# Patient Record
Sex: Male | Born: 1940
Health system: Southern US, Community
[De-identification: ages and names within clinical notes are randomized; demographics above are authoritative.]

## PROBLEM LIST (undated history)

## (undated) DIAGNOSIS — S065X9A Traumatic subdural hemorrhage with loss of consciousness of unspecified duration, initial encounter: Secondary | ICD-10-CM

## (undated) DIAGNOSIS — K5790 Diverticulosis of intestine, part unspecified, without perforation or abscess without bleeding: Secondary | ICD-10-CM

## (undated) DIAGNOSIS — I10 Essential (primary) hypertension: Secondary | ICD-10-CM

## (undated) HISTORY — PX: HERNIA REPAIR: SHX51

---

## 2004-02-22 ENCOUNTER — Ambulatory Visit (HOSPITAL_COMMUNITY): Admission: RE | Admit: 2004-02-22 | Discharge: 2004-02-22 | Payer: Self-pay | Admitting: General Surgery

## 2004-02-22 ENCOUNTER — Ambulatory Visit (HOSPITAL_BASED_OUTPATIENT_CLINIC_OR_DEPARTMENT_OTHER): Admission: RE | Admit: 2004-02-22 | Discharge: 2004-02-22 | Payer: Self-pay | Admitting: General Surgery

## 2007-07-26 ENCOUNTER — Encounter: Admission: RE | Admit: 2007-07-26 | Discharge: 2007-07-26 | Payer: Self-pay | Admitting: Family Medicine

## 2010-08-03 ENCOUNTER — Other Ambulatory Visit: Payer: Self-pay | Admitting: Otolaryngology

## 2010-08-03 DIAGNOSIS — R52 Pain, unspecified: Secondary | ICD-10-CM

## 2010-08-05 ENCOUNTER — Inpatient Hospital Stay: Admission: RE | Admit: 2010-08-05 | Payer: Self-pay | Source: Ambulatory Visit

## 2010-08-05 ENCOUNTER — Other Ambulatory Visit: Payer: Self-pay | Admitting: Otolaryngology

## 2010-08-05 ENCOUNTER — Ambulatory Visit
Admission: RE | Admit: 2010-08-05 | Discharge: 2010-08-05 | Disposition: A | Discharge status: Home / Self Care | Payer: Medicare Other | Source: Ambulatory Visit | Attending: Otolaryngology | Admitting: Otolaryngology

## 2010-08-05 DIAGNOSIS — R52 Pain, unspecified: Secondary | ICD-10-CM

## 2010-08-05 MED ORDER — IOHEXOL 300 MG/ML  SOLN
75.0000 mL | Freq: Once | INTRAMUSCULAR | Status: AC | PRN
  Administered 2010-08-05: 75 mL via INTRAVENOUS

## 2011-04-20 DIAGNOSIS — M542 Cervicalgia: Secondary | ICD-10-CM | POA: Diagnosis not present

## 2011-04-20 DIAGNOSIS — M5030 Other cervical disc degeneration, unspecified cervical region: Secondary | ICD-10-CM | POA: Diagnosis not present

## 2011-04-21 DIAGNOSIS — R509 Fever, unspecified: Secondary | ICD-10-CM | POA: Diagnosis not present

## 2011-04-24 ENCOUNTER — Other Ambulatory Visit: Payer: Self-pay | Admitting: Family Medicine

## 2011-04-24 ENCOUNTER — Ambulatory Visit
Admission: RE | Admit: 2011-04-24 | Discharge: 2011-04-24 | Disposition: A | Discharge status: Home / Self Care | Payer: Medicare Other | Source: Ambulatory Visit | Attending: Family Medicine | Admitting: Family Medicine

## 2011-04-24 DIAGNOSIS — R059 Cough, unspecified: Secondary | ICD-10-CM | POA: Diagnosis not present

## 2011-04-24 DIAGNOSIS — R509 Fever, unspecified: Secondary | ICD-10-CM

## 2011-04-24 DIAGNOSIS — R05 Cough: Secondary | ICD-10-CM | POA: Diagnosis not present

## 2011-04-24 DIAGNOSIS — J3489 Other specified disorders of nose and nasal sinuses: Secondary | ICD-10-CM | POA: Diagnosis not present

## 2011-04-25 DIAGNOSIS — R509 Fever, unspecified: Secondary | ICD-10-CM | POA: Diagnosis not present

## 2011-05-10 DIAGNOSIS — S065XAA Traumatic subdural hemorrhage with loss of consciousness status unknown, initial encounter: Secondary | ICD-10-CM

## 2011-05-10 DIAGNOSIS — S065X9A Traumatic subdural hemorrhage with loss of consciousness of unspecified duration, initial encounter: Secondary | ICD-10-CM

## 2011-05-10 HISTORY — DX: Traumatic subdural hemorrhage with loss of consciousness of unspecified duration, initial encounter: S06.5X9A

## 2011-05-10 HISTORY — DX: Traumatic subdural hemorrhage with loss of consciousness status unknown, initial encounter: S06.5XAA

## 2011-05-19 DIAGNOSIS — R51 Headache: Secondary | ICD-10-CM | POA: Diagnosis not present

## 2011-05-22 ENCOUNTER — Encounter (HOSPITAL_COMMUNITY): Payer: Self-pay | Admitting: Certified Registered Nurse Anesthetist

## 2011-05-22 ENCOUNTER — Emergency Department (HOSPITAL_COMMUNITY): Payer: Medicare Other

## 2011-05-22 ENCOUNTER — Encounter (HOSPITAL_COMMUNITY): Payer: Self-pay | Admitting: Emergency Medicine

## 2011-05-22 ENCOUNTER — Inpatient Hospital Stay (HOSPITAL_COMMUNITY)
Admission: EM | Admit: 2011-05-22 | Discharge: 2011-05-26 | DRG: 027 | Disposition: A | Discharge status: Home / Self Care | Payer: Medicare Other | Attending: Neurosurgery | Admitting: Neurosurgery

## 2011-05-22 ENCOUNTER — Encounter (HOSPITAL_COMMUNITY)
Admission: EM | Disposition: A | Discharge status: Home / Self Care | Payer: Self-pay | Source: Home / Self Care | Attending: Neurosurgery

## 2011-05-22 ENCOUNTER — Inpatient Hospital Stay (HOSPITAL_COMMUNITY): Payer: Medicare Other | Admitting: Certified Registered Nurse Anesthetist

## 2011-05-22 DIAGNOSIS — J45909 Unspecified asthma, uncomplicated: Secondary | ICD-10-CM | POA: Diagnosis present

## 2011-05-22 DIAGNOSIS — Z7982 Long term (current) use of aspirin: Secondary | ICD-10-CM | POA: Diagnosis not present

## 2011-05-22 DIAGNOSIS — I6200 Nontraumatic subdural hemorrhage, unspecified: Secondary | ICD-10-CM | POA: Diagnosis not present

## 2011-05-22 DIAGNOSIS — Z79899 Other long term (current) drug therapy: Secondary | ICD-10-CM | POA: Diagnosis not present

## 2011-05-22 DIAGNOSIS — S065X9A Traumatic subdural hemorrhage with loss of consciousness of unspecified duration, initial encounter: Secondary | ICD-10-CM | POA: Diagnosis not present

## 2011-05-22 DIAGNOSIS — R51 Headache: Secondary | ICD-10-CM | POA: Diagnosis not present

## 2011-05-22 DIAGNOSIS — S065XAA Traumatic subdural hemorrhage with loss of consciousness status unknown, initial encounter: Secondary | ICD-10-CM | POA: Diagnosis not present

## 2011-05-22 DIAGNOSIS — E119 Type 2 diabetes mellitus without complications: Secondary | ICD-10-CM | POA: Diagnosis not present

## 2011-05-22 DIAGNOSIS — I10 Essential (primary) hypertension: Secondary | ICD-10-CM | POA: Diagnosis present

## 2011-05-22 HISTORY — DX: Essential (primary) hypertension: I10

## 2011-05-22 HISTORY — DX: Diverticulosis of intestine, part unspecified, without perforation or abscess without bleeding: K57.90

## 2011-05-22 HISTORY — PX: BURR HOLE: SHX908

## 2011-05-22 LAB — CBC
HCT: 36.2 % — ABNORMAL LOW (ref 39.0–52.0)
HCT: 33.4 % — ABNORMAL LOW (ref 39.0–52.0)
Hemoglobin: 12 g/dL — ABNORMAL LOW (ref 13.0–17.0)
Hemoglobin: 11 g/dL — ABNORMAL LOW (ref 13.0–17.0)
MCH: 29.6 pg (ref 26.0–34.0)
MCH: 29.2 pg (ref 26.0–34.0)
MCHC: 33.1 g/dL (ref 30.0–36.0)
MCHC: 32.9 g/dL (ref 30.0–36.0)
MCV: 89.2 fL (ref 78.0–100.0)
MCV: 88.6 fL (ref 78.0–100.0)
Platelets: 197 10*3/uL (ref 150–400)
Platelets: 204 10*3/uL (ref 150–400)
RBC: 4.06 MIL/uL — ABNORMAL LOW (ref 4.22–5.81)
RBC: 3.77 MIL/uL — ABNORMAL LOW (ref 4.22–5.81)
RDW: 14.7 % (ref 11.5–15.5)
RDW: 14.7 % (ref 11.5–15.5)
WBC: 6.8 10*3/uL (ref 4.0–10.5)
WBC: 6.3 10*3/uL (ref 4.0–10.5)

## 2011-05-22 LAB — BASIC METABOLIC PANEL
BUN: 24 mg/dL — ABNORMAL HIGH (ref 6–23)
CO2: 25 mEq/L (ref 19–32)
Calcium: 9.9 mg/dL (ref 8.4–10.5)
Chloride: 98 mEq/L (ref 96–112)
Creatinine, Ser: 1.14 mg/dL (ref 0.50–1.35)
GFR calc Af Amer: 72 mL/min — ABNORMAL LOW (ref >90)
GFR calc non Af Amer: 62 mL/min — ABNORMAL LOW (ref >90)
Glucose, Bld: 146 mg/dL — ABNORMAL HIGH (ref 70–99)
Potassium: 4.4 mEq/L (ref 3.5–5.1)
Sodium: 135 mEq/L (ref 135–145)

## 2011-05-22 LAB — DIFFERENTIAL
Basophils Absolute: 0 10*3/uL (ref 0.0–0.1)
Basophils Relative: 0 % (ref 0–1)
Eosinophils Absolute: 0 10*3/uL (ref 0.0–0.7)
Eosinophils Relative: 0 % (ref 0–5)
Lymphocytes Relative: 9 % — ABNORMAL LOW (ref 12–46)
Lymphs Abs: 0.6 10*3/uL — ABNORMAL LOW (ref 0.7–4.0)
Monocytes Absolute: 0.3 10*3/uL (ref 0.1–1.0)
Monocytes Relative: 5 % (ref 3–12)
Neutro Abs: 5.8 10*3/uL (ref 1.7–7.7)
Neutrophils Relative %: 86 % — ABNORMAL HIGH (ref 43–77)

## 2011-05-22 LAB — PROTIME-INR
INR: 0.9 (ref 0.00–1.49)
INR: 0.96 (ref 0.00–1.49)
Prothrombin Time: 12.3 seconds (ref 11.6–15.2)
Prothrombin Time: 13 seconds (ref 11.6–15.2)

## 2011-05-22 LAB — GLUCOSE, CAPILLARY: Glucose-Capillary: 142 mg/dL — ABNORMAL HIGH (ref 70–99)

## 2011-05-22 LAB — APTT: aPTT: 30 seconds (ref 24–37)

## 2011-05-22 SURGERY — CREATION, CRANIAL BURR HOLE
Anesthesia: General | Site: Head | Laterality: Right | Wound class: Clean

## 2011-05-22 MED ORDER — LABETALOL HCL 5 MG/ML IV SOLN: 10.0000 mg | INTRAVENOUS | Status: DC | PRN

## 2011-05-22 MED ORDER — CEFAZOLIN SODIUM 1-5 GM-% IV SOLN
1.0000 g | Freq: Three times a day (TID) | INTRAVENOUS | Status: DC
  Administered 2011-05-22 – 2011-05-26 (×11): 1 g via INTRAVENOUS
  Filled 2011-05-22 (×16): qty 50

## 2011-05-22 MED ORDER — MICROFIBRILLAR COLL HEMOSTAT EX PADS
MEDICATED_PAD | CUTANEOUS | Status: DC | PRN
  Administered 2011-05-22: 1 via TOPICAL

## 2011-05-22 MED ORDER — ONDANSETRON HCL 4 MG/2ML IJ SOLN: 4.0000 mg | Freq: Once | INTRAMUSCULAR | Status: DC | PRN

## 2011-05-22 MED ORDER — NEOSTIGMINE METHYLSULFATE 1 MG/ML IJ SOLN
INTRAMUSCULAR | Status: DC | PRN
  Administered 2011-05-22: 4 mg via INTRAVENOUS
  Administered 2011-05-22: 1 mg via INTRAVENOUS

## 2011-05-22 MED ORDER — MORPHINE SULFATE 4 MG/ML IJ SOLN
4.0000 mg | Freq: Once | INTRAMUSCULAR | Status: AC
  Administered 2011-05-22: 4 mg via INTRAVENOUS
  Filled 2011-05-22: qty 1

## 2011-05-22 MED ORDER — SODIUM CHLORIDE 0.9 % IV SOLN
INTRAVENOUS | Status: AC
  Filled 2011-05-22: qty 500

## 2011-05-22 MED ORDER — ACETAMINOPHEN 650 MG RE SUPP: 650.0000 mg | RECTAL | Status: DC | PRN

## 2011-05-22 MED ORDER — EPHEDRINE SULFATE 50 MG/ML IJ SOLN
INTRAMUSCULAR | Status: DC | PRN
  Administered 2011-05-22: 10 mg via INTRAVENOUS
  Administered 2011-05-22: 5 mg via INTRAVENOUS
  Administered 2011-05-22 (×2): 10 mg via INTRAVENOUS

## 2011-05-22 MED ORDER — BACITRACIN ZINC 500 UNIT/GM EX OINT
TOPICAL_OINTMENT | CUTANEOUS | Status: DC | PRN
  Administered 2011-05-22: 1 via TOPICAL

## 2011-05-22 MED ORDER — ASPIRIN EC 81 MG PO TBEC
81.0000 mg | DELAYED_RELEASE_TABLET | Freq: Every day | ORAL | Status: DC
  Filled 2011-05-22 (×2): qty 1

## 2011-05-22 MED ORDER — LIDOCAINE-EPINEPHRINE 1 %-1:100000 IJ SOLN
INTRAMUSCULAR | Status: DC | PRN
  Administered 2011-05-22: 3 mL

## 2011-05-22 MED ORDER — ONDANSETRON HCL 4 MG/2ML IJ SOLN: 4.0000 mg | Freq: Four times a day (QID) | INTRAMUSCULAR | Status: DC | PRN

## 2011-05-22 MED ORDER — HYDROMORPHONE HCL PF 1 MG/ML IJ SOLN: 0.2500 mg | INTRAMUSCULAR | Status: DC | PRN

## 2011-05-22 MED ORDER — ALBUTEROL SULFATE HFA 108 (90 BASE) MCG/ACT IN AERS: 2.0000 | INHALATION_SPRAY | Freq: Four times a day (QID) | RESPIRATORY_TRACT | Status: DC | PRN

## 2011-05-22 MED ORDER — ATORVASTATIN CALCIUM 10 MG PO TABS
10.0000 mg | ORAL_TABLET | Freq: Every day | ORAL | Status: DC
  Administered 2011-05-24 – 2011-05-25 (×2): 10 mg via ORAL
  Filled 2011-05-22 (×4): qty 1

## 2011-05-22 MED ORDER — LORATADINE 10 MG PO TABS
10.0000 mg | ORAL_TABLET | Freq: Every day | ORAL | Status: DC
  Administered 2011-05-23 – 2011-05-26 (×4): 10 mg via ORAL
  Filled 2011-05-22 (×4): qty 1

## 2011-05-22 MED ORDER — SITAGLIPTIN PHOS-METFORMIN HCL 50-1000 MG PO TABS: 1.0000 | ORAL_TABLET | Freq: Two times a day (BID) | ORAL | Status: DC

## 2011-05-22 MED ORDER — ADULT MULTIVITAMIN W/MINERALS CH
1.0000 | ORAL_TABLET | Freq: Every day | ORAL | Status: DC
  Administered 2011-05-23 – 2011-05-26 (×4): 1 via ORAL
  Filled 2011-05-22 (×4): qty 1

## 2011-05-22 MED ORDER — GLYCOPYRROLATE 0.2 MG/ML IJ SOLN
INTRAMUSCULAR | Status: DC | PRN
  Administered 2011-05-22: .2 mg via INTRAVENOUS
  Administered 2011-05-22: .6 mg via INTRAVENOUS

## 2011-05-22 MED ORDER — LOSARTAN POTASSIUM 50 MG PO TABS
50.0000 mg | ORAL_TABLET | Freq: Every day | ORAL | Status: DC
  Administered 2011-05-22 – 2011-05-26 (×4): 50 mg via ORAL
  Filled 2011-05-22 (×5): qty 1

## 2011-05-22 MED ORDER — METFORMIN HCL 500 MG PO TABS
1000.0000 mg | ORAL_TABLET | Freq: Two times a day (BID) | ORAL | Status: DC
  Administered 2011-05-23 – 2011-05-26 (×6): 1000 mg via ORAL
  Filled 2011-05-22 (×9): qty 2

## 2011-05-22 MED ORDER — FENTANYL CITRATE 0.05 MG/ML IJ SOLN
INTRAMUSCULAR | Status: DC | PRN
  Administered 2011-05-22: 100 ug via INTRAVENOUS

## 2011-05-22 MED ORDER — BACITRACIN 50000 UNITS IM SOLR
INTRAMUSCULAR | Status: DC | PRN
  Administered 2011-05-22: 19:00:00

## 2011-05-22 MED ORDER — DEXAMETHASONE SODIUM PHOSPHATE 10 MG/ML IJ SOLN
10.0000 mg | Freq: Once | INTRAMUSCULAR | Status: DC
  Filled 2011-05-22: qty 1

## 2011-05-22 MED ORDER — AMOXICILLIN-POT CLAVULANATE 875-125 MG PO TABS
1.0000 | ORAL_TABLET | Freq: Two times a day (BID) | ORAL | Status: DC
  Filled 2011-05-22 (×4): qty 1

## 2011-05-22 MED ORDER — CEFAZOLIN SODIUM 1-5 GM-% IV SOLN
INTRAVENOUS | Status: AC
  Filled 2011-05-22: qty 50

## 2011-05-22 MED ORDER — SODIUM CHLORIDE 0.9 % IV SOLN
INTRAVENOUS | Status: DC | PRN
  Administered 2011-05-22: 19:00:00 via INTRAVENOUS

## 2011-05-22 MED ORDER — NAPROXEN SODIUM 550 MG PO TABS
550.0000 mg | ORAL_TABLET | Freq: Two times a day (BID) | ORAL | Status: DC
  Filled 2011-05-22 (×4): qty 1

## 2011-05-22 MED ORDER — GLIPIZIDE ER 5 MG PO TB24
5.0000 mg | ORAL_TABLET | Freq: Every day | ORAL | Status: DC
  Administered 2011-05-23 – 2011-05-26 (×4): 5 mg via ORAL
  Filled 2011-05-22 (×4): qty 1

## 2011-05-22 MED ORDER — 0.9 % SODIUM CHLORIDE (POUR BTL) OPTIME
TOPICAL | Status: DC | PRN
  Administered 2011-05-22 (×2): 1000 mL

## 2011-05-22 MED ORDER — ACETAMINOPHEN 325 MG PO TABS
325.0000 mg | ORAL_TABLET | Freq: Four times a day (QID) | ORAL | Status: DC | PRN
  Administered 2011-05-23 – 2011-05-24 (×2): 325 mg via ORAL
  Filled 2011-05-22: qty 1

## 2011-05-22 MED ORDER — METOCLOPRAMIDE HCL 5 MG/ML IJ SOLN
10.0000 mg | Freq: Once | INTRAMUSCULAR | Status: DC
  Filled 2011-05-22: qty 2

## 2011-05-22 MED ORDER — CEFAZOLIN SODIUM 1 G IJ SOLR: 1.0000 g | Freq: Three times a day (TID) | INTRAMUSCULAR | Status: DC

## 2011-05-22 MED ORDER — PHENYLEPHRINE HCL 10 MG/ML IJ SOLN
INTRAMUSCULAR | Status: DC | PRN
  Administered 2011-05-22 (×2): 80 ug via INTRAVENOUS

## 2011-05-22 MED ORDER — PROPOFOL 10 MG/ML IV EMUL
INTRAVENOUS | Status: DC | PRN
  Administered 2011-05-22: 20 mg via INTRAVENOUS
  Administered 2011-05-22: 110 mg via INTRAVENOUS

## 2011-05-22 MED ORDER — ROCURONIUM BROMIDE 100 MG/10ML IV SOLN
INTRAVENOUS | Status: DC | PRN
  Administered 2011-05-22: 40 mg via INTRAVENOUS

## 2011-05-22 MED ORDER — THROMBIN 20000 UNITS EX KIT
PACK | CUTANEOUS | Status: DC | PRN
  Administered 2011-05-22: 20:00:00 via TOPICAL

## 2011-05-22 MED ORDER — PANTOPRAZOLE SODIUM 40 MG IV SOLR
40.0000 mg | Freq: Every day | INTRAVENOUS | Status: DC
  Administered 2011-05-22 – 2011-05-23 (×2): 40 mg via INTRAVENOUS
  Filled 2011-05-22 (×3): qty 40

## 2011-05-22 MED ORDER — ACETAMINOPHEN 325 MG PO TABS: 650.0000 mg | ORAL_TABLET | ORAL | Status: DC | PRN

## 2011-05-22 MED ORDER — LIDOCAINE HCL (CARDIAC) 20 MG/ML IV SOLN
INTRAVENOUS | Status: DC | PRN
  Administered 2011-05-22: 60 mg via INTRAVENOUS

## 2011-05-22 MED ORDER — BACITRACIN 50000 UNITS IM SOLR
INTRAMUSCULAR | Status: AC
  Filled 2011-05-22: qty 1

## 2011-05-22 MED ORDER — LINAGLIPTIN 5 MG PO TABS
5.0000 mg | ORAL_TABLET | Freq: Every day | ORAL | Status: DC
  Administered 2011-05-23 – 2011-05-26 (×4): 5 mg via ORAL
  Filled 2011-05-22 (×4): qty 1

## 2011-05-22 MED ORDER — LIDOCAINE HCL 4 % MT SOLN
OROMUCOSAL | Status: DC | PRN
  Administered 2011-05-22: 4 mL via TOPICAL

## 2011-05-22 MED ORDER — LEVOTHYROXINE SODIUM 75 MCG PO TABS
75.0000 ug | ORAL_TABLET | Freq: Every day | ORAL | Status: DC
  Administered 2011-05-23 – 2011-05-26 (×4): 75 ug via ORAL
  Filled 2011-05-22 (×4): qty 1

## 2011-05-22 MED ORDER — SODIUM CHLORIDE 0.9 % IV BOLUS (SEPSIS)
1000.0000 mL | Freq: Once | INTRAVENOUS | Status: AC
  Administered 2011-05-22: 1000 mL via INTRAVENOUS

## 2011-05-22 MED ORDER — SENNOSIDES-DOCUSATE SODIUM 8.6-50 MG PO TABS
1.0000 | ORAL_TABLET | Freq: Two times a day (BID) | ORAL | Status: DC
  Administered 2011-05-22 – 2011-05-26 (×7): 1 via ORAL
  Filled 2011-05-22 (×8): qty 1

## 2011-05-22 MED ORDER — PIOGLITAZONE HCL 30 MG PO TABS
30.0000 mg | ORAL_TABLET | Freq: Every day | ORAL | Status: DC
Start: 2011-05-23 — End: 2011-05-26
  Administered 2011-05-23 – 2011-05-26 (×4): 30 mg via ORAL
  Filled 2011-05-22 (×4): qty 1

## 2011-05-22 MED ORDER — DIPHENHYDRAMINE HCL 50 MG/ML IJ SOLN
25.0000 mg | Freq: Once | INTRAMUSCULAR | Status: DC
  Filled 2011-05-22: qty 1

## 2011-05-22 SURGICAL SUPPLY — 76 items
ADH SKN CLS APL DERMABOND .7 (GAUZE/BANDAGES/DRESSINGS) ×1
BAG DECANTER FOR FLEXI CONT (MISCELLANEOUS) ×2 IMPLANT
BANDAGE GAUZE 4  KLING STR (GAUZE/BANDAGES/DRESSINGS) IMPLANT
BLADE SURG 11 STRL SS (BLADE) ×1 IMPLANT
BLADE SURG ROTATE 9660 (MISCELLANEOUS) ×1 IMPLANT
BNDG COHESIVE 4X5 TAN NS LF (GAUZE/BANDAGES/DRESSINGS) IMPLANT
BRUSH SCRUB EZ PLAIN DRY (MISCELLANEOUS) ×2 IMPLANT
BUR ACORN 9.0 PRECISION (BURR) ×2 IMPLANT
BUR ADDG 1.1 (BURR) IMPLANT
CANISTER SUCTION 2500CC (MISCELLANEOUS) ×3 IMPLANT
CLIP TI MEDIUM 6 (CLIP) IMPLANT
CLOTH BEACON ORANGE TIMEOUT ST (SAFETY) ×2 IMPLANT
CONT SPEC 4OZ CLIKSEAL STRL BL (MISCELLANEOUS) ×2 IMPLANT
CORDS BIPOLAR (ELECTRODE) ×2 IMPLANT
DECANTER SPIKE VIAL GLASS SM (MISCELLANEOUS) ×2 IMPLANT
DERMABOND ADVANCED (GAUZE/BANDAGES/DRESSINGS) ×1
DERMABOND ADVANCED .7 DNX12 (GAUZE/BANDAGES/DRESSINGS) ×1 IMPLANT
DRAPE NEUROLOGICAL W/INCISE (DRAPES) ×1 IMPLANT
DRAPE SURG 17X23 STRL (DRAPES) IMPLANT
DRAPE WARM FLUID 44X44 (DRAPE) ×2 IMPLANT
DRSG OPSITE 4X5.5 SM (GAUZE/BANDAGES/DRESSINGS) ×3 IMPLANT
ELECT CAUTERY BLADE 6.4 (BLADE) ×2 IMPLANT
ELECT REM PT RETURN 9FT ADLT (ELECTROSURGICAL) ×2
ELECTRODE REM PT RTRN 9FT ADLT (ELECTROSURGICAL) ×1 IMPLANT
GAUZE SPONGE 4X4 16PLY XRAY LF (GAUZE/BANDAGES/DRESSINGS) IMPLANT
GLOVE BIO SURGEON STRL SZ 6.5 (GLOVE) ×3 IMPLANT
GLOVE BIO SURGEON STRL SZ7 (GLOVE) IMPLANT
GLOVE BIO SURGEON STRL SZ7.5 (GLOVE) IMPLANT
GLOVE BIO SURGEON STRL SZ8 (GLOVE) ×2 IMPLANT
GLOVE BIO SURGEON STRL SZ8.5 (GLOVE) IMPLANT
GLOVE BIOGEL M 8.0 STRL (GLOVE) IMPLANT
GLOVE ECLIPSE 6.5 STRL STRAW (GLOVE) IMPLANT
GLOVE ECLIPSE 7.0 STRL STRAW (GLOVE) IMPLANT
GLOVE ECLIPSE 7.5 STRL STRAW (GLOVE) IMPLANT
GLOVE ECLIPSE 8.0 STRL XLNG CF (GLOVE) IMPLANT
GLOVE ECLIPSE 8.5 STRL (GLOVE) IMPLANT
GLOVE EXAM NITRILE LRG STRL (GLOVE) IMPLANT
GLOVE EXAM NITRILE MD LF STRL (GLOVE) ×1 IMPLANT
GLOVE EXAM NITRILE XL STR (GLOVE) IMPLANT
GLOVE EXAM NITRILE XS STR PU (GLOVE) IMPLANT
GLOVE INDICATOR 6.5 STRL GRN (GLOVE) ×2 IMPLANT
GLOVE INDICATOR 7.0 STRL GRN (GLOVE) IMPLANT
GLOVE INDICATOR 7.5 STRL GRN (GLOVE) IMPLANT
GLOVE INDICATOR 8.0 STRL GRN (GLOVE) IMPLANT
GLOVE INDICATOR 8.5 STRL (GLOVE) ×2 IMPLANT
GLOVE OPTIFIT SS 8.0 STRL (GLOVE) IMPLANT
GLOVE SURG SS PI 6.5 STRL IVOR (GLOVE) IMPLANT
GOWN BRE IMP SLV AUR LG STRL (GOWN DISPOSABLE) ×2 IMPLANT
GOWN BRE IMP SLV AUR XL STRL (GOWN DISPOSABLE) ×2 IMPLANT
GOWN STRL REIN 2XL LVL4 (GOWN DISPOSABLE) ×2 IMPLANT
HEMOSTAT SURGICEL 2X14 (HEMOSTASIS) ×1 IMPLANT
HOOK DURA (MISCELLANEOUS) ×1 IMPLANT
KIT BASIN OR (CUSTOM PROCEDURE TRAY) ×2 IMPLANT
KIT ROOM TURNOVER OR (KITS) ×2 IMPLANT
NDL HYPO 25X1 1.5 SAFETY (NEEDLE) ×1 IMPLANT
NEEDLE HYPO 25X1 1.5 SAFETY (NEEDLE) ×2 IMPLANT
NS IRRIG 1000ML POUR BTL (IV SOLUTION) ×3 IMPLANT
PACK CRANIOTOMY (CUSTOM PROCEDURE TRAY) ×2 IMPLANT
PAD ARMBOARD 7.5X6 YLW CONV (MISCELLANEOUS) ×6 IMPLANT
PATTIES SURGICAL .25X.25 (GAUZE/BANDAGES/DRESSINGS) IMPLANT
PATTIES SURGICAL .5 X.5 (GAUZE/BANDAGES/DRESSINGS) IMPLANT
PATTIES SURGICAL .5 X3 (DISPOSABLE) IMPLANT
PATTIES SURGICAL 1X1 (DISPOSABLE) IMPLANT
PIN MAYFIELD SKULL DISP (PIN) IMPLANT
SPONGE GAUZE 4X4 12PLY (GAUZE/BANDAGES/DRESSINGS) ×2 IMPLANT
SPONGE NEURO XRAY DETECT 1X3 (DISPOSABLE) IMPLANT
SPONGE SURGIFOAM ABS GEL 100 (HEMOSTASIS) ×1 IMPLANT
STAPLER VISISTAT 35W (STAPLE) ×2 IMPLANT
SUT NURALON 4 0 TR CR/8 (SUTURE) ×2 IMPLANT
SUT VIC AB 2-0 CT1 18 (SUTURE) ×2 IMPLANT
SYR 20ML ECCENTRIC (SYRINGE) ×2 IMPLANT
SYR CONTROL 10ML LL (SYRINGE) ×2 IMPLANT
TOWEL OR 17X24 6PK STRL BLUE (TOWEL DISPOSABLE) ×2 IMPLANT
TOWEL OR 17X26 10 PK STRL BLUE (TOWEL DISPOSABLE) ×2 IMPLANT
TRAY FOLEY CATH 14FRSI W/METER (CATHETERS) ×1 IMPLANT
WATER STERILE IRR 1000ML POUR (IV SOLUTION) ×2 IMPLANT

## 2011-05-22 NOTE — ED Provider Notes (Signed)
History     CSN: [id number]  Arrival date & time 05/22/11  1118   First MD Initiated Contact with Patient 05/22/11 1232      Chief Complaint  Patient presents with  . Headache    (Consider location/radiation/quality/duration/timing/severity/associated sxs/prior treatment) HPI History provided by pt.   Pt has had a constant, progressively worsening headache x 6 days.  Pain diffuse but worst in frontal region.  Aggravated by bending over.  Associated w/ dizziness, described as feeling off balance, today.  His family noted him to be unsteady on his feet.  Denies blurred vision, dysarthria, dysphagia, confusion, extremity weakness/paresthesias.  No fever, nasal congestion, rhinorrhea, rash.  His family reports nml mentation.  Pt seen by his PCP 4 days ago and prescribed augmentin and naproxen for possible sinusitis.  Pt has had no relief w/ these medications.  Was seen again this morning and referred to ED for CT.  Patient's daughter an endocrinologist and she would like for patient to have an LP.  He returned from [place] a little over a month ago and had a viral illness for approx 2wks afterwards that his PCP treated w/ cipro. No recent head trauma.  No h/o headaches.  Is not anti-coagulated.    Past Medical History  Diagnosis Date  . Diabetes mellitus   . Hypertension   . Asthma   . Diverticular disease     Past Surgical History  Procedure Date  . Hernia repair     No family history on file.  History  Substance Use Topics  . Smoking status: Former Games developer  . Smokeless tobacco: Not on file  . Alcohol Use: No      Review of Systems  All other systems reviewed and are negative.    Allergies  Review of patient's allergies indicates no known allergies.  Home Medications   Current Outpatient Rx  Name Route Sig Dispense Refill  . ACETAMINOPHEN 325 MG PO TABS Oral Take 325 mg by mouth every 6 (six) hours as needed. For pain    . ALBUTEROL SULFATE HFA 108 (90 BASE)  MCG/ACT IN AERS Inhalation Inhale 2 puffs into the lungs every 6 (six) hours as needed. For shortness of breath    . AMOXICILLIN-POT CLAVULANATE 875-125 MG PO TABS Oral Take 1 tablet by mouth 2 (two) times daily.    . ASPIRIN EC 81 MG PO TBEC Oral Take 81 mg by mouth daily.    [name] Kitchen CLOBETASOL PROPIONATE 0.05 % EX GEL Topical Apply 1 application topically daily as needed. To affected area    . FEXOFENADINE HCL 180 MG PO TABS Oral Take 180 mg by mouth daily as needed. For allergies    . GLIPIZIDE ER 5 MG PO TB24 Oral Take 5 mg by mouth daily.    [name] Kitchen LEVOTHYROXINE SODIUM 75 MCG PO TABS Oral Take 75 mcg by mouth daily.    [name] Kitchen LOSARTAN POTASSIUM 50 MG PO TABS Oral Take 50 mg by mouth daily.    . ADULT MULTIVITAMIN W/MINERALS CH Oral Take 1 tablet by mouth daily.    [name] Kitchen NAPROXEN SODIUM 220 MG PO TABS Oral Take 220 mg by mouth 2 (two) times daily with a meal. For pain    . NAPROXEN SODIUM 550 MG PO TABS Oral Take 550 mg by mouth 2 (two) times daily with a meal.    . PIOGLITAZONE HCL 30 MG PO TABS Oral Take 30 mg by mouth daily.    [name] Kitchen ROSUVASTATIN CALCIUM 5 MG PO TABS Oral  Take 5 mg by mouth daily.    [name] Kitchen SITAGLIPTIN-METFORMIN HCL 50-1000 MG PO TABS Oral Take 1 tablet by mouth 2 (two) times daily with a meal.      BP 129/71  Pulse 78  Temp(Src) 97.7 F (36.5 C) (Oral)  Resp 20  SpO2 100%  Physical Exam  Nursing note and vitals reviewed. Constitutional: He is oriented to person, place, and time. He appears well-developed and well-nourished. No distress.  HENT:  Head: Normocephalic and atraumatic.       Tenderness of entire frontal scalp, including frontal sinuses and temples.  Temporal arteries are not palpable.  No jaw claudication.   Eyes:       Normal appearance  Neck: Normal range of motion.       No meningeal signs but patient reports that pain is aggravated by both neck and bilateral hip flexion.   Cardiovascular: Normal rate, regular rhythm and intact distal pulses.   Pulmonary/Chest: Effort  normal and breath sounds normal.  Musculoskeletal: Normal range of motion.  Neurological: He is alert and oriented to person, place, and time. No sensory deficit. Coordination normal.       CN 3-12 intact.  No nystagmus. 5/5 and equal upper and lower extremity strength.  No past pointing.     Skin: Skin is warm and dry. No rash noted.  Psychiatric: He has a normal mood and affect. His behavior is normal.    ED Course  Procedures (including critical care time)  Labs Reviewed  GLUCOSE, CAPILLARY - Abnormal; Notable for the following:    Glucose-Capillary 142 (*)    All other components within normal limits  CBC - Abnormal; Notable for the following:    RBC 4.06 (*)    Hemoglobin 12.0 (*)    HCT 36.2 (*)    All other components within normal limits  DIFFERENTIAL - Abnormal; Notable for the following:    Neutrophils Relative 86 (*)    Lymphocytes Relative 9 (*)    Lymphs Abs 0.6 (*)    All other components within normal limits  APTT  PROTIME-INR  BASIC METABOLIC PANEL   Ct Head Wo Contrast  05/22/2011  *RADIOLOGY REPORT*  Clinical Data: Progressive headaches for 1 month.  Recent travel to [place].  On antibiotic therapy.  No history of trauma or anticoagulation.  CT HEAD WITHOUT CONTRAST  Technique:  Contiguous axial images were obtained from the base of the skull through the vertex without contrast.  Comparison: CT temporal bone 08/05/2010.  Findings: There is a nearly isodense subdural hematoma on the right layering over the right frontal, temporal and parietal lobes.  This measures up to 1.3 cm in thickness. There is a suspected smaller contralateral subdural hematoma measuring up to 6 mm in diameter. There is only 3 mm of right-to-left midline shift.  However, there is significant effacement of the subarachnoid spaces bilaterally. There is no hydrocephalus or evidence of intraparenchymal hematoma. There is evidence of acute infarction.  The visualized paranasal sinuses are clear.  The  calvarium is intact.  IMPRESSION: Suspected right greater than left bilateral subdural hematomas, nearly isodense to cerebral cortex with associated mass effect on the gyri and effacement of the sulci.  There is mild midline shift. Brain MRI may be helpful to confirm the bilateral nature of this process.  Neurosurgical consultation recommended.  Critical Value/emergent results were called by telephone at the time of interpretation on 05/22/2011  at 1405 hours  to  [facility], who verbally acknowledged these results.  Original Report Authenticated By: Gerrianne Scale, M.D.     1. Subdural hematoma       MDM  70yo M presents w/ progressively worsening headache, worst in frontal region, for the past 6 days.  No trauma.  Afebrile, no focal neuro deficits or meningeal signs on exam.  CT head shows nearly isodense, bilateral subdural hematomas, right worse than left, with mild mid-line shift.  Coags w/in nml range.  Results discussed w/ pt.  Consulted Dr. Wynetta Emery w/ NS and he will admit patient and take to OR for burr holes.  Pt has had some relief of pain w/ morphine.         Arie Sabina [place], [place] 05/23/11 [phone]

## 2011-05-22 NOTE — ED Notes (Signed)
3102-01 Ready 

## 2011-05-22 NOTE — Anesthesia Procedure Notes (Signed)
Procedure Name: Intubation Date/Time: 05/22/2011 7:21 PM Performed by: Glendora Score A Pre-anesthesia Checklist: Patient identified, Emergency Drugs available, Suction available and Patient being monitored Patient Re-evaluated:Patient Re-evaluated prior to inductionOxygen Delivery Method: Circle system utilized Preoxygenation: Pre-oxygenation with 100% oxygen Intubation Type: IV induction Ventilation: Mask ventilation without difficulty and Oral airway inserted - appropriate to patient size Laryngoscope Size: Hyacinth Meeker and 2 Grade View: Grade I Tube type: Oral Tube size: 8.0 mm Number of attempts: 1 Airway Equipment and Method: Stylet and LTA kit utilized Placement Confirmation: ETT inserted through vocal cords under direct vision,  positive ETCO2 and breath sounds checked- equal and bilateral Secured at: 23 cm Tube secured with: Tape Dental Injury: Teeth and Oropharynx as per pre-operative assessment

## 2011-05-22 NOTE — Anesthesia Postprocedure Evaluation (Signed)
  Anesthesia Post-op Note  Patient: James Juarez  Procedure(s) Performed: Procedure(s) (LRB): BURR HOLES (Right)  Patient Location: PACU  Anesthesia Type: General  Level of Consciousness: awake, alert  and oriented  Airway and Oxygen Therapy: Patient Spontanous Breathing and Patient connected to nasal cannula oxygen  Post-op Pain: none  Post-op Assessment: Post-op Vital signs reviewed and Patient's Cardiovascular Status Stable  Post-op Vital Signs: stable  Complications: No apparent anesthesia complications

## 2011-05-22 NOTE — Preoperative (Signed)
Beta Blockers   Reason not to administer Beta Blockers:Not Applicable 

## 2011-05-22 NOTE — ED Notes (Signed)
Had traveled to [place] about 1 mont ago had a fever and chills and h/a and body aces has been on antibiotics x 2 but still having headache has gotten worse ,dizzy  went to see his dr today and  Was sent for further tests

## 2011-05-22 NOTE — Transfer of Care (Signed)
Immediate Anesthesia Transfer of Care Note  Patient: James Juarez  Procedure(s) Performed: Procedure(s) (LRB): BURR HOLES (Right)  Patient Location: PACU  Anesthesia Type: General  Level of Consciousness: awake and patient cooperative  Airway & Oxygen Therapy: Patient Spontanous Breathing and Patient connected to nasal cannula oxygen  Post-op Assessment: Report given to PACU RN  Post vital signs: Reviewed and stable  Complications: No apparent anesthesia complications

## 2011-05-22 NOTE — Op Note (Signed)
Preoperative diagnosis: Right subacute subdural hematoma  Postoperative diagnosis: Same   Procedure: Ines Bloomer hole craniectomy for evacuation of right-sided subacute subdural hematoma placement of a 7 mm JP drain  Surgeon: Jillyn Hidden Nekeisha Aure  Anesthesia: General  EBL: Minimal  History of present illness: Patient is a 29 or gentleman presented emergent R. with headaches progress to her so the last several days this in the ER with with a CT scan showed a large subacute subdural hematoma with midline shift the patient recommended burr hole craniectomy extensively the risks benefits of the operation with the patient and family as well as perioperative course and expectations of outcome and alternatives to surgery and administered a grade to proceed forward.  Operative procedure: Patient brought into the or was induced under general anesthesia and positioned supine with a shoulder bump under his right shoulder his head turned the left exposing the right convexity to burr hole sites were drawn out and infiltrated with 10 cc lidocaine with epi after the head was shaved prepped and draped in routine sterile fashion. Were also drilled a high-speed drill dura was then coagulated and incised in a cruciate fashion her at first incising the posterior parietal bur hole large amount of dark appearing old blood came out under pressure it was a subdural membrane visualized underneath it this was coagulated and incised in so I could visualize cortex. Frontal bur hole was then opened up and a similar fashion also incising the subdural membranes I could visualize the  right frontal lobe. Then using a rib catheter irrigated the front suctioned from the parietal in the sub-be corrected that she was irrigated until clear irrigant came out and no more blood is appreciated then a J-P drain was cut sized and passed over a 3 [place] to confirm migration only and the subdural space and this point posterior parietal hole was closed with  interrupted vertical staples in the left frontal burr hole was irrigated Gelfoam was laid the defect and this was also closed with after Vicryl's and staples then dressed the patient recovered in stable condition at the end of case on it counts sponge counts were correct per the nurses.

## 2011-05-22 NOTE — Anesthesia Preprocedure Evaluation (Addendum)
Anesthesia Evaluation  Patient identified by MRN, date of birth, ID band Patient awake    Reviewed: Allergy & Precautions, H&P , NPO status , Patient's Chart, lab work & pertinent test results  Airway Mallampati: I TM Distance: >3 FB Neck ROM: Full    Dental  (+) Partial Lower and Partial Upper   Pulmonary asthma ,  breath sounds clear to auscultation        Cardiovascular hypertension, Rhythm:Regular Rate:Normal     Neuro/Psych    GI/Hepatic   Endo/Other  Diabetes mellitus-, Type 2  Renal/GU      Musculoskeletal   Abdominal   Peds  Hematology   Anesthesia Other Findings   Reproductive/Obstetrics                         Anesthesia Physical Anesthesia Plan  ASA: III and Emergent  Anesthesia Plan: General   Post-op Pain Management:    Induction: Intravenous and Rapid sequence  Airway Management Planned: Oral ETT  Additional Equipment:   Intra-op Plan:   Post-operative Plan: Extubation in OR and Possible Post-op intubation/ventilation  Informed Consent: I have reviewed the patients History and Physical, chart, labs and discussed the procedure including the risks, benefits and alternatives for the proposed anesthesia with the patient or authorized representative who has indicated his/her understanding and acceptance.   Dental advisory given  Plan Discussed with: CRNA, Anesthesiologist and Surgeon  Anesthesia Plan Comments: (Type 2 DM glucose 141 Htn Bilat subdural hematomas R>L with mild midline shift H/O asthma, lungs clear  Plan GA with ETT  Kipp Brood, MD)      Anesthesia Quick Evaluation

## 2011-05-22 NOTE — Progress Notes (Signed)
Pt transported to neuro PACU with RN on monitor. Report given to [facility] CRNA at bedside. Pt chatting with family. [place], [place] M

## 2011-05-22 NOTE — H&P (Signed)
James Juarez is an 70 y.o. male.    Chief Complaint: Headaches HPI: Patient is 71 year old gentleman who over the last several days been having severe headaches are predominately right-sided also occipital he initially saw his primary care physician was treated with Naprosyn and Augmentin for sinusitis over the headaches progressed especially bad Sunday night, when saw his primary care physician again today who told him this was not signs in either the emergency room and evaluated. In the emergency room obtain a CT of his head which showed bilateral chronic compression subacute to chronic subdural worse on the right. Patient currently denies any nausea or vomiting any numbness or tingling he has been dizzy but other than dizziness no problems with ambulation.  Past Medical History  Diagnosis Date  . Diabetes mellitus   . Hypertension   . Asthma   . Diverticular disease     Past Surgical History  Procedure Date  . Hernia repair     No family history on file. Social History:  reports that he has quit smoking. He does not have any smokeless tobacco history on file. He reports that he does not drink alcohol. His drug history not on file.  Allergies: No Known Allergies   (Not in a hospital admission)  Results for orders placed during the hospital encounter of 05/22/11 (from the past 48 hour(s))  GLUCOSE, CAPILLARY     Status: Abnormal   Collection Time   05/22/11 12:56 PM      Component Value Range Comment   Glucose-Capillary 142 (*) 70 - 99 (mg/dL)   CBC     Status: Abnormal   Collection Time   05/22/11  2:16 PM      Component Value Range Comment   WBC 6.8  4.0 - 10.5 (K/uL)    RBC 4.06 (*) 4.22 - 5.81 (MIL/uL)    Hemoglobin 12.0 (*) 13.0 - 17.0 (g/dL)    HCT 81.1 (*) 91.4 - 52.0 (%)    MCV 89.2  78.0 - 100.0 (fL)    MCH 29.6  26.0 - 34.0 (pg)    MCHC 33.1  30.0 - 36.0 (g/dL)    RDW 78.2  95.6 - 21.3 (%)    Platelets 197  150 - 400 (K/uL)   DIFFERENTIAL     Status: Abnormal   Collection Time   05/22/11  2:16 PM      Component Value Range Comment   Neutrophils Relative 86 (*) 43 - 77 (%)    Neutro Abs 5.8  1.7 - 7.7 (K/uL)    Lymphocytes Relative 9 (*) 12 - 46 (%)    Lymphs Abs 0.6 (*) 0.7 - 4.0 (K/uL)    Monocytes Relative 5  3 - 12 (%)    Monocytes Absolute 0.3  0.1 - 1.0 (K/uL)    Eosinophils Relative 0  0 - 5 (%)    Eosinophils Absolute 0.0  0.0 - 0.7 (K/uL)    Basophils Relative 0  0 - 1 (%)    Basophils Absolute 0.0  0.0 - 0.1 (K/uL)   BASIC METABOLIC PANEL     Status: Abnormal   Collection Time   05/22/11  2:16 PM      Component Value Range Comment   Sodium 135  135 - 145 (mEq/L)    Potassium 4.4  3.5 - 5.1 (mEq/L)    Chloride 98  96 - 112 (mEq/L)    CO2 25  19 - 32 (mEq/L)    Glucose, Bld 146 (*) 70 -  99 (mg/dL)    BUN 24 (*) 6 - 23 (mg/dL)    Creatinine, Ser 1.61  0.50 - 1.35 (mg/dL)    Calcium 9.9  8.4 - 10.5 (mg/dL)    GFR calc non Af Amer 62 (*) >90 (mL/min)    GFR calc Af Amer 72 (*) >90 (mL/min)   APTT     Status: Normal   Collection Time   05/22/11  2:16 PM      Component Value Range Comment   aPTT 30  24 - 37 (seconds)   PROTIME-INR     Status: Normal   Collection Time   05/22/11  2:16 PM      Component Value Range Comment   Prothrombin Time 12.3  11.6 - 15.2 (seconds)    INR 0.90  0.00 - 1.49     Ct Head Wo Contrast  05/22/2011  *RADIOLOGY REPORT*  Clinical Data: Progressive headaches for 1 month.  Recent travel to [place].  On antibiotic therapy.  No history of trauma or anticoagulation.  CT HEAD WITHOUT CONTRAST  Technique:  Contiguous axial images were obtained from the base of the skull through the vertex without contrast.  Comparison: CT temporal bone 08/05/2010.  Findings: There is a nearly isodense subdural hematoma on the right layering over the right frontal, temporal and parietal lobes.  This measures up to 1.3 cm in thickness. There is a suspected smaller contralateral subdural hematoma measuring up to 6 mm in diameter.  There is only 3 mm of right-to-left midline shift.  However, there is significant effacement of the subarachnoid spaces bilaterally. There is no hydrocephalus or evidence of intraparenchymal hematoma. There is evidence of acute infarction.  The visualized paranasal sinuses are clear.  The calvarium is intact.  IMPRESSION: Suspected right greater than left bilateral subdural hematomas, nearly isodense to cerebral cortex with associated mass effect on the gyri and effacement of the sulci.  There is mild midline shift. Brain MRI may be helpful to confirm the bilateral nature of this process.  Neurosurgical consultation recommended.  Critical Value/emergent results were called by telephone at the time of interpretation on 05/22/2011  at 1405 hours  to  [facility], who verbally acknowledged these results.  Original Report Authenticated By: Gerrianne Scale, M.D.    ROS he patient denies nausea vomiting denies numbness or tingling positive for dizziness positive for headaches  Blood pressure 125/65, pulse 65, temperature 97.7 F (36.5 C), temperature source Oral, resp. rate 20, SpO2 96.00%. Physical Exam patient is awake alert and oriented x4 pupils are equal and reactive exit was intact cranial nerves are intact strength is 5 out of 5 in his upper and lower extremities and no evidence of pronator drift I did not ambulate the patient reflexes are normal and symmetric. HEENT cardiac lung and abdominal exam are within normal limits.  Assessment/Plan 68 or gentleman presents with headaches and CT showing bilateral subacute subdural hematomas the left one is not clinically significant this point the right one is large at the because midline shift I recommended right-sided bur holes for evacuation of right subdural hematoma. I extensively gone over the risks and benefits of the operation perioperative course and expectations of outcome and alternatives surgery and the patient and family understand and  agree to proceed forward.  Kimia Finan P 05/22/2011, 5:39 PM

## 2011-05-23 ENCOUNTER — Encounter (HOSPITAL_COMMUNITY): Payer: Self-pay | Admitting: Neurosurgery

## 2011-05-23 ENCOUNTER — Encounter (HOSPITAL_COMMUNITY)
Admission: EM | Disposition: A | Discharge status: Home / Self Care | Payer: Self-pay | Source: Home / Self Care | Attending: Neurosurgery

## 2011-05-23 ENCOUNTER — Encounter (HOSPITAL_COMMUNITY): Payer: Self-pay | Admitting: Certified Registered Nurse Anesthetist

## 2011-05-23 ENCOUNTER — Inpatient Hospital Stay (HOSPITAL_COMMUNITY): Payer: Medicare Other

## 2011-05-23 ENCOUNTER — Inpatient Hospital Stay (HOSPITAL_COMMUNITY): Payer: Medicare Other | Admitting: Certified Registered Nurse Anesthetist

## 2011-05-23 HISTORY — PX: BURR HOLE: SHX908

## 2011-05-23 LAB — GLUCOSE, CAPILLARY
Glucose-Capillary: 89 mg/dL (ref 70–99)
Glucose-Capillary: 132 mg/dL — ABNORMAL HIGH (ref 70–99)
Glucose-Capillary: 146 mg/dL — ABNORMAL HIGH (ref 70–99)

## 2011-05-23 SURGERY — CREATION, CRANIAL BURR HOLE
Anesthesia: General | Site: Head | Laterality: Left | Wound class: Clean

## 2011-05-23 MED ORDER — SODIUM CHLORIDE 0.9 % IV SOLN
INTRAVENOUS | Status: AC
  Filled 2011-05-23: qty 500

## 2011-05-23 MED ORDER — BACITRACIN 50000 UNITS IM SOLR
INTRAMUSCULAR | Status: AC
  Filled 2011-05-23: qty 1

## 2011-05-23 MED ORDER — SODIUM CHLORIDE 0.9 % IV SOLN
INTRAVENOUS | Status: DC
  Administered 2011-05-23 – 2011-05-25 (×4): via INTRAVENOUS

## 2011-05-23 MED ORDER — THROMBIN 20000 UNITS EX KIT
PACK | CUTANEOUS | Status: DC | PRN
  Administered 2011-05-23: 19:00:00 via TOPICAL

## 2011-05-23 MED ORDER — LIDOCAINE HCL (CARDIAC) 20 MG/ML IV SOLN
INTRAVENOUS | Status: DC | PRN
  Administered 2011-05-23: 100 mg via INTRAVENOUS

## 2011-05-23 MED ORDER — HYDROMORPHONE HCL PF 1 MG/ML IJ SOLN
INTRAMUSCULAR | Status: AC
  Filled 2011-05-23: qty 1

## 2011-05-23 MED ORDER — LIDOCAINE-EPINEPHRINE 1 %-1:100000 IJ SOLN
INTRAMUSCULAR | Status: DC | PRN
  Administered 2011-05-23: 2 mL

## 2011-05-23 MED ORDER — SODIUM CHLORIDE 0.9 % IV SOLN: INTRAVENOUS | Status: DC

## 2011-05-23 MED ORDER — SODIUM CHLORIDE 0.9 % IR SOLN
Status: DC | PRN
  Administered 2011-05-23: 19:00:00

## 2011-05-23 MED ORDER — NEOSTIGMINE METHYLSULFATE 1 MG/ML IJ SOLN
INTRAMUSCULAR | Status: DC | PRN
  Administered 2011-05-23: 3 mg via INTRAVENOUS

## 2011-05-23 MED ORDER — NAPROXEN 500 MG PO TABS
500.0000 mg | ORAL_TABLET | Freq: Two times a day (BID) | ORAL | Status: DC
  Filled 2011-05-23 (×4): qty 1

## 2011-05-23 MED ORDER — HYDROMORPHONE HCL PF 1 MG/ML IJ SOLN: 0.2500 mg | INTRAMUSCULAR | Status: DC | PRN

## 2011-05-23 MED ORDER — SODIUM CHLORIDE 0.9 % IV SOLN
INTRAVENOUS | Status: DC | PRN
  Administered 2011-05-23: 18:00:00 via INTRAVENOUS

## 2011-05-23 MED ORDER — PHENYLEPHRINE HCL 10 MG/ML IJ SOLN
INTRAMUSCULAR | Status: DC | PRN
  Administered 2011-05-23 (×2): 80 ug via INTRAVENOUS

## 2011-05-23 MED ORDER — MIDAZOLAM HCL 5 MG/5ML IJ SOLN
INTRAMUSCULAR | Status: DC | PRN
  Administered 2011-05-23: 2 mg via INTRAVENOUS

## 2011-05-23 MED ORDER — MORPHINE SULFATE 4 MG/ML IJ SOLN: 0.0500 mg/kg | INTRAMUSCULAR | Status: DC | PRN

## 2011-05-23 MED ORDER — GLYCOPYRROLATE 0.2 MG/ML IJ SOLN
INTRAMUSCULAR | Status: DC | PRN
  Administered 2011-05-23: .4 mg via INTRAVENOUS
  Administered 2011-05-23: 0.2 mg via INTRAVENOUS

## 2011-05-23 MED ORDER — ONDANSETRON HCL 4 MG/2ML IJ SOLN
INTRAMUSCULAR | Status: DC | PRN
  Administered 2011-05-23: 4 mg via INTRAVENOUS

## 2011-05-23 MED ORDER — PROPOFOL 10 MG/ML IV EMUL
INTRAVENOUS | Status: DC | PRN
  Administered 2011-05-23: 150 mg via INTRAVENOUS

## 2011-05-23 MED ORDER — DEXTROSE 50 % IV SOLN
INTRAVENOUS | Status: AC
  Administered 2011-05-23: 50 mL
  Filled 2011-05-23: qty 50

## 2011-05-23 MED ORDER — SUFENTANIL CITRATE 50 MCG/ML IV SOLN
INTRAVENOUS | Status: DC | PRN
  Administered 2011-05-23: 15 ug via INTRAVENOUS

## 2011-05-23 MED ORDER — ROCURONIUM BROMIDE 100 MG/10ML IV SOLN
INTRAVENOUS | Status: DC | PRN
  Administered 2011-05-23: 40 mg via INTRAVENOUS

## 2011-05-23 SURGICAL SUPPLY — 79 items
ADH SKN CLS APL DERMABOND .7 (GAUZE/BANDAGES/DRESSINGS)
BAG DECANTER FOR FLEXI CONT (MISCELLANEOUS) ×2 IMPLANT
BANDAGE GAUZE 4  KLING STR (GAUZE/BANDAGES/DRESSINGS) IMPLANT
BLADE SURG 11 STRL SS (BLADE) ×1 IMPLANT
BLADE SURG ROTATE 9660 (MISCELLANEOUS) ×1 IMPLANT
BNDG COHESIVE 4X5 TAN NS LF (GAUZE/BANDAGES/DRESSINGS) IMPLANT
BRUSH SCRUB EZ PLAIN DRY (MISCELLANEOUS) ×2 IMPLANT
BUR ACORN 9.0 PRECISION (BURR) ×2 IMPLANT
BUR ADDG 1.1 (BURR) IMPLANT
CANISTER SUCTION 2500CC (MISCELLANEOUS) ×3 IMPLANT
CATH ROBINSON RED A/P 14FR (CATHETERS) ×1 IMPLANT
CLIP TI MEDIUM 6 (CLIP) IMPLANT
CLOTH BEACON ORANGE TIMEOUT ST (SAFETY) ×2 IMPLANT
CONT SPEC 4OZ CLIKSEAL STRL BL (MISCELLANEOUS) ×2 IMPLANT
CORDS BIPOLAR (ELECTRODE) ×2 IMPLANT
DECANTER SPIKE VIAL GLASS SM (MISCELLANEOUS) ×2 IMPLANT
DERMABOND ADVANCED (GAUZE/BANDAGES/DRESSINGS)
DERMABOND ADVANCED .7 DNX12 (GAUZE/BANDAGES/DRESSINGS) ×1 IMPLANT
DRAIN JACKSON PRATT 1/4 1325 (MISCELLANEOUS) ×1 IMPLANT
DRAPE NEUROLOGICAL W/INCISE (DRAPES) IMPLANT
DRAPE SURG 17X23 STRL (DRAPES) IMPLANT
DRAPE WARM FLUID 44X44 (DRAPE) ×2 IMPLANT
DRSG OPSITE 4X5.5 SM (GAUZE/BANDAGES/DRESSINGS) ×3 IMPLANT
ELECT CAUTERY BLADE 6.4 (BLADE) ×2 IMPLANT
ELECT REM PT RETURN 9FT ADLT (ELECTROSURGICAL) ×2
ELECTRODE REM PT RTRN 9FT ADLT (ELECTROSURGICAL) ×1 IMPLANT
EVACUATOR SILICONE 100CC (DRAIN) ×1 IMPLANT
GAUZE SPONGE 4X4 16PLY XRAY LF (GAUZE/BANDAGES/DRESSINGS) IMPLANT
GLOVE BIO SURGEON STRL SZ 6.5 (GLOVE) ×1 IMPLANT
GLOVE BIO SURGEON STRL SZ7 (GLOVE) ×1 IMPLANT
GLOVE BIO SURGEON STRL SZ7.5 (GLOVE) IMPLANT
GLOVE BIO SURGEON STRL SZ8 (GLOVE) ×2 IMPLANT
GLOVE BIO SURGEON STRL SZ8.5 (GLOVE) IMPLANT
GLOVE BIOGEL M 8.0 STRL (GLOVE) IMPLANT
GLOVE ECLIPSE 6.5 STRL STRAW (GLOVE) IMPLANT
GLOVE ECLIPSE 7.0 STRL STRAW (GLOVE) IMPLANT
GLOVE ECLIPSE 7.5 STRL STRAW (GLOVE) IMPLANT
GLOVE ECLIPSE 8.0 STRL XLNG CF (GLOVE) IMPLANT
GLOVE ECLIPSE 8.5 STRL (GLOVE) IMPLANT
GLOVE EXAM NITRILE LRG STRL (GLOVE) IMPLANT
GLOVE EXAM NITRILE MD LF STRL (GLOVE) IMPLANT
GLOVE EXAM NITRILE XL STR (GLOVE) IMPLANT
GLOVE EXAM NITRILE XS STR PU (GLOVE) IMPLANT
GLOVE INDICATOR 6.5 STRL GRN (GLOVE) ×1 IMPLANT
GLOVE INDICATOR 7.0 STRL GRN (GLOVE) IMPLANT
GLOVE INDICATOR 7.5 STRL GRN (GLOVE) IMPLANT
GLOVE INDICATOR 8.0 STRL GRN (GLOVE) IMPLANT
GLOVE INDICATOR 8.5 STRL (GLOVE) ×2 IMPLANT
GLOVE OPTIFIT SS 8.0 STRL (GLOVE) IMPLANT
GLOVE SURG SS PI 6.5 STRL IVOR (GLOVE) IMPLANT
GOWN BRE IMP SLV AUR LG STRL (GOWN DISPOSABLE) ×3 IMPLANT
GOWN BRE IMP SLV AUR XL STRL (GOWN DISPOSABLE) ×2 IMPLANT
GOWN STRL REIN 2XL LVL4 (GOWN DISPOSABLE) ×2 IMPLANT
HEMOSTAT SURGICEL 2X14 (HEMOSTASIS) IMPLANT
HOOK DURA (MISCELLANEOUS) ×1 IMPLANT
KIT BASIN OR (CUSTOM PROCEDURE TRAY) ×2 IMPLANT
KIT ROOM TURNOVER OR (KITS) ×2 IMPLANT
NDL HYPO 25X1 1.5 SAFETY (NEEDLE) ×1 IMPLANT
NEEDLE HYPO 25X1 1.5 SAFETY (NEEDLE) ×2 IMPLANT
NS IRRIG 1000ML POUR BTL (IV SOLUTION) ×3 IMPLANT
PACK CRANIOTOMY (CUSTOM PROCEDURE TRAY) ×2 IMPLANT
PAD ARMBOARD 7.5X6 YLW CONV (MISCELLANEOUS) ×6 IMPLANT
PATTIES SURGICAL .25X.25 (GAUZE/BANDAGES/DRESSINGS) IMPLANT
PATTIES SURGICAL .5 X.5 (GAUZE/BANDAGES/DRESSINGS) IMPLANT
PATTIES SURGICAL .5 X3 (DISPOSABLE) IMPLANT
PATTIES SURGICAL 1X1 (DISPOSABLE) IMPLANT
PIN MAYFIELD SKULL DISP (PIN) IMPLANT
SPONGE GAUZE 4X4 12PLY (GAUZE/BANDAGES/DRESSINGS) ×1 IMPLANT
SPONGE NEURO XRAY DETECT 1X3 (DISPOSABLE) IMPLANT
SPONGE SURGIFOAM ABS GEL 100 (HEMOSTASIS) ×1 IMPLANT
STAPLER VISISTAT 35W (STAPLE) ×2 IMPLANT
SUT NURALON 4 0 TR CR/8 (SUTURE) ×3 IMPLANT
SUT VIC AB 2-0 CT1 18 (SUTURE) ×3 IMPLANT
SYR 20ML ECCENTRIC (SYRINGE) ×2 IMPLANT
SYR CONTROL 10ML LL (SYRINGE) ×2 IMPLANT
TOWEL OR 17X24 6PK STRL BLUE (TOWEL DISPOSABLE) ×2 IMPLANT
TOWEL OR 17X26 10 PK STRL BLUE (TOWEL DISPOSABLE) ×2 IMPLANT
TRAY FOLEY CATH 14FRSI W/METER (CATHETERS) IMPLANT
WATER STERILE IRR 1000ML POUR (IV SOLUTION) ×2 IMPLANT

## 2011-05-23 NOTE — Evaluation (Signed)
Speech Language Pathology Evaluation Patient Details Name: James Juarez MRN: [id number] DOB: 1940-10-07 Today's Date: 05/23/2011 Time: 1050-1105 SLP Time Calculation (min): 15 min  Problem List: There is no problem list on file for this patient.  Past Medical History:  Past Medical History  Diagnosis Date  . Diabetes mellitus   . Hypertension   . Asthma   . Diverticular disease    Past Surgical History:  Past Surgical History  Procedure Date  . Hernia repair     Assessment / Plan / Recommendation Clinical Impression  Pt appears within functional limits, family in agreement. SLP provided education regarding signs of impairment with higher level cognitive tasks and seeking out therapy if needed. Pt and family verbalize understanding. No acute therapy needed at this time. If second procedure results in changes, please reorder.     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  Overall Cognitive Status: Appears within functional limits for tasks assessed Arousal/Alertness: Awake/alert Orientation Level: Oriented X4 Attention: Alternating Alternating Attention: Appears intact Memory: Appears intact Awareness: Appears intact Problem Solving: Appears intact Executive Function: Reasoning;Decision Making Reasoning: Appears intact Decision Making: Appears intact Behaviors:  (Family reports pt is a little more "hyper" than usual) Safety/Judgment: Appears intact    Comprehension  Auditory Comprehension Overall Auditory Comprehension: Appears within functional limits for tasks assessed Conversation: Complex    Expression Verbal  Expression Overall Verbal Expression: Appears within functional limits for tasks assessed Initiation: No impairment Automatic Speech: Name;Social Response Level of Generative/Spontaneous Verbalization: Conversation   Oral / Motor Oral Motor/Sensory Function Overall Oral Motor/Sensory Function: Appears within functional limits for tasks assessed Motor Speech Overall Motor Speech: Appears within functional limits for tasks assessed     Claudine Mouton 05/23/2011, 11:15 AM

## 2011-05-23 NOTE — Progress Notes (Signed)
Pt transported to PACU and RN given report. CHG, CBG done prior to transport. Pt resting comfortably. [place], [place] M

## 2011-05-23 NOTE — Progress Notes (Signed)
Subjective: Patient reports Overall is feeling well no significant headache no numbness tingling his arms or his legs  Objective: Vital signs in last 24 hours: Temp:  [97 F (36.1 C)-97.7 F (36.5 C)] 97.7 F (36.5 C) (05/14 0100) Pulse Rate:  [57-88] 62  (05/14 0700) Resp:  [14-20] 16  (05/14 0700) BP: (96-152)/(47-87) 100/49 mmHg (05/14 0700) SpO2:  [91 %-100 %] 99 % (05/14 0700) Weight:  [167 lb 5.3 oz (75.9 kg)] 167 lb 5.3 oz (75.9 kg) (05/13 1815)  Intake/Output from previous day: 05/13 0701 - 05/14 0700 In: -  Out: 1620 [Urine:1370; Drains:250] Intake/Output this shift:    Strength 5 out of 5 wound clean and dry no pronator drift  Lab Results:  Basename 05/22/11 1805 05/22/11 1416  WBC 6.3 6.8  HGB 11.0* 12.0*  HCT 33.4* 36.2*  PLT 204 197   BMET  Basename 05/22/11 1416  NA 135  K 4.4  CL 98  CO2 25  GLUCOSE 146*  BUN 24*  CREATININE 1.14  CALCIUM 9.9    Studies/Results: Ct Head Wo Contrast  05/23/2011  *RADIOLOGY REPORT*  Clinical Data: Follow-up subdural hematoma post surgical decompression.  CT HEAD WITHOUT CONTRAST  Technique:  Contiguous axial images were obtained from the base of the skull through the vertex without contrast.  Comparison: 05/22/2011  Findings: Interval placement of a right posterior parietal cranial ostomy with subdural drainage catheter in place.  Interval placement of a right anterior frontal cranial ostomy.  There is interval development of subdural gas in the right frontal region, measuring about 12 mm depth.  The right subdural hematoma has been mostly decompressed.  There is interval increase of the left subdural hematoma with developing acute material suggesting acute on chronic read bleeding.  The left subdural hematoma now measures up to about 14 mm depth, compared with 6 mm previously.  There is associated compression of the left hemispheric sulci with mild to left to right shift of midline shift of about the 5 mm.  No evidence of  parenchymal or ventricular hemorrhage.  Gray-white matter junctions are distinct.  Basal cisterns are not effaced. Vascular calcifications.  Visualized paranasal sinuses are not opacified.  IMPRESSION: Postoperative changes on the right with interval evacuation of right subdural hematoma.  Interval development of moderate right frontal subdural gas collection, likely postoperative. Interval increase in the left subdural hematoma with increased density suggesting read bleed.  Effacement of left cerebral sulci and 5 mm left to right midline shift.  Results were telephoned to the floor at the time of dictation, 0400 hours on 05/23/2011.  I spoke with Judeth Cornfield, RN.  Original Report Authenticated By: Marlon Pel, M.D.   Ct Head Wo Contrast  05/22/2011  *RADIOLOGY REPORT*  Clinical Data: Progressive headaches for 1 month.  Recent travel to [place].  On antibiotic therapy.  No history of trauma or anticoagulation.  CT HEAD WITHOUT CONTRAST  Technique:  Contiguous axial images were obtained from the base of the skull through the vertex without contrast.  Comparison: CT temporal bone 08/05/2010.  Findings: There is a nearly isodense subdural hematoma on the right layering over the right frontal, temporal and parietal lobes.  This measures up to 1.3 cm in thickness. There is a suspected smaller contralateral subdural hematoma measuring up to 6 mm in diameter. There is only 3 mm of right-to-left midline shift.  However, there is significant effacement of the subarachnoid spaces bilaterally. There is no hydrocephalus or evidence of intraparenchymal hematoma. There is evidence  of acute infarction.  The visualized paranasal sinuses are clear.  The calvarium is intact.  IMPRESSION: Suspected right greater than left bilateral subdural hematomas, nearly isodense to cerebral cortex with associated mass effect on the gyri and effacement of the sulci.  There is mild midline shift. Brain MRI may be helpful to confirm the  bilateral nature of this process.  Neurosurgical consultation recommended.  Critical Value/emergent results were called by telephone at the time of interpretation on 05/22/2011  at 1405 hours  to  [facility], who verbally acknowledged these results.  Original Report Authenticated By: Gerrianne Scale, M.D.    Assessment/Plan: Evacuation of his right subdural hematoma some expansion of his left-sided removed from 5-6 mm up to 13 a 14 mm he'll need a left-sided bur hole placement will consider doing that either later this afternoon evening versus tomorrow.  LOS: 1 day     Bettejane Leavens P 05/23/2011, 7:42 AM

## 2011-05-23 NOTE — Anesthesia Postprocedure Evaluation (Signed)
  Anesthesia Post-op Note  Patient: James Juarez  Procedure(s) Performed: Procedure(s) (LRB): BURR HOLES (Left)  Patient Location: PACU  Anesthesia Type: General  Level of Consciousness: awake  Airway and Oxygen Therapy: Patient Spontanous Breathing  Post-op Pain: mild  Post-op Assessment: Post-op Vital signs reviewed  Post-op Vital Signs: Reviewed  Complications: No apparent anesthesia complications

## 2011-05-23 NOTE — Transfer of Care (Signed)
Immediate Anesthesia Transfer of Care Note  Patient: James Juarez  Procedure(s) Performed: Procedure(s) (LRB): BURR HOLES (Left)  Patient Location: PACU  Anesthesia Type: General  Level of Consciousness: awake, alert  and oriented  Airway & Oxygen Therapy: Patient Spontanous Breathing and Patient connected to nasal cannula oxygen  Post-op Assessment: Report given to PACU RN and Post -op Vital signs reviewed and stable  Post vital signs: Reviewed and stable  Complications: No apparent anesthesia complications

## 2011-05-23 NOTE — Progress Notes (Signed)
PT Cancellation Note  Treatment cancelled today due to medical issues with patient which prohibited therapy.  Pt currently on bedrest and plans for further Burr holes this evening.  Will hold PT evaluation.  Karmel Patricelli 05/23/2011, 10:27 AM Jake Shark, PT DPT [phone]

## 2011-05-23 NOTE — Progress Notes (Signed)
UR COMPLETED  

## 2011-05-23 NOTE — Anesthesia Preprocedure Evaluation (Signed)
Anesthesia Evaluation   Patient awake    Reviewed: Allergy & Precautions, H&P , NPO status , Patient's Chart, lab work & pertinent test results  History of Anesthesia Complications Negative for: history of anesthetic complications  Airway Mallampati: II TM Distance: >3 FB Neck ROM: Full    Dental  (+) Dental Advisory Given, Upper Dentures and Lower Dentures   Pulmonary asthma ,  breath sounds clear to auscultation  Pulmonary exam normal       Cardiovascular hypertension, Rhythm:Regular Rate:Normal     Neuro/Psych    GI/Hepatic negative GI ROS, Neg liver ROS,   Endo/Other  Diabetes mellitus-, Oral Hypoglycemic AgentsHypothyroidism   Renal/GU negative Renal ROS     Musculoskeletal   Abdominal   Peds  Hematology   Anesthesia Other Findings   Reproductive/Obstetrics                           Anesthesia Physical Anesthesia Plan  ASA: III and Emergent  Anesthesia Plan: General   Post-op Pain Management:    Induction: Intravenous  Airway Management Planned: Oral ETT  Additional Equipment:   Intra-op Plan:   Post-operative Plan: Extubation in OR  Informed Consent: I have reviewed the patients History and Physical, chart, labs and discussed the procedure including the risks, benefits and alternatives for the proposed anesthesia with the patient or authorized representative who has indicated his/her understanding and acceptance.     Plan Discussed with: CRNA, Surgeon and Anesthesiologist  Anesthesia Plan Comments:         Anesthesia Quick Evaluation

## 2011-05-23 NOTE — Op Note (Signed)
Preoperative diagnosis: Left-sided subacute subdural hematoma  Postoperative diagnosis: Same  Procedure: Left-sided burr hole craniectomy for evacuation of subacute subdural hematoma  Surgeon: Jillyn Hidden Jamieon Lannen  Anesthesia: Gen.  EBL: Minimal  History of present illness: This is a 15 or gentleman presented emergent R. yesterday large right-sided subdural subacute hematoma patient November oh craniectomy for a right-sided subacute subdural hematoma and did very well however his postoperative scan in the morning of postop day 1 it showed good resolution of his right-sided subdural however expansion of a small left-sided subdural hematoma that he came in with now expanding to 5 mm up to 1.4 cm. the size and location of the expansion of his left-sided subacute subdural hematoma with midline shift I recommended the patient and family burr hole craniectomy on the left for evacuation of a left-sided subacute subdural hematoma. I would've the risks benefits of the operation as well as perioperative course and expectations of outcome alternatives of surgery and they understood degree and agree to proceed forward.  Operative procedure: Patient was brought into the or was induced under general anesthesia and positioned supine with a shoulder bump under his left shoulder his head turned the right to expose the left convexity. After adequate prepping and draping 2 incisions were drawn out one frontal and parietal and after infiltration of 10 cc lidocaine with epi 2 incisions were made 2 bur holes were drilled a high-speed drill. The dura was then coagulated and incised in a cruciate fashion first in the frontal burr hole which a lot of very old-appearing blood immediately came out under pressure. The cortex a subdural membrane was opened up and the cortex of the front left frontal lobe was immediately visualized and attention was then taken to the parietal burr hole with which a similar fashion up a dura was cruciate  ligament cruciate fashion a subdural membrane was opened up and some more fresh acute blood came out that was dark however he did come out under pressure and copious irrigation from the frontal to the post parietal burr hole allowed further irrigate irrigation out onto of some fresher blood as well as some old blood. There were drilled catheter was passed from the frontal bur hole to the parietal bur hole irrigating as I went clearing out all fresh blood that I could see which allowed expansion of the posterior cortex parietal occipital lobes up into the posterior burr hole to confirm that there was no solid clot behind tenting and pushing the brain away. Then further irrigation was carried out until nothing but clear fluid was expressed and then a J-P drain was passed over a 3 [place] along the convexity from the parietal bur hole to the frontal burr hole and then the parietal bur hole was closed the frontal burr hole was copiously irrigated and closed as well. Then the drains were then dressed and the incisions were dressed the patient recovered in stable condition. At the end of case on it counts and sponge counts were correct per the nurses.

## 2011-05-24 ENCOUNTER — Encounter (HOSPITAL_COMMUNITY): Payer: Self-pay | Admitting: Radiology

## 2011-05-24 ENCOUNTER — Inpatient Hospital Stay (HOSPITAL_COMMUNITY): Payer: Medicare Other

## 2011-05-24 LAB — GLUCOSE, CAPILLARY
Glucose-Capillary: 88 mg/dL (ref 70–99)
Glucose-Capillary: 55 mg/dL — ABNORMAL LOW (ref 70–99)
Glucose-Capillary: 103 mg/dL — ABNORMAL HIGH (ref 70–99)
Glucose-Capillary: 101 mg/dL — ABNORMAL HIGH (ref 70–99)
Glucose-Capillary: 98 mg/dL (ref 70–99)
Glucose-Capillary: 97 mg/dL (ref 70–99)
Glucose-Capillary: 80 mg/dL (ref 70–99)
Glucose-Capillary: 120 mg/dL — ABNORMAL HIGH (ref 70–99)

## 2011-05-24 MED ORDER — HYDROCODONE-ACETAMINOPHEN 5-325 MG PO TABS
1.0000 | ORAL_TABLET | ORAL | Status: DC | PRN
  Administered 2011-05-24: 1 via ORAL
  Administered 2011-05-25 – 2011-05-26 (×4): 2 via ORAL
  Filled 2011-05-24 (×2): qty 2
  Filled 2011-05-24: qty 1
  Filled 2011-05-24 (×3): qty 2

## 2011-05-24 MED ORDER — MORPHINE SULFATE 4 MG/ML IJ SOLN
INTRAMUSCULAR | Status: AC
Start: 2011-05-24 — End: 2011-05-24
  Administered 2011-05-24: 4 mg
  Filled 2011-05-24: qty 1

## 2011-05-24 MED ORDER — MORPHINE SULFATE 4 MG/ML IJ SOLN: 4.0000 mg | Freq: Once | INTRAMUSCULAR | Status: AC

## 2011-05-24 MED ORDER — PANTOPRAZOLE SODIUM 40 MG PO TBEC
40.0000 mg | DELAYED_RELEASE_TABLET | Freq: Every day | ORAL | Status: DC
  Administered 2011-05-24 – 2011-05-26 (×3): 40 mg via ORAL
  Filled 2011-05-24 (×3): qty 1

## 2011-05-24 MED ORDER — MORPHINE SULFATE 4 MG/ML IJ SOLN
INTRAMUSCULAR | Status: AC
  Administered 2011-05-24: 4 mg
  Filled 2011-05-24: qty 1

## 2011-05-24 NOTE — ED Provider Notes (Signed)
Medical screening examination/treatment/procedure(s) were performed by non-physician practitioner and as supervising physician I was immediately available for consultation/collaboration.  Reylene Stauder R. Charleigh Correnti, MD 05/24/11 1534 

## 2011-05-24 NOTE — Progress Notes (Signed)
OT /PT Cancellation Note  Treatment cancelled today due to:  Pt awaiting CT results and remains on bedrest. OT/ PT to reattempt 05/25/11 if appropriate.   Harrel Carina [place]   OTR/L Pager: [phone] Office: [phone] .

## 2011-05-24 NOTE — Progress Notes (Signed)
OT Cancellation Note  Treatment cancelled today due to:  Currently on bedrest. OT/ PT awaiting increased activity orders. OT/ PT to check back later today.    Harrel Carina [place]   OTR/L Pager: [phone] Office: [phone] .

## 2011-05-24 NOTE — Progress Notes (Signed)
Subjective: Patient reports Overall is feeling a little bit of pain around the incisions on the left side but no right-sided pain  Objective: Vital signs in last 24 hours: Temp:  [97 F (36.1 C)-99 F (37.2 C)] 99 F (37.2 C) (05/15 0345) Pulse Rate:  [59-78] 70  (05/15 0800) Resp:  [13-32] 16  (05/15 0800) BP: (95-145)/(49-89) 129/64 mmHg (05/15 0800) SpO2:  [96 %-100 %] 99 % (05/15 0800)  Intake/Output from previous day: 05/14 0701 - 05/15 0700 In: 2353.8 [P.O.:60; I.V.:2133.8; IV Piggyback:160] Out: 3015 [Urine:2750; Drains:215; Blood:50] Intake/Output this shift: Total I/O In: 75 [I.V.:75] Out: -   Strength is 5 out of 5 no pronator drift now much up and the left-sided J-P drain still some persistent output of the right side does appear to be decompressing the drain exit hole the left  Lab Results:  Basename 05/22/11 1805 05/22/11 1416  WBC 6.3 6.8  HGB 11.0* 12.0*  HCT 33.4* 36.2*  PLT 204 197   BMET  Basename 05/22/11 1416  NA 135  K 4.4  CL 98  CO2 25  GLUCOSE 146*  BUN 24*  CREATININE 1.14  CALCIUM 9.9    Studies/Results: Ct Head Wo Contrast  05/23/2011  *RADIOLOGY REPORT*  Clinical Data: Follow-up subdural hematoma post surgical decompression.  CT HEAD WITHOUT CONTRAST  Technique:  Contiguous axial images were obtained from the base of the skull through the vertex without contrast.  Comparison: 05/22/2011  Findings: Interval placement of a right posterior parietal cranial ostomy with subdural drainage catheter in place.  Interval placement of a right anterior frontal cranial ostomy.  There is interval development of subdural gas in the right frontal region, measuring about 12 mm depth.  The right subdural hematoma has been mostly decompressed.  There is interval increase of the left subdural hematoma with developing acute material suggesting acute on chronic read bleeding.  The left subdural hematoma now measures up to about 14 mm depth, compared with 6 mm  previously.  There is associated compression of the left hemispheric sulci with mild to left to right shift of midline shift of about the 5 mm.  No evidence of parenchymal or ventricular hemorrhage.  Gray-white matter junctions are distinct.  Basal cisterns are not effaced. Vascular calcifications.  Visualized paranasal sinuses are not opacified.  IMPRESSION: Postoperative changes on the right with interval evacuation of right subdural hematoma.  Interval development of moderate right frontal subdural gas collection, likely postoperative. Interval increase in the left subdural hematoma with increased density suggesting read bleed.  Effacement of left cerebral sulci and 5 mm left to right midline shift.  Results were telephoned to the floor at the time of dictation, 0400 hours on 05/23/2011.  I spoke with Judeth Cornfield, RN.  Original Report Authenticated By: Marlon Pel, M.D.   Ct Head Wo Contrast  05/22/2011  *RADIOLOGY REPORT*  Clinical Data: Progressive headaches for 1 month.  Recent travel to [place].  On antibiotic therapy.  No history of trauma or anticoagulation.  CT HEAD WITHOUT CONTRAST  Technique:  Contiguous axial images were obtained from the base of the skull through the vertex without contrast.  Comparison: CT temporal bone 08/05/2010.  Findings: There is a nearly isodense subdural hematoma on the right layering over the right frontal, temporal and parietal lobes.  This measures up to 1.3 cm in thickness. There is a suspected smaller contralateral subdural hematoma measuring up to 6 mm in diameter. There is only 3 mm of right-to-left midline shift.  However, there is significant effacement of the subarachnoid spaces bilaterally. There is no hydrocephalus or evidence of intraparenchymal hematoma. There is evidence of acute infarction.  The visualized paranasal sinuses are clear.  The calvarium is intact.  IMPRESSION: Suspected right greater than left bilateral subdural hematomas, nearly isodense  to cerebral cortex with associated mass effect on the gyri and effacement of the sulci.  There is mild midline shift. Brain MRI may be helpful to confirm the bilateral nature of this process.  Neurosurgical consultation recommended.  Critical Value/emergent results were called by telephone at the time of interpretation on 05/22/2011  at 1405 hours  to  [facility], who verbally acknowledged these results.  Original Report Authenticated By: Gerrianne Scale, M.D.    Assessment/Plan: Postop day 2 from her holes in the right posterior day 1 gross left patient seems to be recovering well,  He has a CAT scan ordered for later on this morning. Patient results his CAT scan all consider pulling his left-sided N. [place] is right-sided J-P drain..  LOS: 2 days     Hildred Pharo P 05/24/2011, 8:47 AM

## 2011-05-24 NOTE — Progress Notes (Signed)
Agree with OT/PT cancellation note.  Isabellamarie Randa, PT DPT [phone]  

## 2011-05-24 NOTE — Progress Notes (Signed)
Agree with OT/PT Cancellation note.  Fryda Molenda, PT DPT [phone]  

## 2011-05-25 ENCOUNTER — Inpatient Hospital Stay (HOSPITAL_COMMUNITY): Payer: Medicare Other

## 2011-05-25 LAB — GLUCOSE, CAPILLARY
Glucose-Capillary: 87 mg/dL (ref 70–99)
Glucose-Capillary: 101 mg/dL — ABNORMAL HIGH (ref 70–99)
Glucose-Capillary: 121 mg/dL — ABNORMAL HIGH (ref 70–99)
Glucose-Capillary: 101 mg/dL — ABNORMAL HIGH (ref 70–99)

## 2011-05-25 MED ORDER — POLYETHYLENE GLYCOL 3350 17 G PO PACK
17.0000 g | PACK | Freq: Every day | ORAL | Status: DC
  Administered 2011-05-25 – 2011-05-26 (×2): 17 g via ORAL
  Filled 2011-05-25 (×2): qty 1

## 2011-05-25 NOTE — Evaluation (Signed)
Physical Therapy Evaluation Patient Details Name: James Juarez MRN: [id number] DOB: 1940-09-14 Today's Date: 05/25/2011 Time: 4098-1191 PT Time Calculation (min): 16 min  PT Assessment / Plan / Recommendation Clinical Impression  Pt is 71 y/o male admitted for severe headache and MRI showed bilateral subdural hemorrhage and s/p burr hole evacuation bilaterally.  Pt is independent and has not further PT needs.  PT will sign off.    PT Assessment  Patent does not need any further PT services    Follow Up Recommendations  No PT follow up    Barriers to Discharge        lEquipment Recommendations  None recommended by PT    Recommendations for Other Services     Frequency      Precautions / Restrictions Precautions Precautions: None   Pertinent Vitals/Pain       Mobility  Bed Mobility Bed Mobility: Sitting - Scoot to Edge of Bed Supine to Sit: 7: Independent Sitting - Scoot to Edge of Bed: 7: Independent Transfers Transfers: Sit to Stand;Stand to Sit Sit to Stand: 7: Independent Stand to Sit: 7: Independent Ambulation/Gait Ambulation/Gait Assistance: 7: Independent Ambulation Distance (Feet): 300 Feet Assistive device: None Gait Pattern: Within Functional Limits Stairs: Yes Stairs Assistance: 6: Modified independent (Device/Increase time) Stair Management Technique: Forwards;Two rails Number of Stairs: 5  Modified Rankin (Stroke Patients Only) Pre-Morbid Rankin Score: No symptoms Modified Rankin: No symptoms    Exercises     PT Diagnosis:    PT Problem List:   PT Treatment Interventions:     PT Goals    Visit Information  Last PT Received On: 05/25/11 Assistance Needed: +1 PT/OT Co-Evaluation/Treatment: Yes    Subjective Data  Subjective: "I'm moving fine.  Not sure why we are doing this."  (Explained to pt that MD ordered PT/OT to assess overall mobility and safety for home.) Patient Stated Goal: To go home.   Prior Functioning  Home Living Lives  With: Spouse;Family Available Help at Discharge: Family Type of Home: House Home Access: Stairs to enter Secretary/administrator of Steps: 4 Entrance Stairs-Rails: Right;Left;Can reach both Home Layout: Two level;1/2 bath on main level Alternate Level Stairs-Number of Steps: 15 (with landing after 9 steps) Alternate Level Stairs-Rails: Right;Left Bathroom Shower/Tub: Health visitor: Standard Home Adaptive Equipment: None Prior Function Level of Independence: Independent Able to Take Stairs?: Yes Driving: Yes Vocation: Retired Musician: No difficulties Dominant Hand: Right    Cognition  Overall Cognitive Status: Appears within functional limits for tasks assessed/performed Arousal/Alertness: Awake/alert Orientation Level: Appears intact for tasks assessed Behavior During Session: [name] for tasks performed    Extremity/Trunk Assessment Right Upper Extremity Assessment RUE ROM/Strength/Tone: Within functional levels RUE Sensation: WFL - Light Touch;WFL - Proprioception RUE Coordination: WFL - gross/fine motor Left Upper Extremity Assessment LUE ROM/Strength/Tone: Within functional levels LUE Sensation: WFL - Light Touch;WFL - Proprioception LUE Coordination: WFL - gross/fine motor Right Lower Extremity Assessment RLE ROM/Strength/Tone: Within functional levels Left Lower Extremity Assessment LLE ROM/Strength/Tone: Within functional levels Trunk Assessment Trunk Assessment: Normal   Balance Standardized Balance Assessment Standardized Balance Assessment: Dynamic Gait Index Dynamic Gait Index Level Surface: Normal Change in Gait Speed: Normal Gait with Horizontal Head Turns: Normal Gait with Vertical Head Turns: Normal Gait and Pivot Turn: Normal Step Over Obstacle: Normal Step Around Obstacles: Normal Steps: Normal Total Score: 24   End of Session PT - End of Session Equipment Utilized During Treatment: Gait belt Activity Tolerance:  Patient tolerated treatment well Patient  left: in chair;with call bell/phone within reach Nurse Communication: Mobility status   [place] Mullett 05/25/2011, 2:34 PM Jake Shark, PT DPT [phone]

## 2011-05-25 NOTE — Progress Notes (Signed)
Subjective: Patient reports  overall is feeling fairly well this morning minimal headache denies rest no numbness in his arms or his legs  Objective: Vital signs in last 24 hours: Temp:  [97.8 F (36.6 C)-99.3 F (37.4 C)] 99 F (37.2 C) (05/16 0400) Pulse Rate:  [69-95] 72  (05/16 0700) Resp:  [13-27] 17  (05/16 0700) BP: (92-140)/(47-75) 117/55 mmHg (05/16 0600) SpO2:  [92 %-100 %] 96 % (05/16 0700)  Intake/Output from previous day: 05/15 0701 - 05/16 0700 In: 1875 [I.V.:1725; IV Piggyback:150] Out: 1735 [Urine:1695; Drains:40] Intake/Output this shift:    Is awake alert and oriented x4 strength is 5 out of 5 progressively mobilize we'll transfer the floor and continue with physical therapy  Lab Results:  Basename 05/22/11 1805 05/22/11 1416  WBC 6.3 6.8  HGB 11.0* 12.0*  HCT 33.4* 36.2*  PLT 204 197   BMET  Basename 05/22/11 1416  NA 135  K 4.4  CL 98  CO2 25  GLUCOSE 146*  BUN 24*  CREATININE 1.14  CALCIUM 9.9    Studies/Results: Ct Head Wo Contrast  05/24/2011  *RADIOLOGY REPORT*  Clinical Data: Status post evacuation of initially  right and most recently left subdural hematomas  CT HEAD WITHOUT CONTRAST  Technique:  Contiguous axial images were obtained from the base of the skull through the vertex without contrast.  Comparison: 05/22/2011, 05/23/2011  Findings: Improved appearance status post left frontal and parietal burr holes for evacuation of acute, subacute, and chronic left subdural hematoma.  Residual left frontal extra-axial collection 10 mm thick ( image 20, series 2) with fluid of low attenuation suggesting hygroma or irrigation fluid.  On the right, the subdural drain remains, with continued improvement right subdural, and minimal residual extra-axial fluid up to 3 mm thick in the right posterior temporal region (image 16, series 2).  Bilateral pneumocephalus.  Slight 3 mm left to right midline shift at the level of the septum pellucidum.  No new areas  of intracranial hemorrhage, mass lesion, acute stroke, or hydrocephalus.  IMPRESSION: Overall improved status post left frontal and parietal burr holes for evacuation of mixed attenuation left subdural hematoma; residual hypodense fluid collection in the subdural space on the left measures up to 10 mm thickness.  Continued improvement right subdural fluid collection with indwelling Jackson-Pratt drain.  Original Report Authenticated By: Elsie Stain, M.D.    Assessment/Plan: Transfer the floor continue physical therapy  LOS: 3 days     Arlayne Liggins P 05/25/2011, 7:20 AM

## 2011-05-25 NOTE — Progress Notes (Signed)
Occupational Therapy Evaluation Patient Details Name: James Juarez MRN: [id number] DOB: 07-10-1940 Today's Date: 05/25/2011 Time: 4098-1191 OT Time Calculation (min): 23 min  OT Assessment / Plan / Recommendation Clinical Impression  Pt admitted for severe headache and MRI showed bilateral subdural hemorrhage and s/p burr hole evacuation bilaterally. All education completed, and no further acute OT needs.    OT Assessment  Patient does not need any further OT services    Follow Up Recommendations  No OT follow up;Supervision - Intermittent    Barriers to Discharge      Equipment Recommendations  None recommended by OT    Recommendations for Other Services    Frequency       Precautions / Restrictions Precautions Precautions: None   Pertinent Vitals/Pain N/A    ADL  Grooming: Performed;Wash/dry hands;Independent Where Assessed - Grooming: Unsupported standing Upper Body Bathing: Independent;Simulated Where Assessed - Upper Body Bathing: Unsupported sitting Lower Body Bathing: Simulated;Independent Where Assessed - Lower Body Bathing: Unsupported sit to stand Upper Body Dressing: Performed;Independent Where Assessed - Upper Body Dressing: Unsupported sit to stand Lower Body Dressing: Performed;Independent Where Assessed - Lower Body Dressing: Unsupported sit to stand Toilet Transfer: Simulated;Independent Toilet Transfer Method: Other (comment) (ambulating) Toilet Transfer Equipment: Other (comment) (recliner) Tub/Shower Transfer: Simulated;Independent Tub/Shower Transfer Method: Science writer: Other (comment) (simulated step into walk in shower) Transfers/Ambulation Related to ADLs: Pt ambulated throughout room and unit independently ADL Comments: Pt is at baseline level.    OT Diagnosis:    OT Problem List:   OT Treatment Interventions:     OT Goals    Visit Information  Last OT Received On: 05/25/11 Assistance Needed: +1      Subjective Data      Prior Functioning  Home Living Lives With: Spouse;Family Available Help at Discharge: Family Type of Home: House Home Access: Stairs to enter Secretary/administrator of Steps: 4 Entrance Stairs-Rails: Right;Left;Can reach both Home Layout: Two level;1/2 bath on main level Alternate Level Stairs-Number of Steps: 15 (with landing after 9 steps) Alternate Level Stairs-Rails: Right;Left Bathroom Shower/Tub: Health visitor: Standard Home Adaptive Equipment: None Prior Function Level of Independence: Independent Able to Take Stairs?: Yes Driving: Yes Vocation: Retired Musician: No difficulties Dominant Hand: Right    Cognition  Overall Cognitive Status: Appears within functional limits for tasks assessed/performed Arousal/Alertness: Awake/alert Orientation Level: Appears intact for tasks assessed Behavior During Session: [facility] for tasks performed    Extremity/Trunk Assessment Right Upper Extremity Assessment RUE ROM/Strength/Tone: Within functional levels RUE Sensation: WFL - Light Touch;WFL - Proprioception RUE Coordination: WFL - gross/fine motor Left Upper Extremity Assessment LUE ROM/Strength/Tone: Within functional levels LUE Sensation: WFL - Light Touch;WFL - Proprioception LUE Coordination: WFL - gross/fine motor Right Lower Extremity Assessment RLE ROM/Strength/Tone: Within functional levels Left Lower Extremity Assessment LLE ROM/Strength/Tone: Within functional levels Trunk Assessment Trunk Assessment: Normal   Mobility Bed Mobility Bed Mobility: Sitting - Scoot to Edge of Bed Supine to Sit: 7: Independent Sitting - Scoot to [place] of Bed: 7: Independent Transfers Sit to Stand: 7: Independent Stand to Sit: 7: Independent   Exercise    Balance Standardized Balance Assessment Standardized Balance Assessment: Dynamic Gait Index Dynamic Gait Index Level Surface: Normal Change in Gait Speed: Normal Gait  with Horizontal Head Turns: Normal Gait with Vertical Head Turns: Normal Gait and Pivot Turn: Normal Step Over Obstacle: Normal Step Around Obstacles: Normal Steps: Normal Total Score: 24   End of Session OT - End of Session  Activity Tolerance: Patient tolerated treatment well Patient left: in chair;with call bell/phone within reach Nurse Communication: Mobility status  05/25/2011 Cipriano Mile OTR/L Pager [phone] Office [phone]  Cipriano Mile 05/25/2011, 2:37 PM

## 2011-05-26 LAB — GLUCOSE, CAPILLARY
Glucose-Capillary: 93 mg/dL (ref 70–99)
Glucose-Capillary: 98 mg/dL (ref 70–99)

## 2011-05-26 MED ORDER — HYDROCODONE-ACETAMINOPHEN 5-325 MG PO TABS: 1.0000 | ORAL_TABLET | ORAL | Status: AC | PRN

## 2011-05-26 NOTE — Discharge Instructions (Signed)
No lifting no bending no twisting no driving a riding a car less discomfort for the doctor's office. Keep the incision is clean and dry dressed with Neosporin only. Call my office at area code [phone]. I would like to follow him up in approximately one week.

## 2011-05-26 NOTE — Discharge Summary (Signed)
Physician Discharge Summary  Patient ID: James Juarez MRN: [id number] DOB/AGE: 08/22/1940 71 y.o.  Admit date: 05/22/2011 Discharge date: 05/26/2011  Admission Diagnoses: Subacute subdural hematomas bilaterally  Discharge Diagnoses: Same Active Problems:  * No active hospital problems. *    Discharged Condition: good  Hospital Course:  Patient was admitted to the emergency room with worsening headaches without difficulty. Workup revealed a large right-sided subacute subdural and a small left-sided subacute subdural. Patient was a recommended emergent evacuation of burr hole treatment of his right-sided subdural. Underwent this on hospital day 1, postoperatively on his followup CT scan showed expansion of the left chronic subdural hematoma and was recommended burr hole to on the left on hospital day 2. He is recovered very well from both these procedures progressive ambulation voiding spontaneous he tolerating regular diet and pain is well controlled on pills with significant proven of his preoperative headaches and dizziness. Followup CTs it showed a small amount of residual CSF hygroma on the left but less mass effect and progressive improvement. He still be discharged home scheduled followup approximately 1-2 weeks.  Consults: Significant Diagnostic Studies: Treatments: Right and left sided burr hole craniectomy for evacuation of subacute subdural hematomas the Discharge Exam: Blood pressure 122/63, pulse 81, temperature 97 F (36.1 C), temperature source Oral, resp. rate 17, height 5\' 10"  (1.778 m), weight 75.9 kg (167 lb 5.3 oz), SpO2 96.00%. Neurologically intact awake alert oriented strength out of 5 no pronator drift.  Disposition: Home   Medication List  As of 05/26/2011 11:28 AM   TAKE these medications         acetaminophen 325 MG tablet   Commonly known as: TYLENOL   Take 325 mg by mouth every 6 (six) hours as needed. For pain      albuterol 108 (90 BASE) MCG/ACT inhaler     Commonly known as: PROVENTIL HFA;VENTOLIN HFA   Inhale 2 puffs into the lungs every 6 (six) hours as needed. For shortness of breath      amoxicillin-clavulanate 875-125 MG per tablet   Commonly known as: AUGMENTIN   Take 1 tablet by mouth 2 (two) times daily.      aspirin EC 81 MG tablet   Take 81 mg by mouth daily.      clobetasol 0.05 % Gel   Commonly known as: TEMOVATE   Apply 1 application topically daily as needed. To affected area      fexofenadine 180 MG tablet   Commonly known as: ALLEGRA   Take 180 mg by mouth daily as needed. For allergies      glipiZIDE 5 MG 24 hr tablet   Commonly known as: GLUCOTROL XL   Take 5 mg by mouth daily.      HYDROcodone-acetaminophen 5-325 MG per tablet   Commonly known as: NORCO   Take 1-2 tablets by mouth every 4 (four) hours as needed.      levothyroxine 75 MCG tablet   Commonly known as: SYNTHROID, LEVOTHROID   Take 75 mcg by mouth daily.      losartan 50 MG tablet   Commonly known as: COZAAR   Take 50 mg by mouth daily.      mulitivitamin with minerals Tabs   Take 1 tablet by mouth daily.      naproxen sodium 220 MG tablet   Commonly known as: ANAPROX   Take 220 mg by mouth 2 (two) times daily with a meal. For pain      naproxen sodium 550  MG tablet   Commonly known as: ANAPROX   Take 550 mg by mouth 2 (two) times daily with a meal.      pioglitazone 30 MG tablet   Commonly known as: ACTOS   Take 30 mg by mouth daily.      rosuvastatin 5 MG tablet   Commonly known as: CRESTOR   Take 5 mg by mouth daily.      sitaGLIPtan-metformin 50-1000 MG per tablet   Commonly known as: JANUMET   Take 1 tablet by mouth 2 (two) times daily with a meal.           Follow-up Information    Follow up in 1 week.         Signed: Dailen Mcclish P 05/26/2011, 11:28 AM

## 2011-05-26 NOTE — Progress Notes (Signed)
Subjective: Patient reports That is feeling very well today he has virtually no headache just off and bandages eyes walking and inability well with no dizziness or unsteadiness  Objective: Vital signs in last 24 hours: Temp:  [97 F (36.1 C)-98.2 F (36.8 C)] 97 F (36.1 C) (05/17 1000) Pulse Rate:  [75-100] 81  (05/17 1000) Resp:  [16-20] 17  (05/17 1000) BP: (109-145)/(57-75) 122/63 mmHg (05/17 1000) SpO2:  [96 %-100 %] 96 % (05/17 1000)  Intake/Output from previous day: 05/16 0701 - 05/17 0700 In: 150 [I.V.:150] Out: 200 [Urine:200] Intake/Output this shift:    Awake alert oriented x4 strength is 5 out of 5 no pronator drift wounds are clean and dry the  Lab Results: No results found for this basename: WBC:2,HGB:2,HCT:2,PLT:2 in the last 72 hours BMET No results found for this basename: NA:2,K:2,CL:2,CO2:2,GLUCOSE:2,BUN:2,CREATININE:2,CALCIUM:2 in the last 72 hours  Studies/Results: Ct Head Wo Contrast  05/25/2011  *RADIOLOGY REPORT*  Clinical Data: Follow-up subdural hematoma.  CT HEAD WITHOUT CONTRAST  Technique:  Contiguous axial images were obtained from the base of the skull through the vertex without contrast.  Comparison: Multiple priors, most recent 05/24/2011  Findings: Right subdural drain has been removed.  Continued improvement on the right with only minimal extra-axial fluid in the far anterior frontal region, with slight residual pneumocephalus.  Residual left frontal extra-axial fluid, similar in thickness to yesterday's study measuring 11 mm, with more generalized pneumocephalus adjacent to the left frontal burr hole. Also slight hyperdensity adjacent to the frontal burr hole.  Slight increased attenuation compared with yesterday's examination, now approaching 20 HU.Improved left to right shift of 2 mm, as compared to 3 mm yesterday.  No acute stroke or parenchymal hemorrhage.  Unchanged sinuses and mastoids.  IMPRESSION: Right subdural drain has been removed.   Continued improvement on the right.  Stable thickness of 11 mm left frontal extra-axial fluid collection, see comments above.  Original Report Authenticated By: Elsie Stain, M.D.    Assessment/Plan: Postop day 4 from burr hole to the right posterior day 3 from her holes in the left patient doing well recovering well scans that showed interval improvement the patient still be discharged home in the care of his family was scheduled followup approximately one week.  LOS: 4 days     Chasin Findling P 05/26/2011, 11:24 AM

## 2011-06-12 ENCOUNTER — Other Ambulatory Visit: Payer: Self-pay | Admitting: Neurosurgery

## 2011-06-12 DIAGNOSIS — I6200 Nontraumatic subdural hemorrhage, unspecified: Secondary | ICD-10-CM

## 2011-06-13 DIAGNOSIS — E119 Type 2 diabetes mellitus without complications: Secondary | ICD-10-CM | POA: Diagnosis not present

## 2011-06-13 DIAGNOSIS — E7800 Pure hypercholesterolemia, unspecified: Secondary | ICD-10-CM | POA: Diagnosis not present

## 2011-06-13 DIAGNOSIS — E039 Hypothyroidism, unspecified: Secondary | ICD-10-CM | POA: Diagnosis not present

## 2011-06-13 DIAGNOSIS — D649 Anemia, unspecified: Secondary | ICD-10-CM | POA: Diagnosis not present

## 2011-06-14 ENCOUNTER — Encounter (HOSPITAL_COMMUNITY)
Admission: EM | Disposition: A | Discharge status: Home / Self Care | Payer: Self-pay | Source: Home / Self Care | Attending: Neurosurgery

## 2011-06-14 ENCOUNTER — Encounter (HOSPITAL_COMMUNITY): Payer: Self-pay | Admitting: Emergency Medicine

## 2011-06-14 ENCOUNTER — Encounter (HOSPITAL_COMMUNITY): Payer: Self-pay | Admitting: *Deleted

## 2011-06-14 ENCOUNTER — Inpatient Hospital Stay (HOSPITAL_COMMUNITY): Payer: Medicare Other | Admitting: *Deleted

## 2011-06-14 ENCOUNTER — Ambulatory Visit
Admission: RE | Admit: 2011-06-14 | Discharge: 2011-06-14 | Disposition: A | Discharge status: Home / Self Care | Payer: Medicare Other | Source: Ambulatory Visit | Attending: Neurosurgery | Admitting: Neurosurgery

## 2011-06-14 ENCOUNTER — Inpatient Hospital Stay (HOSPITAL_COMMUNITY)
Admission: EM | Admit: 2011-06-14 | Discharge: 2011-06-21 | DRG: 027 | Disposition: A | Discharge status: Home / Self Care | Payer: Medicare Other | Attending: Neurosurgery | Admitting: Neurosurgery

## 2011-06-14 ENCOUNTER — Inpatient Hospital Stay (HOSPITAL_COMMUNITY): Admission: AD | Admit: 2011-06-14 | Payer: Medicare Other | Source: Ambulatory Visit | Admitting: Neurosurgery

## 2011-06-14 DIAGNOSIS — J45909 Unspecified asthma, uncomplicated: Secondary | ICD-10-CM | POA: Diagnosis present

## 2011-06-14 DIAGNOSIS — S065X9A Traumatic subdural hemorrhage with loss of consciousness of unspecified duration, initial encounter: Secondary | ICD-10-CM

## 2011-06-14 DIAGNOSIS — I6200 Nontraumatic subdural hemorrhage, unspecified: Secondary | ICD-10-CM | POA: Diagnosis not present

## 2011-06-14 DIAGNOSIS — R4701 Aphasia: Secondary | ICD-10-CM | POA: Diagnosis not present

## 2011-06-14 DIAGNOSIS — E119 Type 2 diabetes mellitus without complications: Secondary | ICD-10-CM | POA: Diagnosis present

## 2011-06-14 DIAGNOSIS — S065XAA Traumatic subdural hemorrhage with loss of consciousness status unknown, initial encounter: Secondary | ICD-10-CM | POA: Diagnosis not present

## 2011-06-14 DIAGNOSIS — G9389 Other specified disorders of brain: Secondary | ICD-10-CM | POA: Diagnosis not present

## 2011-06-14 DIAGNOSIS — R5383 Other fatigue: Secondary | ICD-10-CM | POA: Diagnosis not present

## 2011-06-14 DIAGNOSIS — R5381 Other malaise: Secondary | ICD-10-CM | POA: Diagnosis not present

## 2011-06-14 DIAGNOSIS — R4789 Other speech disturbances: Secondary | ICD-10-CM | POA: Diagnosis not present

## 2011-06-14 DIAGNOSIS — Z9889 Other specified postprocedural states: Secondary | ICD-10-CM | POA: Diagnosis not present

## 2011-06-14 DIAGNOSIS — R51 Headache: Secondary | ICD-10-CM | POA: Diagnosis not present

## 2011-06-14 DIAGNOSIS — Z87891 Personal history of nicotine dependence: Secondary | ICD-10-CM | POA: Diagnosis not present

## 2011-06-14 DIAGNOSIS — Z79899 Other long term (current) drug therapy: Secondary | ICD-10-CM | POA: Diagnosis not present

## 2011-06-14 DIAGNOSIS — G459 Transient cerebral ischemic attack, unspecified: Secondary | ICD-10-CM | POA: Diagnosis not present

## 2011-06-14 DIAGNOSIS — I10 Essential (primary) hypertension: Secondary | ICD-10-CM | POA: Diagnosis present

## 2011-06-14 DIAGNOSIS — I619 Nontraumatic intracerebral hemorrhage, unspecified: Secondary | ICD-10-CM | POA: Diagnosis not present

## 2011-06-14 HISTORY — PX: CRANIOTOMY: SHX93

## 2011-06-14 HISTORY — DX: Traumatic subdural hemorrhage with loss of consciousness of unspecified duration, initial encounter: S06.5X9A

## 2011-06-14 LAB — CBC
HCT: 34.6 % — ABNORMAL LOW (ref 39.0–52.0)
HCT: 33.7 % — ABNORMAL LOW (ref 39.0–52.0)
Hemoglobin: 11.1 g/dL — ABNORMAL LOW (ref 13.0–17.0)
Hemoglobin: 11 g/dL — ABNORMAL LOW (ref 13.0–17.0)
MCH: 28.9 pg (ref 26.0–34.0)
MCH: 29.6 pg (ref 26.0–34.0)
MCHC: 32.1 g/dL (ref 30.0–36.0)
MCHC: 32.6 g/dL (ref 30.0–36.0)
MCV: 90.1 fL (ref 78.0–100.0)
MCV: 90.8 fL (ref 78.0–100.0)
Platelets: 295 10*3/uL (ref 150–400)
Platelets: 305 10*3/uL (ref 150–400)
RBC: 3.84 MIL/uL — ABNORMAL LOW (ref 4.22–5.81)
RBC: 3.71 MIL/uL — ABNORMAL LOW (ref 4.22–5.81)
RDW: 15.3 % (ref 11.5–15.5)
RDW: 14.9 % (ref 11.5–15.5)
WBC: 6.2 10*3/uL (ref 4.0–10.5)
WBC: 5.5 10*3/uL (ref 4.0–10.5)

## 2011-06-14 LAB — SURGICAL PCR SCREEN
MRSA, PCR: NEGATIVE
Staphylococcus aureus: NEGATIVE

## 2011-06-14 LAB — DIFFERENTIAL
Basophils Absolute: 0 10*3/uL (ref 0.0–0.1)
Basophils Relative: 0 % (ref 0–1)
Eosinophils Absolute: 0.1 10*3/uL (ref 0.0–0.7)
Eosinophils Relative: 1 % (ref 0–5)
Lymphocytes Relative: 22 % (ref 12–46)
Lymphs Abs: 1.2 10*3/uL (ref 0.7–4.0)
Monocytes Absolute: 0.4 10*3/uL (ref 0.1–1.0)
Monocytes Relative: 6 % (ref 3–12)
Neutro Abs: 3.9 10*3/uL (ref 1.7–7.7)
Neutrophils Relative %: 70 % (ref 43–77)

## 2011-06-14 LAB — COMPREHENSIVE METABOLIC PANEL
ALT: 11 U/L (ref 0–53)
AST: 18 U/L (ref 0–37)
Albumin: 3.8 g/dL (ref 3.5–5.2)
Alkaline Phosphatase: 66 U/L (ref 39–117)
BUN: 19 mg/dL (ref 6–23)
CO2: 26 mEq/L (ref 19–32)
Calcium: 9.5 mg/dL (ref 8.4–10.5)
Chloride: 101 mEq/L (ref 96–112)
Creatinine, Ser: 1.12 mg/dL (ref 0.50–1.35)
GFR calc Af Amer: 73 mL/min — ABNORMAL LOW (ref >90)
GFR calc non Af Amer: 63 mL/min — ABNORMAL LOW (ref >90)
Glucose, Bld: 94 mg/dL (ref 70–99)
Potassium: 4.2 mEq/L (ref 3.5–5.1)
Sodium: 137 mEq/L (ref 135–145)
Total Bilirubin: 0.3 mg/dL (ref 0.3–1.2)
Total Protein: 7.2 g/dL (ref 6.0–8.3)

## 2011-06-14 LAB — PROTIME-INR
INR: 0.91 (ref 0.00–1.49)
INR: 1.05 (ref 0.00–1.49)
Prothrombin Time: 12.5 seconds (ref 11.6–15.2)
Prothrombin Time: 13.9 seconds (ref 11.6–15.2)

## 2011-06-14 LAB — GLUCOSE, CAPILLARY: Glucose-Capillary: 85 mg/dL (ref 70–99)

## 2011-06-14 LAB — APTT: aPTT: 31 seconds (ref 24–37)

## 2011-06-14 SURGERY — CRANIOTOMY HEMATOMA EVACUATION SUBDURAL
Anesthesia: General | Site: Head | Wound class: Clean

## 2011-06-14 MED ORDER — ONDANSETRON HCL 4 MG/2ML IJ SOLN: 4.0000 mg | Freq: Once | INTRAMUSCULAR | Status: DC | PRN

## 2011-06-14 MED ORDER — HETASTARCH-ELECTROLYTES 6 % IV SOLN
INTRAVENOUS | Status: DC | PRN
  Administered 2011-06-14: 22:00:00 via INTRAVENOUS

## 2011-06-14 MED ORDER — PANTOPRAZOLE SODIUM 40 MG IV SOLR
40.0000 mg | Freq: Every day | INTRAVENOUS | Status: DC
  Filled 2011-06-14: qty 40

## 2011-06-14 MED ORDER — ROCURONIUM BROMIDE 100 MG/10ML IV SOLN
INTRAVENOUS | Status: DC | PRN
  Administered 2011-06-14: 40 mg via INTRAVENOUS
  Administered 2011-06-14: 10 mg via INTRAVENOUS

## 2011-06-14 MED ORDER — LACTATED RINGERS IV SOLN
INTRAVENOUS | Status: DC | PRN
  Administered 2011-06-14: 22:00:00 via INTRAVENOUS

## 2011-06-14 MED ORDER — SITAGLIPTIN PHOS-METFORMIN HCL 50-1000 MG PO TABS: 1.0000 | ORAL_TABLET | Freq: Two times a day (BID) | ORAL | Status: DC

## 2011-06-14 MED ORDER — SODIUM CHLORIDE 0.9 % IV SOLN
INTRAVENOUS | Status: AC
  Filled 2011-06-14: qty 500

## 2011-06-14 MED ORDER — LABETALOL HCL 5 MG/ML IV SOLN: 10.0000 mg | INTRAVENOUS | Status: DC | PRN

## 2011-06-14 MED ORDER — DOCUSATE SODIUM 100 MG PO CAPS
100.0000 mg | ORAL_CAPSULE | Freq: Two times a day (BID) | ORAL | Status: DC
  Administered 2011-06-15 – 2011-06-21 (×8): 100 mg via ORAL
  Filled 2011-06-14 (×13): qty 1

## 2011-06-14 MED ORDER — BACITRACIN 50000 UNITS IM SOLR
INTRAMUSCULAR | Status: AC
  Filled 2011-06-14: qty 1

## 2011-06-14 MED ORDER — EPHEDRINE SULFATE 50 MG/ML IJ SOLN
INTRAMUSCULAR | Status: DC | PRN
  Administered 2011-06-14: 5 mg via INTRAVENOUS
  Administered 2011-06-14: 10 mg via INTRAVENOUS

## 2011-06-14 MED ORDER — ATORVASTATIN CALCIUM 10 MG PO TABS
10.0000 mg | ORAL_TABLET | Freq: Every day | ORAL | Status: DC
  Administered 2011-06-15 – 2011-06-20 (×5): 10 mg via ORAL
  Filled 2011-06-14 (×7): qty 1

## 2011-06-14 MED ORDER — ALBUTEROL SULFATE HFA 108 (90 BASE) MCG/ACT IN AERS: 2.0000 | INHALATION_SPRAY | Freq: Four times a day (QID) | RESPIRATORY_TRACT | Status: DC | PRN

## 2011-06-14 MED ORDER — ADULT MULTIVITAMIN W/MINERALS CH
1.0000 | ORAL_TABLET | Freq: Every day | ORAL | Status: DC
  Administered 2011-06-16 – 2011-06-21 (×6): 1 via ORAL
  Filled 2011-06-14 (×7): qty 1

## 2011-06-14 MED ORDER — ACETAMINOPHEN 325 MG PO TABS: 650.0000 mg | ORAL_TABLET | ORAL | Status: DC | PRN

## 2011-06-14 MED ORDER — ACETAMINOPHEN 325 MG PO TABS: 325.0000 mg | ORAL_TABLET | Freq: Four times a day (QID) | ORAL | Status: DC | PRN

## 2011-06-14 MED ORDER — FENTANYL CITRATE 0.05 MG/ML IJ SOLN
INTRAMUSCULAR | Status: DC | PRN
  Administered 2011-06-14: 50 ug via INTRAVENOUS
  Administered 2011-06-14 (×3): 100 ug via INTRAVENOUS

## 2011-06-14 MED ORDER — LIDOCAINE HCL (CARDIAC) 20 MG/ML IV SOLN
INTRAVENOUS | Status: DC | PRN
  Administered 2011-06-14: 50 mg via INTRAVENOUS

## 2011-06-14 MED ORDER — ONDANSETRON HCL 4 MG/2ML IJ SOLN
4.0000 mg | INTRAMUSCULAR | Status: DC | PRN
  Administered 2011-06-15 (×2): 4 mg via INTRAVENOUS
  Filled 2011-06-14 (×2): qty 2

## 2011-06-14 MED ORDER — MIDAZOLAM HCL 5 MG/5ML IJ SOLN
INTRAMUSCULAR | Status: DC | PRN
  Administered 2011-06-14 (×2): 1 mg via INTRAVENOUS

## 2011-06-14 MED ORDER — 0.9 % SODIUM CHLORIDE (POUR BTL) OPTIME
TOPICAL | Status: DC | PRN
  Administered 2011-06-14 (×4): 1000 mL

## 2011-06-14 MED ORDER — HYDROCODONE-ACETAMINOPHEN 5-325 MG PO TABS
1.0000 | ORAL_TABLET | ORAL | Status: DC | PRN
  Administered 2011-06-16 – 2011-06-21 (×13): 1 via ORAL
  Filled 2011-06-14 (×15): qty 1

## 2011-06-14 MED ORDER — PIOGLITAZONE HCL 30 MG PO TABS
30.0000 mg | ORAL_TABLET | Freq: Every day | ORAL | Status: DC
  Administered 2011-06-16 – 2011-06-21 (×6): 30 mg via ORAL
  Filled 2011-06-14 (×8): qty 1

## 2011-06-14 MED ORDER — ONDANSETRON HCL 4 MG/2ML IJ SOLN
INTRAMUSCULAR | Status: DC | PRN
  Administered 2011-06-14: 4 mg via INTRAVENOUS

## 2011-06-14 MED ORDER — PROPOFOL 10 MG/ML IV EMUL
INTRAVENOUS | Status: DC | PRN
  Administered 2011-06-14: 100 mg via INTRAVENOUS

## 2011-06-14 MED ORDER — ONDANSETRON HCL 4 MG/2ML IJ SOLN: 4.0000 mg | Freq: Four times a day (QID) | INTRAMUSCULAR | Status: DC | PRN

## 2011-06-14 MED ORDER — PANTOPRAZOLE SODIUM 40 MG IV SOLR
40.0000 mg | Freq: Every day | INTRAVENOUS | Status: DC
  Administered 2011-06-16: 40 mg via INTRAVENOUS
  Filled 2011-06-14 (×3): qty 40

## 2011-06-14 MED ORDER — HYDROMORPHONE HCL PF 1 MG/ML IJ SOLN
INTRAMUSCULAR | Status: AC
  Filled 2011-06-14: qty 1

## 2011-06-14 MED ORDER — HYDROMORPHONE HCL PF 1 MG/ML IJ SOLN
0.5000 mg | INTRAMUSCULAR | Status: DC | PRN
  Administered 2011-06-15 – 2011-06-16 (×8): 0.5 mg via INTRAVENOUS
  Filled 2011-06-14 (×8): qty 1

## 2011-06-14 MED ORDER — PROMETHAZINE HCL 25 MG PO TABS
12.5000 mg | ORAL_TABLET | ORAL | Status: DC | PRN
  Administered 2011-06-15: 12.5 mg via ORAL
  Filled 2011-06-14: qty 1

## 2011-06-14 MED ORDER — NEOSTIGMINE METHYLSULFATE 1 MG/ML IJ SOLN
INTRAMUSCULAR | Status: DC | PRN
  Administered 2011-06-14: 5 mg via INTRAVENOUS

## 2011-06-14 MED ORDER — BACITRACIN ZINC 500 UNIT/GM EX OINT
TOPICAL_OINTMENT | CUTANEOUS | Status: DC | PRN
  Administered 2011-06-14: 1 via TOPICAL

## 2011-06-14 MED ORDER — ALBUMIN HUMAN 5 % IV SOLN
INTRAVENOUS | Status: DC | PRN
  Administered 2011-06-14: 22:00:00 via INTRAVENOUS

## 2011-06-14 MED ORDER — CEFAZOLIN SODIUM 1-5 GM-% IV SOLN
INTRAVENOUS | Status: AC
  Administered 2011-06-14: 1 g via INTRAVENOUS
  Filled 2011-06-14: qty 50

## 2011-06-14 MED ORDER — LEVOTHYROXINE SODIUM 75 MCG PO TABS
75.0000 ug | ORAL_TABLET | Freq: Every day | ORAL | Status: DC
  Administered 2011-06-16 – 2011-06-21 (×6): 75 ug via ORAL
  Filled 2011-06-14 (×8): qty 1

## 2011-06-14 MED ORDER — DEXTROSE 5 % IV SOLN
INTRAVENOUS | Status: DC | PRN
  Administered 2011-06-14: 22:00:00 via INTRAVENOUS

## 2011-06-14 MED ORDER — GLYCOPYRROLATE 0.2 MG/ML IJ SOLN
INTRAMUSCULAR | Status: DC | PRN
  Administered 2011-06-14: 0.6 mg via INTRAVENOUS

## 2011-06-14 MED ORDER — CEFAZOLIN SODIUM 1-5 GM-% IV SOLN
1.0000 g | Freq: Three times a day (TID) | INTRAVENOUS | Status: AC
  Administered 2011-06-15 – 2011-06-17 (×8): 1 g via INTRAVENOUS
  Filled 2011-06-14 (×10): qty 50

## 2011-06-14 MED ORDER — GLIPIZIDE ER 5 MG PO TB24
5.0000 mg | ORAL_TABLET | Freq: Every day | ORAL | Status: DC
  Administered 2011-06-16 – 2011-06-21 (×6): 5 mg via ORAL
  Filled 2011-06-14 (×8): qty 1

## 2011-06-14 MED ORDER — SENNOSIDES-DOCUSATE SODIUM 8.6-50 MG PO TABS
1.0000 | ORAL_TABLET | Freq: Two times a day (BID) | ORAL | Status: DC
  Administered 2011-06-15 – 2011-06-21 (×8): 1 via ORAL
  Filled 2011-06-14 (×11): qty 1

## 2011-06-14 MED ORDER — POTASSIUM CHLORIDE IN NACL 20-0.9 MEQ/L-% IV SOLN
INTRAVENOUS | Status: DC
  Administered 2011-06-15 – 2011-06-17 (×3): via INTRAVENOUS
  Filled 2011-06-14 (×7): qty 1000

## 2011-06-14 MED ORDER — ACETAMINOPHEN 650 MG RE SUPP
650.0000 mg | RECTAL | Status: DC | PRN
Start: 2011-06-14 — End: 2011-06-21

## 2011-06-14 MED ORDER — THROMBIN 20000 UNITS EX KIT
PACK | CUTANEOUS | Status: DC | PRN
  Administered 2011-06-14: 22:00:00 via TOPICAL

## 2011-06-14 MED ORDER — SODIUM CHLORIDE 0.9 % IR SOLN
Status: DC | PRN
  Administered 2011-06-14: 22:00:00

## 2011-06-14 MED ORDER — METFORMIN HCL 500 MG PO TABS
1000.0000 mg | ORAL_TABLET | Freq: Two times a day (BID) | ORAL | Status: DC
  Administered 2011-06-15 – 2011-06-21 (×11): 1000 mg via ORAL
  Filled 2011-06-14 (×15): qty 2

## 2011-06-14 MED ORDER — LIDOCAINE-EPINEPHRINE 1 %-1:100000 IJ SOLN
INTRAMUSCULAR | Status: DC | PRN
  Administered 2011-06-14: 3 mL via INTRADERMAL

## 2011-06-14 MED ORDER — HYDROMORPHONE HCL PF 1 MG/ML IJ SOLN
0.2500 mg | INTRAMUSCULAR | Status: DC | PRN
  Administered 2011-06-14 (×2): 0.5 mg via INTRAVENOUS

## 2011-06-14 MED ORDER — LINAGLIPTIN 5 MG PO TABS
5.0000 mg | ORAL_TABLET | Freq: Every day | ORAL | Status: DC
  Administered 2011-06-15 – 2011-06-21 (×6): 5 mg via ORAL
  Filled 2011-06-14 (×8): qty 1

## 2011-06-14 MED ORDER — LORATADINE 10 MG PO TABS
10.0000 mg | ORAL_TABLET | Freq: Every day | ORAL | Status: DC
  Administered 2011-06-15 – 2011-06-21 (×6): 10 mg via ORAL
  Filled 2011-06-14 (×7): qty 1

## 2011-06-14 MED ORDER — LABETALOL HCL 5 MG/ML IV SOLN
10.0000 mg | INTRAVENOUS | Status: DC | PRN
Start: 2011-06-14 — End: 2011-06-21

## 2011-06-14 MED ORDER — MICROFIBRILLAR COLL HEMOSTAT EX PADS
MEDICATED_PAD | CUTANEOUS | Status: DC | PRN
  Administered 2011-06-14: 1 via TOPICAL

## 2011-06-14 MED ORDER — LOSARTAN POTASSIUM 50 MG PO TABS
50.0000 mg | ORAL_TABLET | Freq: Every day | ORAL | Status: DC
  Administered 2011-06-16 – 2011-06-21 (×5): 50 mg via ORAL
  Filled 2011-06-14 (×7): qty 1

## 2011-06-14 MED ORDER — ONDANSETRON HCL 4 MG PO TABS: 4.0000 mg | ORAL_TABLET | ORAL | Status: DC | PRN

## 2011-06-14 SURGICAL SUPPLY — 76 items
ADH SKN CLS APL DERMABOND .7 (GAUZE/BANDAGES/DRESSINGS) ×1
BAG DECANTER FOR FLEXI CONT (MISCELLANEOUS) ×2 IMPLANT
BANDAGE GAUZE 4  KLING STR (GAUZE/BANDAGES/DRESSINGS) IMPLANT
BIT DRILL WIRE PASS 1.3MM (BIT) ×1 IMPLANT
BLADE SURG 11 STRL SS (BLADE) ×2 IMPLANT
BLADE SURG ROTATE 9660 (MISCELLANEOUS) ×1 IMPLANT
BNDG COHESIVE 4X5 TAN NS LF (GAUZE/BANDAGES/DRESSINGS) IMPLANT
BRUSH SCRUB EZ PLAIN DRY (MISCELLANEOUS) ×3 IMPLANT
BUR ACORN 9.0 PRECISION (BURR) ×2 IMPLANT
BUR ROUTER D-58 CRANI (BURR) ×1 IMPLANT
CANISTER SUCTION 2500CC (MISCELLANEOUS) ×3 IMPLANT
CLIP TI MEDIUM 6 (CLIP) IMPLANT
CLOTH BEACON ORANGE TIMEOUT ST (SAFETY) ×2 IMPLANT
CONT SPEC 4OZ CLIKSEAL STRL BL (MISCELLANEOUS) ×2 IMPLANT
CORDS BIPOLAR (ELECTRODE) ×2 IMPLANT
COVER TABLE BACK 60X90 (DRAPES) ×4 IMPLANT
DECANTER SPIKE VIAL GLASS SM (MISCELLANEOUS) ×2 IMPLANT
DERMABOND ADVANCED (GAUZE/BANDAGES/DRESSINGS) ×1
DERMABOND ADVANCED .7 DNX12 (GAUZE/BANDAGES/DRESSINGS) ×1 IMPLANT
DRAPE NEUROLOGICAL W/INCISE (DRAPES) ×2 IMPLANT
DRAPE SURG 17X23 STRL (DRAPES) IMPLANT
DRAPE WARM FLUID 44X44 (DRAPE) ×2 IMPLANT
DRILL WIRE PASS 1.3MM (BIT) ×2
DRSG OPSITE 4X5.5 SM (GAUZE/BANDAGES/DRESSINGS) ×1 IMPLANT
ELECT CAUTERY BLADE 6.4 (BLADE) ×2 IMPLANT
ELECT REM PT RETURN 9FT ADLT (ELECTROSURGICAL) ×2
ELECTRODE REM PT RTRN 9FT ADLT (ELECTROSURGICAL) ×1 IMPLANT
GAUZE SPONGE 4X4 16PLY XRAY LF (GAUZE/BANDAGES/DRESSINGS) IMPLANT
GLOVE BIO SURGEON STRL SZ 6.5 (GLOVE) ×3 IMPLANT
GLOVE BIO SURGEON STRL SZ7 (GLOVE) ×1 IMPLANT
GLOVE BIO SURGEON STRL SZ8 (GLOVE) ×2 IMPLANT
GLOVE ECLIPSE 7.5 STRL STRAW (GLOVE) ×1 IMPLANT
GLOVE EXAM NITRILE LRG STRL (GLOVE) IMPLANT
GLOVE EXAM NITRILE MD LF STRL (GLOVE) ×1 IMPLANT
GLOVE EXAM NITRILE XL STR (GLOVE) IMPLANT
GLOVE EXAM NITRILE XS STR PU (GLOVE) IMPLANT
GLOVE INDICATOR 6.5 STRL GRN (GLOVE) ×1 IMPLANT
GLOVE INDICATOR 8.5 STRL (GLOVE) ×2 IMPLANT
GOWN BRE IMP SLV AUR LG STRL (GOWN DISPOSABLE) ×2 IMPLANT
GOWN BRE IMP SLV AUR XL STRL (GOWN DISPOSABLE) ×2 IMPLANT
GOWN STRL REIN 2XL LVL4 (GOWN DISPOSABLE) ×2 IMPLANT
HEMOSTAT SURGICEL 2X14 (HEMOSTASIS) ×1 IMPLANT
HOOK DURA (MISCELLANEOUS) ×1 IMPLANT
KIT BASIN OR (CUSTOM PROCEDURE TRAY) ×2 IMPLANT
KIT ROOM TURNOVER OR (KITS) ×2 IMPLANT
NDL HYPO 25X1 1.5 SAFETY (NEEDLE) ×1 IMPLANT
NEEDLE HYPO 25X1 1.5 SAFETY (NEEDLE) ×2 IMPLANT
NS IRRIG 1000ML POUR BTL (IV SOLUTION) ×3 IMPLANT
PACK CRANIOTOMY (CUSTOM PROCEDURE TRAY) ×2 IMPLANT
PAD ARMBOARD 7.5X6 YLW CONV (MISCELLANEOUS) ×3 IMPLANT
PAD EYE OVAL STERILE LF (GAUZE/BANDAGES/DRESSINGS) IMPLANT
PATTIES SURGICAL .25X.25 (GAUZE/BANDAGES/DRESSINGS) IMPLANT
PATTIES SURGICAL .5 X.5 (GAUZE/BANDAGES/DRESSINGS) IMPLANT
PATTIES SURGICAL .5 X3 (DISPOSABLE) IMPLANT
PATTIES SURGICAL 1X1 (DISPOSABLE) IMPLANT
PIN MAYFIELD SKULL DISP (PIN) ×1 IMPLANT
PLATE 1.5  2HOLE LNG NEURO (Plate) ×1 IMPLANT
PLATE 1.5 2HOLE LNG NEURO (Plate) IMPLANT
PLATE 1.5/0.5 18.5MM BURR HOLE (Plate) ×1 IMPLANT
PLATE 1.5/0.5 22MM BURR HO (Plate) ×1 IMPLANT
SCREW SELF DRILL HT 1.5/4MM (Screw) ×9 IMPLANT
SPONGE GAUZE 4X4 12PLY (GAUZE/BANDAGES/DRESSINGS) ×1 IMPLANT
SPONGE NEURO XRAY DETECT 1X3 (DISPOSABLE) IMPLANT
SPONGE SURGIFOAM ABS GEL 100 (HEMOSTASIS) IMPLANT
STAPLER SKIN PROX WIDE 3.9 (STAPLE) IMPLANT
STAPLER VISISTAT 35W (STAPLE) ×2 IMPLANT
SUT ETHILON 3 0 FSL (SUTURE) ×1 IMPLANT
SUT NURALON 4 0 TR CR/8 (SUTURE) ×4 IMPLANT
SUT VIC AB 2-0 CT1 18 (SUTURE) ×2 IMPLANT
SYR 20ML ECCENTRIC (SYRINGE) ×2 IMPLANT
SYR CONTROL 10ML LL (SYRINGE) ×2 IMPLANT
TOWEL OR 17X24 6PK STRL BLUE (TOWEL DISPOSABLE) ×2 IMPLANT
TOWEL OR 17X26 10 PK STRL BLUE (TOWEL DISPOSABLE) ×2 IMPLANT
TRAY FOLEY CATH 14FRSI W/METER (CATHETERS) ×1 IMPLANT
UNDERPAD 30X30 INCONTINENT (UNDERPADS AND DIAPERS) IMPLANT
WATER STERILE IRR 1000ML POUR (IV SOLUTION) ×2 IMPLANT

## 2011-06-14 NOTE — ED Notes (Signed)
Pt had CT today due to headaches approx 3 weeks ago.  States Dr. Wynetta Emery told him to come to ED for bleeding on the L side of his brain.  Pt denies any symptoms.

## 2011-06-14 NOTE — Anesthesia Preprocedure Evaluation (Addendum)
Anesthesia Evaluation  Patient identified by MRN, date of birth, ID band Patient awake    Reviewed: Allergy & Precautions, H&P , NPO status , Patient's Chart, lab work & pertinent test results  Airway Mallampati: II TM Distance: >3 FB Neck ROM: Full    Dental  (+) Dental Advisory Given   Pulmonary asthma ,          Cardiovascular hypertension, Pt. on medications     Neuro/Psych  Headaches, negative psych ROS   GI/Hepatic negative GI ROS, Neg liver ROS,   Endo/Other  Diabetes mellitus-, Well Controlled, Type 2, Oral Hypoglycemic Agents  Renal/GU negative Renal ROS     Musculoskeletal negative musculoskeletal ROS (+)   Abdominal   Peds  Hematology negative hematology ROS (+)   Anesthesia Other Findings   Reproductive/Obstetrics                         Anesthesia Physical Anesthesia Plan  ASA: III and Emergent  Anesthesia Plan: General   Post-op Pain Management:    Induction: Intravenous  Airway Management Planned: Oral ETT  Additional Equipment:   Intra-op Plan:   Post-operative Plan: Extubation in OR  Informed Consent: I have reviewed the patients History and Physical, chart, labs and discussed the procedure including the risks, benefits and alternatives for the proposed anesthesia with the patient or authorized representative who has indicated his/her understanding and acceptance.   Dental advisory given  Plan Discussed with: CRNA and Surgeon  Anesthesia Plan Comments:        Anesthesia Quick Evaluation

## 2011-06-14 NOTE — ED Notes (Signed)
Patient to ED with C/O Left SDH as confirmed by CT of head this AM. Dr Wynetta Emery is to come in and see patient.

## 2011-06-14 NOTE — Preoperative (Signed)
Beta Blockers   Reason not to administer Beta Blockers:Not Applicable 

## 2011-06-14 NOTE — Anesthesia Procedure Notes (Signed)
Procedure Name: Intubation Date/Time: 06/14/2011 9:43 PM Performed by: Presten Joost S Pre-anesthesia Checklist: Patient identified, Suction available, Patient being monitored and Timeout performed Patient Re-evaluated:Patient Re-evaluated prior to inductionOxygen Delivery Method: Circle system utilized Preoxygenation: Pre-oxygenation with 100% oxygen Intubation Type: IV induction Ventilation: Mask ventilation without difficulty Laryngoscope Size: Mac and 4 Grade View: Grade I Tube type: Oral Tube size: 7.5 mm Number of attempts: 1 Airway Equipment and Method: Stylet Placement Confirmation: ETT inserted through vocal cords under direct vision,  positive ETCO2 and breath sounds checked- equal and bilateral Secured at: 22 cm Tube secured with: Tape Dental Injury: Teeth and Oropharynx as per pre-operative assessment

## 2011-06-14 NOTE — Transfer of Care (Signed)
Immediate Anesthesia Transfer of Care Note  Patient: James Juarez  Procedure(s) Performed: Procedure(s) (LRB): CRANIOTOMY HEMATOMA EVACUATION SUBDURAL (N/A)  Patient Location: NICU  Anesthesia Type: General  Level of Consciousness: oriented, sedated, patient cooperative and responds to stimulation  Airway & Oxygen Therapy: Patient Spontanous Breathing and Patient connected to nasal cannula oxygen  Post-op Assessment: Report given to NICU RN, Post -op Vital signs reviewed and stable, Patient moving all extremities and Patient moving all extremities X 4  Post vital signs: Reviewed and stable  Complications: No apparent anesthesia complications

## 2011-06-14 NOTE — ED Provider Notes (Signed)
History     CSN: [id number]  Arrival date & time 06/14/11  1507   First MD Initiated Contact with Patient 06/14/11 1533      Chief Complaint  Patient presents with  . Headache    (Consider location/radiation/quality/duration/timing/severity/associated sxs/prior treatment) HPI Comments: Patient with a recent subdural hematoma and 05/22/2011 which was evacuated by Dr. Wynetta Emery and he was going for his followup CT today which showed recurrent subdural hematoma. Patient denies any weakness, numbness, gait difficulty, vision changes, headaches. He has not taken any aspirin or other blood thinning agents. He states the only reason he is here is because of his abnormal CT otherwise he was feeling in his usual state of health  The history is provided by the patient.    Past Medical History  Diagnosis Date  . Diabetes mellitus   . Hypertension   . Asthma   . Diverticular disease     Past Surgical History  Procedure Date  . Hernia repair   . Burr hole 05/22/2011    Procedure: [name];  Surgeon: Mariam Dollar, MD;  Location: [facility];  Service: Neurosurgery;  Laterality: Right;  Right Burr Holes for evacuation of subdural hematoma  . Burr hole 05/23/2011    Procedure: [name];  Surgeon: Mariam Dollar, MD;  Location: [facility];  Service: Neurosurgery;  Laterality: Left;  Left Guss Bunde    No family history on file.  History  Substance Use Topics  . Smoking status: Former Games developer  . Smokeless tobacco: Not on file  . Alcohol Use: No      Review of Systems  Eyes: Negative for visual disturbance.  Neurological: Negative for dizziness, syncope, speech difficulty, weakness, light-headedness, numbness and headaches.  Psychiatric/Behavioral: Negative for confusion.  All other systems reviewed and are negative.    Allergies  Review of patient's allergies indicates no known allergies.  Home Medications   Current Outpatient Rx  Name Route Sig Dispense Refill  . ACETAMINOPHEN  325 MG PO TABS Oral Take 325 mg by mouth every 6 (six) hours as needed. For pain    . ALBUTEROL SULFATE HFA 108 (90 BASE) MCG/ACT IN AERS Inhalation Inhale 2 puffs into the lungs every 6 (six) hours as needed. For shortness of breath    . CLOBETASOL PROPIONATE 0.05 % EX GEL Topical Apply 1 application topically daily as needed. For face.    [name] Kitchen FEXOFENADINE HCL 180 MG PO TABS Oral Take 180 mg by mouth daily as needed. For allergies    . GLIPIZIDE ER 5 MG PO TB24 Oral Take 5 mg by mouth daily.    [name] Kitchen LEVOTHYROXINE SODIUM 75 MCG PO TABS Oral Take 75 mcg by mouth daily.    [name] Kitchen LOSARTAN POTASSIUM 50 MG PO TABS Oral Take 50 mg by mouth daily.    . ADULT MULTIVITAMIN W/MINERALS CH Oral Take 1 tablet by mouth daily.    [name] Kitchen PIOGLITAZONE HCL 30 MG PO TABS Oral Take 30 mg by mouth daily.    [name] Kitchen ROSUVASTATIN CALCIUM 5 MG PO TABS Oral Take 5 mg by mouth daily.    [name] Kitchen SITAGLIPTIN-METFORMIN HCL 50-1000 MG PO TABS Oral Take 1 tablet by mouth 2 (two) times daily with a meal.      BP 129/73  Pulse 80  Temp(Src) 98 F (36.7 C) (Oral)  Resp 15  SpO2 100%  Physical Exam  Nursing note and vitals reviewed. Constitutional: He is oriented to person, place, and time. He appears well-developed and well-nourished.  No distress.  HENT:  Head: Normocephalic and atraumatic.  Mouth/Throat: Oropharynx is clear and moist.  Eyes: Conjunctivae and EOM are normal. Pupils are equal, round, and reactive to light. Right eye exhibits no discharge. Left eye exhibits no discharge.       No photophobia  Neck: Normal range of motion. Neck supple. No spinous process tenderness present. No rigidity. No Brudzinski's sign and no Kernig's sign noted.  Cardiovascular: Normal rate, normal heart sounds and intact distal pulses.   No murmur heard. Pulmonary/Chest: Effort normal and breath sounds normal. No respiratory distress. He has no wheezes. He has no rales.  Abdominal: Soft. He exhibits no distension. There is no tenderness.  Musculoskeletal:  Normal range of motion. He exhibits no edema and no tenderness.  Lymphadenopathy:    He has no cervical adenopathy.  Neurological: He is alert and oriented to person, place, and time. He has normal strength. No cranial nerve deficit or sensory deficit. Coordination and gait normal. GCS eye subscore is 4. GCS verbal subscore is 5. GCS motor subscore is 6.  Skin: Skin is warm and dry.  Psychiatric: He has a normal mood and affect. His behavior is normal.    ED Course  Procedures (including critical care time)   Labs Reviewed  PROTIME-INR  APTT  CBC  DIFFERENTIAL  BASIC METABOLIC PANEL   Ct Head Wo Contrast  06/14/2011  *RADIOLOGY REPORT*  Clinical Data: Subdural hemorrhage with headaches and weakness.  CT HEAD WITHOUT CONTRAST  Technique:  Contiguous axial images were obtained from the base of the skull through the vertex without contrast.  Comparison: Most recent 05/25/2011  Findings: There has been reaccumulation of the previously evacuated subdural hematoma on the left, now with areas of fluid which have both increased and decreased attenuation.  The higher attenuation fluid is more peripheral suggesting re-bleed.  Maximum thickness of the left subdural is approximately 19 mm as seen on image 20. There is significant mass effect with 10 mm of left to right shift as measured at the level of the septum pellucidum.  There is no uncal herniation at this time.  There is no recurrence of the right sided extra-axial fluid collection.  IMPRESSION: Recurrent left subdural hematoma, nearly 2 cm thick, with approximately 1 cm left to right shift.  Original Report Authenticated By: Elsie Stain, M.D.     1. Subdural hematoma       MDM   Patient with a recent left subdural hematoma that was evacuated on 05/22/2011 and was getting a followup CT scan today that noted at 2 cm thick recurrent subdural hematoma with approximately 1 cm left to right shift. Patient was instructed to return to the  emergency room. On exam patient has no focal deficits and denies any headache or change in his normal.  He is well-appearing and has no complaints. He states the origin of his prior subdural hematoma is unknown as he had no trauma and the only anticoagulation he took was aspirin. He states he's no longer taking aspirin and has not changed any of her medications. Patient's exam shows no sign of focal abnormalities. Baseline labs sent and consult neurosurgery to        Gwyneth Sprout, MD 06/14/11 1622

## 2011-06-14 NOTE — Op Note (Signed)
Preoperative diagnosis: Left-sided subacute subdural hematoma  Postoperative diagnosis: Same  Procedure: Craniotomy for evacuation of left-sided subdural hematoma  Surgeon: Jillyn Hidden Rayvon Dakin  Anesthesia: Gen.  EBL: Minimal  History of present illness: Patient is a pleasant [age] gentleman who presented approximately 3 or 4 weeks ago with bilateral subdural hematomas underwent burr hole son consecutive days on each side for evacuation postoperatively patient did very well was discharged was convalescing well at home on routine followup scan showed a significant reaccumulation of subdural fluid with a large subdural and thick subdural membrane patient was readmitted at the hospital and and recommended root craniotomy for evacuation of recurrent subdural hematoma. Excess of a wrist nevus of the procedure the patient and family and they understood as well as perioperative course and expectations of outcome alternatives surgery and we proceed forward.  Operative procedure: She was brought into the or was induced under general anesthesia positioned supine with a shoulder bump under his left shoulder his head turned the right and a right-sided head position for a pterional craniotomy. After adequate prepping and draping the 2 previous burr hole sites were connected with a linear incision and the periosteal dissection carried out exposing the cranium between the 2 burr hole was drilled from probably last time. The bur holes were then cleaned out the undersurface of the dura was incised a flap was turned the dura was then opened in a cruciate fashion immediately the dark old playing blood came out under pressure the a very thick subdural membrane was immediately visualized this was removed as much as I could work underneath the craniotomy defect some additional the subdural membrane was stripped away and removed. There were some bridging veins that were bleeding and that were coagulated. The wounds and copiously  irrigated and it was irrigated and quite a bit until nothing but clear irrigant was coming out and after adequate evacuation of the clot the brain and partially reexpanded it space however still was somewhat slack. So the dura was then reapproximated with after the Nurolon Gelfoam was overlaid top of the dura some tack up sutures were put in the craniotomy flap was placed after a drain was placed subdural he in the craniotomy flap was attached to the Biomet plating system. Then the galea was closed with interrupted Vicryl's and the skin was closed running from nylon. At the end of the case all needle counts and sponge counts were correct per the nurses. Patient sent to recovery room in stable condition.

## 2011-06-14 NOTE — H&P (Signed)
James Juarez is an 71 y.o. male.   Chief Complaint: No complaints HPI: This is a 71 year old gentleman who underwent right-sided than left-sided burr hole craniotomy for evacuation of bilateral subacute subdural hematomas patient was getting routine followup CT scan this afternoon when I was called e Rennis Harding is for an expansion recurrence of his left-sided subacute subdural hematoma. I brought the patient back in the hospital for craniotomy for evacuation removal of subdural hematoma  Past Medical History  Diagnosis Date  . Diabetes mellitus   . Hypertension   . Asthma   . Diverticular disease     Past Surgical History  Procedure Date  . Hernia repair   . Burr hole 05/22/2011    Procedure: [name];  Surgeon: Mariam Dollar, MD;  Location: [facility];  Service: Neurosurgery;  Laterality: Right;  Right Burr Holes for evacuation of subdural hematoma  . Burr hole 05/23/2011    Procedure: [name];  Surgeon: Mariam Dollar, MD;  Location: [facility];  Service: Neurosurgery;  Laterality: Left;  Left Guss Bunde    No family history on file. Social History:  reports that he has quit smoking. He does not have any smokeless tobacco history on file. He reports that he does not drink alcohol. His drug history not on file.  Allergies: No Known Allergies   (Not in a hospital admission)  No results found for this or any previous visit (from the past 48 hour(s)). Ct Head Wo Contrast  06/14/2011  *RADIOLOGY REPORT*  Clinical Data: Subdural hemorrhage with headaches and weakness.  CT HEAD WITHOUT CONTRAST  Technique:  Contiguous axial images were obtained from the base of the skull through the vertex without contrast.  Comparison: Most recent 05/25/2011  Findings: There has been reaccumulation of the previously evacuated subdural hematoma on the left, now with areas of fluid which have both increased and decreased attenuation.  The higher attenuation fluid is more peripheral suggesting re-bleed.  Maximum  thickness of the left subdural is approximately 19 mm as seen on image 20. There is significant mass effect with 10 mm of left to right shift as measured at the level of the septum pellucidum.  There is no uncal herniation at this time.  There is no recurrence of the right sided extra-axial fluid collection.  IMPRESSION: Recurrent left subdural hematoma, nearly 2 cm thick, with approximately 1 cm left to right shift.  Original Report Authenticated By: Elsie Stain, M.D.    Review of Systems  Constitutional: Negative.   HENT: Negative.   Eyes: Negative.   Respiratory: Negative.   Cardiovascular: Negative.   Gastrointestinal: Negative.   Genitourinary: Negative.   Musculoskeletal: Negative.   Skin: Negative.   Neurological: Negative.   Psychiatric/Behavioral: Negative.     Blood pressure 129/73, pulse 80, temperature 98 F (36.7 C), temperature source Oral, resp. rate 15, SpO2 100.00%. Physical Exam  Constitutional: He is oriented to person, place, and time. He appears well-developed and well-nourished.  HENT:  Head: Normocephalic.  Eyes: Pupils are equal, round, and reactive to light.  Neck: Normal range of motion.  Cardiovascular: Normal rate and regular rhythm.   Respiratory: Breath sounds normal.  GI: Soft.  Neurological: He is alert and oriented to person, place, and time. He has normal strength. GCS eye subscore is 4. GCS verbal subscore is 5. GCS motor subscore is 6.  Reflex Scores:      Tricep reflexes are 1+ on the right side and 1+ on the  left side.      Bicep reflexes are 1+ on the right side and 1+ on the left side.      Brachioradialis reflexes are 1+ on the right side and 1+ on the left side.      Patellar reflexes are 1+ on the right side and 1+ on the left side.      Achilles reflexes are 1+ on the right side and 1+ on the left side.      Patient is awake alert oriented strength 5 out of 5 pupils are equal reactive to light extraocular movements are intact he has  very slight left pronator drift otherwise motion her strength is also 5 out of 5     Assessment/Plan 71 year old gentleman with large recurrent left subdural hematoma patient presents for craniotomy for evacuation of subdural hematoma I have extensively reviewed the risks and benefits of this operation with the family as well as perioperative course expectations of outcome of her surgery they understand and agree to proceed forward.  Sarena Jezek P 06/14/2011, 4:48 PM

## 2011-06-15 ENCOUNTER — Inpatient Hospital Stay (HOSPITAL_COMMUNITY): Payer: Medicare Other

## 2011-06-15 LAB — CBC
HCT: 27.1 % — ABNORMAL LOW (ref 39.0–52.0)
Hemoglobin: 8.8 g/dL — ABNORMAL LOW (ref 13.0–17.0)
MCH: 28.9 pg (ref 26.0–34.0)
MCHC: 32.5 g/dL (ref 30.0–36.0)
MCV: 88.9 fL (ref 78.0–100.0)
Platelets: 224 10*3/uL (ref 150–400)
RBC: 3.05 MIL/uL — ABNORMAL LOW (ref 4.22–5.81)
RDW: 14.9 % (ref 11.5–15.5)
WBC: 5.6 10*3/uL (ref 4.0–10.5)

## 2011-06-15 LAB — BASIC METABOLIC PANEL
BUN: 16 mg/dL (ref 6–23)
CO2: 23 mEq/L (ref 19–32)
Calcium: 8.7 mg/dL (ref 8.4–10.5)
Chloride: 105 mEq/L (ref 96–112)
Creatinine, Ser: 1.11 mg/dL (ref 0.50–1.35)
GFR calc Af Amer: 74 mL/min — ABNORMAL LOW (ref >90)
GFR calc non Af Amer: 64 mL/min — ABNORMAL LOW (ref >90)
Glucose, Bld: 88 mg/dL (ref 70–99)
Potassium: 4.1 mEq/L (ref 3.5–5.1)
Sodium: 138 mEq/L (ref 135–145)

## 2011-06-15 LAB — GLUCOSE, CAPILLARY
Glucose-Capillary: 119 mg/dL — ABNORMAL HIGH (ref 70–99)
Glucose-Capillary: 135 mg/dL — ABNORMAL HIGH (ref 70–99)
Glucose-Capillary: 120 mg/dL — ABNORMAL HIGH (ref 70–99)
Glucose-Capillary: 125 mg/dL — ABNORMAL HIGH (ref 70–99)

## 2011-06-15 MED ORDER — TETRAHYDROZOLINE HCL 0.05 % OP SOLN
1.0000 [drp] | Freq: Three times a day (TID) | OPHTHALMIC | Status: DC
  Administered 2011-06-15 – 2011-06-18 (×10): 1 [drp] via OPHTHALMIC
  Filled 2011-06-15: qty 15

## 2011-06-15 MED ORDER — TETRAHYDROZOLINE HCL 0.05 % OP SOLN
1.0000 [drp] | OPHTHALMIC | Status: DC | PRN
  Administered 2011-06-15: 1 [drp] via OPHTHALMIC
  Filled 2011-06-15: qty 15

## 2011-06-15 NOTE — Evaluation (Signed)
Physical Therapy Evaluation Patient Details Name: James Juarez MRN: [id number] DOB: 1940-07-13 Today's Date: 06/15/2011 Time: 8657-8469 PT Time Calculation (min): 23 min  PT Assessment / Plan / Recommendation Clinical Impression  Pt presents s/p craniotomy following SDH. Pt with decreased functional mobility and balance deficits, potentially caused by medication and nausea. Will continue to evaluate. Pt will benefit from skilled PT in the acute care setting in order to maximize functional mobility and safety prior to d/c    PT Assessment  Patient needs continued PT services    Follow Up Recommendations  Home health PT;Supervision for mobility/OOB [facility] pending progress)       lEquipment Recommendations  None recommended by OT;Other (comment) (TBD)       Frequency Min 4X/week    Precautions / Restrictions Precautions Precautions: None Restrictions Weight Bearing Restrictions: No         Mobility  Bed Mobility Bed Mobility: Supine to Sit;Sitting - Scoot to Edge of Bed Supine to Sit: 6: Modified independent (Device/Increase time);With rails;HOB elevated Sitting - Scoot to Edge of Bed: 4: Min assist (required verbal cues to shift weight and scoot to EOB.) Details for Bed Mobility Assistance: VC for safety and sequencing. Transfers Transfers: Sit to Stand;Stand to [employer] Transfers Sit to Stand: 4: Min guard;With upper extremity assist;From bed Stand to Sit: 4: Min guard;With upper extremity assist;To chair/3-in-1 Stand Pivot Transfers: 4: Min assist Details for Transfer Assistance: VC for hand placement and sequencing. [place] for safety as pt slightly unsteady. Min assist transfer from bed to chair for stability. Pt nauseus with movement therefore requested to not ambulate. Ambulation/Gait Ambulation/Gait Assistance: Not tested (comment)    Exercises     PT Diagnosis: Difficulty walking;Acute pain  PT Problem List: Decreased activity tolerance;Decreased  balance;Decreased mobility;Decreased knowledge of use of DME;Decreased safety awareness;Decreased knowledge of precautions;Pain PT Treatment Interventions: DME instruction;Gait training;Stair training;Functional mobility training;Therapeutic activities;Therapeutic exercise;Balance training;Patient/family education   PT Goals Acute Rehab PT Goals PT Goal Formulation: With patient Time For Goal Achievement: 06/15/11 Potential to Achieve Goals: Fair Pt will go Supine/Side to Sit: with modified independence PT Goal: Supine/Side to Sit - Progress: Goal set today Pt will go Sit to Supine/Side: with modified independence PT Goal: Sit to Supine/Side - Progress: Goal set today Pt will go Sit to Stand: with modified independence PT Goal: Sit to Stand - Progress: Goal set today Pt will go Stand to Sit: with modified independence PT Goal: Stand to Sit - Progress: Goal set today Pt will Transfer Bed to Chair/Chair to Bed: with supervision PT Transfer Goal: Bed to Chair/Chair to Bed - Progress: Goal set today Pt will Ambulate: with supervision;>150 feet;with least restrictive assistive device PT Goal: Ambulate - Progress: Goal set today Pt will Go Up / Down Stairs: 3-5 stairs;with supervision;with rail(s) PT Goal: Up/Down Stairs - Progress: Goal set today  Visit Information  Last PT Received On: 06/15/11 Assistance Needed: +1 PT/OT Co-Evaluation/Treatment: Yes    Subjective Data      Prior Functioning  Home Living Lives With: Spouse;Family Available Help at Discharge: Family Type of Home: House Home Access: Stairs to enter Secretary/administrator of Steps: 4 Entrance Stairs-Rails: Right;Left;Can reach both Home Layout: Two level;1/2 bath on main level Alternate Level Stairs-Number of Steps: 15 Alternate Level Stairs-Rails: Right;Left Bathroom Shower/Tub: Health visitor: Standard Home Adaptive Equipment: None Prior Function Level of Independence: Independent Able to  Take Stairs?: Yes Driving: Yes Vocation: Retired Musician: No difficulties Dominant Hand: Right  Cognition  Overall Cognitive Status: Appears within functional limits for tasks assessed/performed Arousal/Alertness: Awake/alert Orientation Level: Appears intact for tasks assessed Behavior During Session: [facility] for tasks performed    Extremity/Trunk Assessment Right Upper Extremity Assessment RUE ROM/Strength/Tone: WFL for tasks assessed RUE Coordination: WFL - gross/fine motor Left Upper Extremity Assessment LUE ROM/Strength/Tone: WFL for tasks assessed LUE Coordination: WFL - gross/fine motor Right Lower Extremity Assessment RLE ROM/Strength/Tone: Within functional levels RLE Sensation: WFL - Light Touch Left Lower Extremity Assessment LLE ROM/Strength/Tone: Within functional levels LLE Sensation: WFL - Light Touch   Balance    End of Session PT - End of Session Equipment Utilized During Treatment: Gait belt Activity Tolerance: Treatment limited secondary to medical complications (Comment) Patient left: in chair;with call bell/phone within reach;with family/visitor present Nurse Communication: Mobility status   Milana Kidney 06/15/2011, 10:18 AM  06/15/2011 Milana Kidney DPT PAGER: [phone] OFFICE: [phone]

## 2011-06-15 NOTE — Progress Notes (Signed)
Subjective: Patient reports Doing much better minimal headache  Objective: Vital signs in last 24 hours: Temp:  [97.4 F (36.3 C)-98.6 F (37 C)] 98.6 F (37 C) (06/06 0330) Pulse Rate:  [59-105] 68  (06/06 0730) Resp:  [12-24] 15  (06/06 0730) BP: (103-144)/(53-81) 117/65 mmHg (06/06 0730) SpO2:  [93 %-100 %] 100 % (06/06 0730) Weight:  [74.2 kg (163 lb 9.3 oz)] 74.2 kg (163 lb 9.3 oz) (06/05 1705)  Intake/Output from previous day: 06/05 0701 - 06/06 0700 In: 2272 [P.O.:120; I.V.:1350; IV Piggyback:802] Out: 1570 [Urine:1125; Drains:245; Blood:200] Intake/Output this shift:    Awake alert oriented and 5 out of 5 no pronator  Lab Results:  Basename 06/14/11 2355 06/14/11 1732  WBC 5.6 5.5  HGB 8.8* 11.0*  HCT 27.1* 33.7*  PLT 224 305   BMET  Basename 06/14/11 2355 06/14/11 1737  NA 138 137  K 4.1 4.2  CL 105 101  CO2 23 26  GLUCOSE 88 94  BUN 16 19  CREATININE 1.11 1.12  CALCIUM 8.7 9.5    Studies/Results: Ct Head Wo Contrast  06/15/2011  *RADIOLOGY REPORT*  Clinical Data: Follow-up subdural hematoma.  CT HEAD WITHOUT CONTRAST  Technique:  Contiguous axial images were obtained from the base of the skull through the vertex without contrast.  Comparison: 06/14/2011.  Findings: Subsequent surgical changes from a craniotomy and evacuation of the subdural hematoma.  A subdural drainage catheter is in place and there is minimal residual fluid, blood and air. Minimal residual mass effect on the ventricles.  4.9 mm of left to right shift.  IMPRESSION: Interval surgical evacuation of the subdural hematoma with minimal residual air, blood and fluid.  Minimal residual mass effect on the ventricles.  Original Report Authenticated By: P. Loralie Champagne, M.D.   Ct Head Wo Contrast  06/14/2011  *RADIOLOGY REPORT*  Clinical Data: Subdural hemorrhage with headaches and weakness.  CT HEAD WITHOUT CONTRAST  Technique:  Contiguous axial images were obtained from the base of the skull  through the vertex without contrast.  Comparison: Most recent 05/25/2011  Findings: There has been reaccumulation of the previously evacuated subdural hematoma on the left, now with areas of fluid which have both increased and decreased attenuation.  The higher attenuation fluid is more peripheral suggesting re-bleed.  Maximum thickness of the left subdural is approximately 19 mm as seen on image 20. There is significant mass effect with 10 mm of left to right shift as measured at the level of the septum pellucidum.  There is no uncal herniation at this time.  There is no recurrence of the right sided extra-axial fluid collection.  IMPRESSION: Recurrent left subdural hematoma, nearly 2 cm thick, with approximately 1 cm left to right shift.  Original Report Authenticated By: Elsie Stain, M.D.    Assessment/Plan: Posterior day 1 craniotomy for subdural is a CT scan which is good evacuation of subdural drain one more day keeping the ICU DC his Foley  LOS: 1 day     James Juarez P 06/15/2011, 7:37 AM

## 2011-06-15 NOTE — Progress Notes (Signed)
Occupational Therapy Evaluation Patient Details Name: James Juarez MRN: [id number] DOB: Aug 14, 1940 Today's Date: 06/15/2011 Time: 4098-1191 OT Time Calculation (min): 23 min  OT Assessment / Plan / Recommendation Clinical Impression  Patient is a pleasant 71 yo gentleman who presented approximately 3 or 4 weeks ago with bilateral subdural hematomas underwent burr hole surg on each side for evacuation. Postoperatively patient did very well was discharged home and on routine follow-up scan showed a significant reaccumulation of subdural fluid with a large subdural and thick subdural membrane. patient was readmitted at the hospital and and recommended  craniotomy for evacuation of recurrent subdural hematoma. Pt with craniotomy to Lt side on 06/14/11. Pt. will benefit from skilled OT to increase independence in ADLs.     OT Assessment  Patient needs continued OT Services    Follow Up Recommendations  Supervision/Assistance - 24 hour (x 1 week)    Barriers to Discharge      Equipment Recommendations  None recommended by OT    Recommendations for Other Services    Frequency  Min 2X/week    Precautions / Restrictions Precautions Precautions: None Restrictions Weight Bearing Restrictions: No   Pertinent Vitals/Pain Vitals Monitored. BP dropped from 137/59 to 105/58 after transferring from bed to chair. RN made aware.    ADL  Eating/Feeding: Performed;Set up Where Assessed - Eating/Feeding: Chair Grooming: Performed;Wash/dry face;Set up Where Assessed - Grooming: Supported sitting Upper Body Dressing: Performed;Set up Where Assessed - Upper Body Dressing: Unsupported sitting Toilet Transfer: Simulated;Min guard Toilet Transfer Method: Sit to stand Equipment Used: Gait belt ADL Comments: Pt. performed sit to stand transfer from bed to chair with Min Guard A. He became nauseated after sitting upright in bed. He was able to don gown with setup A. Pt. BP went from 137/59 and dropped to 105/58  after transferring to chair. RN made aware of drop.      OT Diagnosis:    OT Problem List: Decreased activity tolerance;Impaired balance (sitting and/or standing) OT Treatment Interventions:     OT Goals Acute Rehab OT Goals OT Goal Formulation: With patient Time For Goal Achievement: 06/29/11 Potential to Achieve Goals: Good ADL Goals Pt Will Perform Upper Body Bathing: with modified independence;Sitting at sink;Supported ADL Goal: Product manager - Progress: Goal set today Pt Will Perform Lower Body Bathing: with modified independence;Sit to stand from chair;Supported ADL Goal: Lower Body Bathing - Progress: Goal set today Pt Will Perform Upper Body Dressing: with modified independence;Sitting, chair;Supported ADL Goal: Upper Body Dressing - Progress: Goal set today Pt Will Perform Lower Body Dressing: with modified independence;Sit to stand from chair;Supported ADL Goal: Lower Body Dressing - Progress: Goal set today Pt Will Transfer to Toilet: with modified independence;Stand pivot transfer;Regular height toilet;Grab bars ADL Goal: Toilet Transfer - Progress: Goal set today Pt Will Perform Toileting - Clothing Manipulation: with modified independence;Standing ADL Goal: Toileting - Clothing Manipulation - Progress: Goal set today Pt Will Perform Toileting - Hygiene: with modified independence;Sit to stand from 3-in-1/toilet ADL Goal: Toileting - Hygiene - Progress: Goal set today  Visit Information  Last OT Received On: 06/15/11 Assistance Needed: +1    Subjective Data      Prior Functioning  Home Living Lives With: Spouse;Family Available Help at Discharge: Family Type of Home: House Home Access: Stairs to enter Secretary/administrator of Steps: 4 Entrance Stairs-Rails: Right;Left;Can reach both Home Layout: Two level;1/2 bath on main level Alternate Level Stairs-Number of Steps: 15 Alternate Level Stairs-Rails: Right;Left Bathroom Shower/Tub: Walk-in  shower Bathroom Toilet: Standard Home Adaptive Equipment: None Prior Function Level of Independence: Independent Able to Take Stairs?: Yes Driving: Yes Vocation: Retired Musician: No difficulties Dominant Hand: Right    Cognition  Overall Cognitive Status: Appears within functional limits for tasks assessed/performed Arousal/Alertness: Awake/alert Orientation Level: Appears intact for tasks assessed Behavior During Session: [facility] for tasks performed    Extremity/Trunk Assessment Right Upper Extremity Assessment RUE ROM/Strength/Tone: WFL for tasks assessed RUE Coordination: WFL - gross/fine motor Left Upper Extremity Assessment LUE ROM/Strength/Tone: WFL for tasks assessed LUE Coordination: WFL - gross/fine motor   Mobility Bed Mobility Bed Mobility: Supine to Sit;Sitting - Scoot to Edge of Bed Supine to Sit: 6: Modified independent (Device/Increase time);With rails;HOB elevated Sitting - Scoot to Edge of Bed: 4: Min assist (required verbal cues to shift weight and scoot to EOB.) Transfers Transfers: Sit to Stand;Stand to Sit Sit to Stand: 4: Min guard;With upper extremity assist;From bed Stand to Sit: 4: Min guard;With upper extremity assist;To chair/3-in-1   Exercise    Balance  to be further assessed  End of Session OT - End of Session Activity Tolerance: Treatment limited secondary to medication (Pt. was nauseas during treatment session today.) Patient left: in chair;with call bell/phone within reach;with family/visitor present   Jenell Milliner 06/15/2011, 9:04 AM   Lucile Shutters   OTR/L Pager: [phone] Office: [phone] .

## 2011-06-15 NOTE — Anesthesia Postprocedure Evaluation (Signed)
  Anesthesia Post-op Note  Patient: James Juarez  Procedure(s) Performed: Procedure(s) (LRB): CRANIOTOMY HEMATOMA EVACUATION SUBDURAL (N/A)  Patient Location: Nursing Unit  Anesthesia Type: General  Level of Consciousness: awake and alert   Airway and Oxygen Therapy: Patient Spontanous Breathing and Patient connected to nasal cannula oxygen  Post-op Pain: mild  Post-op Assessment: Post-op Vital signs reviewed, Patient's Cardiovascular Status Stable and Respiratory Function Stable  Post-op Vital Signs: Reviewed and stable  Complications: No apparent anesthesia complications

## 2011-06-15 NOTE — Progress Notes (Signed)
1610 pt complained of left eye irritation.  It appears slightly red.  Gave him a warm washcloth to see if it would reduce the discomfort.  He denies any vision changes.

## 2011-06-16 ENCOUNTER — Inpatient Hospital Stay (HOSPITAL_COMMUNITY): Payer: Medicare Other

## 2011-06-16 ENCOUNTER — Encounter (HOSPITAL_COMMUNITY): Payer: Self-pay | Admitting: Neurosurgery

## 2011-06-16 LAB — GLUCOSE, CAPILLARY
Glucose-Capillary: 118 mg/dL — ABNORMAL HIGH (ref 70–99)
Glucose-Capillary: 116 mg/dL — ABNORMAL HIGH (ref 70–99)
Glucose-Capillary: 76 mg/dL (ref 70–99)
Glucose-Capillary: 91 mg/dL (ref 70–99)

## 2011-06-16 MED ORDER — GADOBENATE DIMEGLUMINE 529 MG/ML IV SOLN
15.0000 mL | Freq: Once | INTRAVENOUS | Status: AC
  Administered 2011-06-16: 15 mL via INTRAVENOUS

## 2011-06-16 MED ORDER — WHITE PETROLATUM GEL
Status: AC
  Administered 2011-06-16: 18:00:00
  Filled 2011-06-16: qty 5

## 2011-06-16 NOTE — Progress Notes (Signed)
Physical Therapy Treatment Patient Details Name: James Juarez MRN: [id number] DOB: March 24, 1940 Today's Date: 06/16/2011 Time: 4098-1191 PT Time Calculation (min): 14 min  PT Assessment / Plan / Recommendation Comments on Treatment Session  Pt progressing well. Pt near baseline functional level. Stresed to pt the importance of moving slowly and safely. Will attempt stairs tomorrowl    Follow Up Recommendations  Home health PT;Supervision for mobility/OOB       Equipment Recommendations  None recommended by OT;Other (comment)       Frequency Min 4X/week   Plan Discharge plan remains appropriate;Frequency remains appropriate    Precautions / Restrictions Precautions Precautions: None       Mobility  Bed Mobility Bed Mobility: Supine to Sit;Sitting - Scoot to Edge of Bed;Sit to Supine Supine to Sit: 6: Modified independent (Device/Increase time) Sitting - Scoot to Edge of Bed: 6: Modified independent (Device/Increase time) Sit to Supine: 6: Modified independent (Device/Increase time) Transfers Transfers: Sit to Stand;Stand to [employer] Transfers Sit to Stand: 5: Supervision;With upper extremity assist;From bed Stand to Sit: 5: Supervision;With upper extremity assist;To bed Details for Transfer Assistance: VC for hand placement. Pt impulsive to stand up, although stable.  Ambulation/Gait Ambulation/Gait Assistance: 5: Supervision Ambulation Distance (Feet): 450 Feet Assistive device: None Ambulation/Gait Assistance Details: Pt ambulated throughout unit with supervision for safety. Cueing to decrease speed and ambulate safely. Gait Pattern: Within Functional Limits      PT Goals Acute Rehab PT Goals PT Goal Formulation: With patient PT Goal: Supine/Side to Sit - Progress: Met PT Goal: Sit to Supine/Side - Progress: Met PT Goal: Sit to Stand - Progress: Progressing toward goal PT Goal: Stand to Sit - Progress: Progressing toward goal PT Transfer Goal: Bed to Chair/Chair  to Bed - Progress: Progressing toward goal PT Goal: Ambulate - Progress: Met  Visit Information  Last PT Received On: 06/16/11 Assistance Needed: +1       Cognition  Overall Cognitive Status: Appears within functional limits for tasks assessed/performed Arousal/Alertness: Awake/alert Orientation Level: Appears intact for tasks assessed Behavior During Session: [facility] for tasks performed       End of Session PT - End of Session Equipment Utilized During Treatment: Gait belt Activity Tolerance: Patient tolerated treatment well Patient left: in bed;with call bell/phone within reach;with family/visitor present Nurse Communication: Mobility status    Milana Kidney 06/16/2011, 4:59 PM  06/16/2011 Milana Kidney DPT PAGER: [phone] OFFICE: [phone]

## 2011-06-16 NOTE — Progress Notes (Signed)
Subjective: Patient reports Feeling much better this morning minimal headache has some sensitivity around incision no nausea or vomiting  Objective: Vital signs in last 24 hours: Temp:  [97.6 F (36.4 C)-99.7 F (37.6 C)] 98.8 F (37.1 C) (06/07 0700) Pulse Rate:  [63-84] 71  (06/07 0700) Resp:  [10-23] 16  (06/07 0700) BP: (88-122)/(45-66) 101/52 mmHg (06/07 0700) SpO2:  [88 %-100 %] 98 % (06/07 0700)  Intake/Output from previous day: 06/06 0701 - 06/07 0700 In: 1782 [I.V.:1725; IV Piggyback:52] Out: 1695 [Urine:1585; Drains:110] Intake/Output this shift:    Strength 5 out of 5 in clean and dry drain output minimal I want him to 24 hours  Lab Results:  [name] 06/14/11 2355 06/14/11 1732  WBC 5.6 5.5  HGB 8.8* 11.0*  HCT 27.1* 33.7*  PLT 224 305   BMET  Basename 06/14/11 2355 06/14/11 1737  NA 138 137  K 4.1 4.2  CL 105 101  CO2 23 26  GLUCOSE 88 94  BUN 16 19  CREATININE 1.11 1.12  CALCIUM 8.7 9.5    Studies/Results: Ct Head Wo Contrast  06/15/2011  *RADIOLOGY REPORT*  Clinical Data: Follow-up subdural hematoma.  CT HEAD WITHOUT CONTRAST  Technique:  Contiguous axial images were obtained from the base of the skull through the vertex without contrast.  Comparison: 06/14/2011.  Findings: Subsequent surgical changes from a craniotomy and evacuation of the subdural hematoma.  A subdural drainage catheter is in place and there is minimal residual fluid, blood and air. Minimal residual mass effect on the ventricles.  4.9 mm of left to right shift.  IMPRESSION: Interval surgical evacuation of the subdural hematoma with minimal residual air, blood and fluid.  Minimal residual mass effect on the ventricles.  Original Report Authenticated By: P. Loralie Champagne, M.D.   Ct Head Wo Contrast  06/14/2011  *RADIOLOGY REPORT*  Clinical Data: Subdural hemorrhage with headaches and weakness.  CT HEAD WITHOUT CONTRAST  Technique:  Contiguous axial images were obtained from the base of  the skull through the vertex without contrast.  Comparison: Most recent 05/25/2011  Findings: There has been reaccumulation of the previously evacuated subdural hematoma on the left, now with areas of fluid which have both increased and decreased attenuation.  The higher attenuation fluid is more peripheral suggesting re-bleed.  Maximum thickness of the left subdural is approximately 19 mm as seen on image 20. There is significant mass effect with 10 mm of left to right shift as measured at the level of the septum pellucidum.  There is no uncal herniation at this time.  There is no recurrence of the right sided extra-axial fluid collection.  IMPRESSION: Recurrent left subdural hematoma, nearly 2 cm thick, with approximately 1 cm left to right shift.  Original Report Authenticated By: Elsie Stain, M.D.    Assessment/Plan: Postop day one half from evacuation craniotomy for subdural will check an MRI scan today to rule out any occult source of a spontaneous hemorrhages , subdural hematomas, like an AVM or dural AV fistula  LOS: 2 days     [name] P 06/16/2011, 8:08 AM

## 2011-06-17 LAB — GLUCOSE, CAPILLARY
Glucose-Capillary: 87 mg/dL (ref 70–99)
Glucose-Capillary: 89 mg/dL (ref 70–99)
Glucose-Capillary: 70 mg/dL (ref 70–99)
Glucose-Capillary: 82 mg/dL (ref 70–99)

## 2011-06-17 MED ORDER — ALPRAZOLAM 0.25 MG PO TABS
0.2500 mg | ORAL_TABLET | Freq: Three times a day (TID) | ORAL | Status: DC | PRN
  Administered 2011-06-17: 0.25 mg via ORAL
  Filled 2011-06-17: qty 1

## 2011-06-17 MED ORDER — PANTOPRAZOLE SODIUM 40 MG PO TBEC
40.0000 mg | DELAYED_RELEASE_TABLET | Freq: Every day | ORAL | Status: DC
  Administered 2011-06-17 – 2011-06-21 (×5): 40 mg via ORAL
  Filled 2011-06-17 (×4): qty 1

## 2011-06-17 MED ORDER — POLYETHYLENE GLYCOL 3350 17 G PO PACK
17.0000 g | PACK | Freq: Every day | ORAL | Status: DC
  Administered 2011-06-17 – 2011-06-21 (×3): 17 g via ORAL
  Filled 2011-06-17 (×5): qty 1

## 2011-06-17 NOTE — Progress Notes (Signed)
Subjective: Patient reports not able to sleep well, concerned about drain in head  Objective: Vital signs in last 24 hours: Temp:  [97.7 F (36.5 C)-98.6 F (37 C)] 98.6 F (37 C) (06/08 0400) Pulse Rate:  [65-87] 68  (06/08 0700) Resp:  [14-24] 18  (06/08 0700) BP: (99-145)/(55-69) 127/63 mmHg (06/08 0700) SpO2:  [94 %-100 %] 98 % (06/08 0700)  Intake/Output from previous day: 06/07 0701 - 06/08 0700 In: 2414 [P.O.:580; I.V.:1724; IV Piggyback:110] Out: 910 [Urine:900; Drains:10] Intake/Output this shift: Total I/O In: 66.3 [I.V.:66.3] Out: -   Physical Exam: Doing well neurologically.  Minimal drain output.  Lab Results:  Basename 06/14/11 2355 06/14/11 1732  WBC 5.6 5.5  HGB 8.8* 11.0*  HCT 27.1* 33.7*  PLT 224 305   BMET  Basename 06/14/11 2355 06/14/11 1737  NA 138 137  K 4.1 4.2  CL 105 101  CO2 23 26  GLUCOSE 88 94  BUN 16 19  CREATININE 1.11 1.12  CALCIUM 8.7 9.5    Studies/Results: Mr Laqueta Jean Wo Contrast  06/16/2011  *RADIOLOGY REPORT*  Clinical Data: Evaluate for dural AV fistula or intracranial AVM as a cause a recurrent subdural hemorrhage  MRI HEAD WITHOUT AND WITH CONTRAST  Technique:  Multiplanar, multiecho pulse sequences of the brain and surrounding structures were obtained according to standard protocol without and with intravenous contrast  Contrast: 15mL MULTIHANCE GADOBENATE DIMEGLUMINE 529 MG/ML IV SOLN  Comparison: Original CT scan 05/22/2011.  Most recent CT scan 06/15/2011  Findings: No acute stroke, acute hemorrhage, or hydrocephalus. Complex left greater than right extra-axial fluid collections, representing postoperative subdural fluid.  Gradient sequences show moderate T2 shortening in the left posterior frontal subdural space indicating recent acute hemorrhage. Prior left and right burr holes noted.  Continued mass effect left to right of 6 mm, with 9 mm mixed intensity extra-axial fluid collection on the left flattening the frontal lobe  and resulting in cingulate gyrus displacement across the midline. Insignificant residual subdural fluid on the right.  Post infusion imaging does not reveal abnormal vessels or dural AV fistulae, but there is moderate bilateral dural enhancement, thicker on the left measuring up to 4 mm, which could serve as a source of recurrent hemorrhage.  IMPRESSION: No visible pial AVM or dural AV fistula.  Significant residual left subdural mixed signal intensity hematoma and hygroma measuring up to 9 mm, with 6 mm of left to right shift and moderate 4 mm dural thickening on the left.  Insignificant residual right subdural fluid collection.  Original Report Authenticated By: Elsie Stain, M.D.    Assessment/Plan: Drain D/Ced.  Transfer to floor, one more dose Ancef.    LOS: 3 days    Dorian Heckle, MD 06/17/2011, 8:23 AM

## 2011-06-17 NOTE — Progress Notes (Signed)
Physical Therapy Treatment Patient Details Name: James Juarez MRN: [id number] DOB: June 14, 1940 Today's Date: 06/17/2011 Time: 7829-5621 PT Time Calculation (min): 8 min  PT Assessment / Plan / Recommendation Comments on Treatment Session  Pt at baseline functional level, independent with all ambulation and stairs. All educated completed with pt, as well as explination of need to take things slowly the next couple weeks and safely. No further acute PT needs    Follow Up Recommendations  No PT follow up;Supervision for mobility/OOB       Equipment Recommendations  None recommended by PT;None recommended by OT          Plan All goals met and education completed, patient dischaged from PT services    Precautions / Restrictions Precautions Precautions: None Restrictions Weight Bearing Restrictions: No       Mobility  Bed Mobility Bed Mobility: Supine to Sit;Sitting - Scoot to Edge of Bed;Sit to Supine Supine to Sit: 7: Independent Sitting - Scoot to Edge of Bed: 7: Independent Sit to Supine: 7: Independent Transfers Transfers: Sit to Stand;Stand to Sit Sit to Stand: 7: Independent Stand to Sit: 7: Independent Ambulation/Gait Ambulation/Gait Assistance: 6: Modified independent (Device/Increase time) Ambulation Distance (Feet): 200 Feet Assistive device: None Ambulation/Gait Assistance Details: No difficulties this session Gait Pattern: Within Functional Limits Gait velocity: normal gait speed Stairs: Yes Stairs Assistance: 6: Modified independent (Device/Increase time) Stair Management Technique: One rail Right;Forwards;Alternating pattern Number of Stairs: 20      PT Goals Acute Rehab PT Goals PT Goal: Supine/Side to Sit - Progress: Met PT Goal: Sit to Supine/Side - Progress: Met PT Goal: Sit to Stand - Progress: Met PT Goal: Stand to Sit - Progress: Met PT Transfer Goal: Bed to Chair/Chair to Bed - Progress: Met PT Goal: Ambulate - Progress: Met PT Goal: Up/Down  Stairs - Progress: Met  Visit Information  Last PT Received On: 06/17/11 Assistance Needed: +1    Subjective Data      Cognition  Overall Cognitive Status: Appears within functional limits for tasks assessed/performed Arousal/Alertness: Awake/alert Orientation Level: Appears intact for tasks assessed Behavior During Session: [facility] for tasks performed    Balance     End of Session PT - End of Session Equipment Utilized During Treatment: Gait belt Activity Tolerance: Patient tolerated treatment well Patient left: Other (comment) (with OT) Nurse Communication: Mobility status    Milana Kidney 06/17/2011, 10:44 AM  06/17/2011 Milana Kidney DPT PAGER: [phone] OFFICE: [phone]

## 2011-06-17 NOTE — Progress Notes (Signed)
Physical Therapy Discharge Patient Details Name: James Juarez MRN: [id number] DOB: 1940-05-02 Today's Date: 06/17/2011 Time: 7616-0737 PT Time Calculation (min): 8 min  Patient discharged from PT services secondary to goals met and no further PT needs identified.  Please see latest therapy progress note for current level of functioning and progress toward goals.    Progress and discharge plan discussed with patient and/or caregiver: Patient/Caregiver agrees with plan  Milana Kidney 06/17/2011, 10:47 AM

## 2011-06-18 LAB — GLUCOSE, CAPILLARY
Glucose-Capillary: 89 mg/dL (ref 70–99)
Glucose-Capillary: 73 mg/dL (ref 70–99)
Glucose-Capillary: 88 mg/dL (ref 70–99)
Glucose-Capillary: 97 mg/dL (ref 70–99)
Glucose-Capillary: 117 mg/dL — ABNORMAL HIGH (ref 70–99)

## 2011-06-18 NOTE — Progress Notes (Signed)
Subjective: Patient reports doing well.  Concerned about subdurals and what he can do to prevent this from happening again  Objective: Vital signs in last 24 hours: Temp:  [97 F (36.1 C)-98.8 F (37.1 C)] 98.1 F (36.7 C) (06/09 0200) Pulse Rate:  [64-82] 82  (06/09 0200) Resp:  [16-19] 16  (06/09 0200) BP: (87-132)/(32-78) 106/58 mmHg (06/09 0240) SpO2:  [95 %-100 %] 100 % (06/09 0200)  Intake/Output from previous day: 06/08 0701 - 06/09 0700 In: 506.3 [P.O.:440; I.V.:66.3] Out: -  Intake/Output this shift:    Physical Exam: Doing well.  Dressing CDI.   No drift.  Lab Results: No results found for this basename: WBC:2,HGB:2,HCT:2,PLT:2 in the last 72 hours BMET No results found for this basename: NA:2,K:2,CL:2,CO2:2,GLUCOSE:2,BUN:2,CREATININE:2,CALCIUM:2 in the last 72 hours  Studies/Results: Mr Laqueta Jean Wo Contrast  06/16/2011  *RADIOLOGY REPORT*  Clinical Data: Evaluate for dural AV fistula or intracranial AVM as a cause a recurrent subdural hemorrhage  MRI HEAD WITHOUT AND WITH CONTRAST  Technique:  Multiplanar, multiecho pulse sequences of the brain and surrounding structures were obtained according to standard protocol without and with intravenous contrast  Contrast: 15mL MULTIHANCE GADOBENATE DIMEGLUMINE 529 MG/ML IV SOLN  Comparison: Original CT scan 05/22/2011.  Most recent CT scan 06/15/2011  Findings: No acute stroke, acute hemorrhage, or hydrocephalus. Complex left greater than right extra-axial fluid collections, representing postoperative subdural fluid.  Gradient sequences show moderate T2 shortening in the left posterior frontal subdural space indicating recent acute hemorrhage. Prior left and right burr holes noted.  Continued mass effect left to right of 6 mm, with 9 mm mixed intensity extra-axial fluid collection on the left flattening the frontal lobe and resulting in cingulate gyrus displacement across the midline. Insignificant residual subdural fluid on the right.   Post infusion imaging does not reveal abnormal vessels or dural AV fistulae, but there is moderate bilateral dural enhancement, thicker on the left measuring up to 4 mm, which could serve as a source of recurrent hemorrhage.  IMPRESSION: No visible pial AVM or dural AV fistula.  Significant residual left subdural mixed signal intensity hematoma and hygroma measuring up to 9 mm, with 6 mm of left to right shift and moderate 4 mm dural thickening on the left.  Insignificant residual right subdural fluid collection.  Original Report Authenticated By: Elsie Stain, M.D.    Assessment/Plan: Mobilize today.  Anxiety better with xanax.  If doing well, may D/C home in am.    LOS: 4 days    Dorian Heckle, MD 06/18/2011, 4:51 AM

## 2011-06-19 ENCOUNTER — Inpatient Hospital Stay (HOSPITAL_COMMUNITY): Payer: Medicare Other

## 2011-06-19 LAB — GLUCOSE, CAPILLARY
Glucose-Capillary: 99 mg/dL (ref 70–99)
Glucose-Capillary: 87 mg/dL (ref 70–99)

## 2011-06-19 MED ORDER — ALPRAZOLAM 0.25 MG PO TABS: 0.2500 mg | ORAL_TABLET | Freq: Three times a day (TID) | ORAL | Status: AC | PRN

## 2011-06-19 MED ORDER — HYDROCODONE-ACETAMINOPHEN 5-325 MG PO TABS: 1.0000 | ORAL_TABLET | ORAL | Status: AC | PRN

## 2011-06-19 NOTE — Plan of Care (Signed)
Problem: Consults Goal: Diagnosis - Craniotomy Outcome: Completed/Met Date Met:  06/19/11 Subdural hematoma

## 2011-06-19 NOTE — Progress Notes (Signed)
Subjective: Patient reports Is feeling better no headaches no nausea no vomiting no dizziness inability voiding spontaneously  Objective: Vital signs in last 24 hours: Temp:  [97.9 F (36.6 C)-98.5 F (36.9 C)] 98.1 F (36.7 C) (06/10 0617) Pulse Rate:  [67-86] 78  (06/10 0617) Resp:  [18] 18  (06/10 0617) BP: (114-125)/(53-74) 120/71 mmHg (06/10 0617) SpO2:  [97 %-100 %] 97 % (06/10 0617)  Intake/Output from previous day: 06/09 0701 - 06/10 0700 In: 240 [P.O.:240] Out: -  Intake/Output this shift:    awake alert oriented strength out of 5 no pronator drift wound clean and dry  Lab Results: No results found for this basename: WBC:2,HGB:2,HCT:2,PLT:2 in the last 72 hours BMET No results found for this basename: NA:2,K:2,CL:2,CO2:2,GLUCOSE:2,BUN:2,CREATININE:2,CALCIUM:2 in the last 72 hours  Studies/Results: No results found.  Assessment/Plan: Posterior day 5 from craniotomy for subdural will plan on discharge later today if his CT is stable.  LOS: 5 days     Sailor Haughn P 06/19/2011, 8:39 AM

## 2011-06-19 NOTE — Progress Notes (Deleted)
OT Treatment for 06/17/11 (note did not populate)

## 2011-06-19 NOTE — Progress Notes (Signed)
06/17/11 1100  OT Visit Information  Last OT Received On 06/17/11  Assistance Needed +1  Precautions  Precautions None  Restrictions  Weight Bearing Restrictions No  ADL  Transfers/Ambulation Related to ADLs Pt. ambulating independently.  ambulated from 3100 to 3000 with RN  ADL Comments Pt. reports performing BADLs without difficulty.  Spoke with pt's brother who reports that he has not noted any cognitive or personality changes.  Had pt. perform path finding task using maps and signs in hospital to locate gift shop, then to find his way back to room.  Pt. required mod verbal cues to successfully read hospital map, despite instruction.  Pt. then required min cues to scan signs correctly for information when attempting to locate gift shop.  Pt. required mod verbal cues to find his way back to his room.   Was unable to visually recall information he had previously looked at, nor areas he had previously been.  Pt. quickly states that he is not good with maps.  However, am concerned with difficulty demonstrated with negotiating unfamiliar environment.  Pt appears to have deficits with executive functions, and question if he is having difficutly with visual scanning in busy, unstructured environments.  Cognition  Overall Cognitive Status Impaired  Arousal/Alertness Awake/alert  Orientation Level Appears intact for tasks assessed  Behavior During Session [facility] for tasks performed  Cognition - Other Comments Pt with deficits with executive functions  Transfers  Transfers Sit to Stand;Stand to Sit  Sit to Stand 7: Independent  Stand to Sit 7: Independent  OT - End of Session  Activity Tolerance Patient tolerated treatment well  Patient left in bed;with call bell/phone within reach;with family/visitor present  OT Plan  OT Frequency Min 2X/week  OT Recommendation  Follow Up Recommendations Supervision/Assistance - 24 hour;Outpatient OT  Equipment Recommended None recommended by OT  ADL Goals    Additional ADL Goal #1 Pt. will complete complex, unfamiliar task with min cues.  ADL Goal: Additional Goal #1 - Progress Goal set today    Jeani Hawking, OTR/L [phone]

## 2011-06-19 NOTE — Progress Notes (Signed)
Patient was d/c this am by MD, with the instruction to go home after CT scan, therefore, after results came back, MD called me that patient will not be able to go home and he will come to talk to patient and him know why.Patient is very anxious at this time. Refused antianxiety medication offered. Waiting on MD.

## 2011-06-19 NOTE — Progress Notes (Signed)
Patient is discharge today, awaiting on CT Scan before discharge

## 2011-06-19 NOTE — Progress Notes (Signed)
Patient ID: James Juarez, male   DOB: 1940-03-17, 71 y.o.   MRN: [id number] I reviewed this patient's head CT, and it shows a reaccumulation of hypodense fluid in the left frontal region with an increase in midline shift from 5 mm to 9 mm. Clinically he looks fantastic. He has no headache or numbness tingling or weakness or visual changes. He is awake and alert and interactive with a non-focal motor exam. No aphasia. I had a long discussion with him and his family members regarding this reaccumulation of fluid. I have also spoken with Dr. Wynetta Emery. I feel at this time we should continue to watch this and I would like to repeat his head CT in 48 hours, sooner if need be. Clinically he looks fantastic, makes me think this is not a reaccumulation of chronic subdural hematoma fluid. It also seems very early for this to be chronic blood accumulation, it is more likely a subdural hygroma or CSF collection or even external hydrocephalus though his lack of symptoms would not be overly consistent with external hydrocephalus. This is obviously a low-pressure system. I've explained all this to the family in detail. I've tried to answer all of their questions to best my ability.

## 2011-06-19 NOTE — Discharge Instructions (Signed)
No lifting no bending no twisting no driving keep incision drySubdural Hematoma Evacuation A subdural hematoma is a collection of blood between the brain and its tough outer covering membrane (dura). This is caused by bleeding (hemorrhage) from a ruptured blood vessel. There are two types of subdural hematomas.  Acute subdural hemorrhage. This is subdural bleeding that develops shortly after a serious blow to the head. Blood collects very fast in this injury and may cause the pressure to rise within the brain. If not diagnosed and treated promptly, severe brain injury or death can occur.   Chronic subdural hemorrhage. This is when bleeding develops slowly, over weeks to months.  The brain takes up all the space in the skull, so there is no room for blood clots. If blood clots are present, they push down on the brain. The bigger the clot, the more it pushes on the brain. The brain gets irritated when touched by blood and can stop working. Depending on the part of the brain that stops working, that is the function the patient will lose. A blood clot can compress the brain and eventually cause death. If your caregiver thinks there is risk of this, he or she will recommend an evacuation of the blood. This is an operation, under general anesthesia, that involves opening the side skull and washing off the blood. LET YOUR CAREGIVER KNOW ABOUT:   Allergies.   Medicines you take, including herbs, eye drops, over-the-counter medicines, and creams.   Use of steroids (by mouth or creams).   Past problems with anesthetics.   Possibility of pregnancy.   History of blood clots (thrombophlebitis).   History of bleeding or blood problems.   Past surgery.   Other health problems.  RISKS AND COMPLICATIONS   Infection.   Allergic reaction to the anesthetic or other medicines given.   Brain damage from the surgery or stroke. The result of this complication depends on what part of the brain was injured.     Bleeding following surgery.   Seizures from brain irritability.   Brain swelling.  Your surgeon takes every precaution to keep these things from happening. BEFORE THE PROCEDURE   If this is an acute subdural hemorrhage, the procedure is done as an emergency procedure.   For chronic subdural hemorrhage, the surgery is done by choice, and it is important that you:   Do not eat for 24 hours before surgery.   Do not use blood thinners for 1-2 weeks before surgery.   Do not use anti-diabetes medicine the morning of surgery.   Follow any further instructions provided by your caregiver.  PROCEDURE   You will be given a medicine to help you sleep (general anesthetic).   Your scalp will be shaved and prepped over the site picked for the craniotomy (opening the skull).   A flap is cut in the scalp and burr holes are drilled, and then connected, by using a bone saw.   After the bone flap is removed, the blood is washed off your brain using water with antibiotics.   After the procedure is finished, the bone is replaced. The scalp is then stitched back together.   A drain is placed inside the head to remove blood or fluids that may collect following surgery.  AFTER THE PROCEDURE   You will be taken to the recovery area, where a nurse will watch and check your progress.   Once you are awake, stable, and taking fluids well, if there are no other problems,  you will be allowed to return to your room.   Your hospital stay may vary from five days to a couple weeks. This depends on the type of surgery, brain damage found at the time of surgery, and if there are complications that require further surgeries.   Some patients, especially those who were in a coma before surgery, will be sent to the intensive care unit (ICU). Many of these patients will be kept on the breathing tube from surgery, because removing it is too dangerous. In the ICU, nurses will watch the patient very closely, and the  patient may require a pressure monitor to be placed in his/her skull. The concern is that the patient could have another bleed or stroke.   Stitches or staples are usually removed about two weeks after surgery.  HOME CARE INSTRUCTIONS  For two weeks, do not soak the surgery site under water. Swimming and baths should be avoided. Showers are ok, but rinse off the incision sites.   Take antibiotics or anti-seizure medicine as prescribed by your caregiver.  SEEK IMMEDIATE MEDICAL CARE IF:   There is any drainage of fluids from the incision site.   The incision site starts to get red.   The incision site opens up.   Your symptoms return.   Signs and symptoms to watch out for:   Headaches.   Nausea and throwing up (vomiting).   Fever.   Vision problems.  Document Released: 10/23/2008 Document Revised: 12/15/2010 Document Reviewed: 10/28/2008 [facility] Patient Information 2012 [place], [place].

## 2011-06-19 NOTE — Discharge Summary (Signed)
Physician Discharge Summary  Patient ID: James Juarez MRN: [id number] DOB/AGE: 04-16-1940 71 y.o.  Admit date: 06/14/2011 Discharge date: 06/19/2011  Admission Diagnoses: Subacute subdural hematoma  Discharge Diagnoses: Same Active Problems:  * No active hospital problems. *    Discharged Condition: good  Hospital Course: Patient was admitted and directly the hospital after CT diagnosed a recurrence of his left subacute subdural hematoma patient was emergently taken the operative underwent craniotomy for evacuation of subdural postoperatively patient did very well recovered in the ICU and the [facility] was evaluated with followup CT scan as well as MRI scan which shows significant evacuation of a subdural hematoma with a small amount of residual fluid. Patient continued to clinically improve with angling and voiding spontaneously by postoperative day 5 ball CT scan was ordered resume 5 if this is still patient discharged home discharge and followed proximally one week patient was instructed to keep his and keep the incision dry.  Consults: Significant Diagnostic Studies: Treatments: Craniotomy for left subdural hematoma Discharge Exam: Blood pressure 120/71, pulse 78, temperature 98.1 F (36.7 C), temperature source Oral, resp. rate 18, height 5\' 10"  (1.778 m), weight 74.2 kg (163 lb 9.3 oz), SpO2 97.00%. Awake alert oriented strength out of 5 wound clean and dry  Disposition:  home   Medication List  As of 06/19/2011  8:42 AM   TAKE these medications         acetaminophen 325 MG tablet   Commonly known as: TYLENOL   Take 325 mg by mouth every 6 (six) hours as needed. For pain      albuterol 108 (90 BASE) MCG/ACT inhaler   Commonly known as: PROVENTIL HFA;VENTOLIN HFA   Inhale 2 puffs into the lungs every 6 (six) hours as needed. For shortness of breath      ALPRAZolam 0.25 MG tablet   Commonly known as: XANAX   Take 1 tablet (0.25 mg total) by mouth 3 (three) times daily as needed for  anxiety.      clobetasol 0.05 % Gel   Commonly known as: TEMOVATE   Apply 1 application topically daily as needed. For face.      fexofenadine 180 MG tablet   Commonly known as: ALLEGRA   Take 180 mg by mouth daily as needed. For allergies      glipiZIDE 5 MG 24 hr tablet   Commonly known as: GLUCOTROL XL   Take 5 mg by mouth daily.      HYDROcodone-acetaminophen 5-325 MG per tablet   Commonly known as: NORCO   Take 1 tablet by mouth every 4 (four) hours as needed.      levothyroxine 75 MCG tablet   Commonly known as: SYNTHROID, LEVOTHROID   Take 75 mcg by mouth daily.      losartan 50 MG tablet   Commonly known as: COZAAR   Take 50 mg by mouth daily.      multivitamin with minerals Tabs   Take 1 tablet by mouth daily.      pioglitazone 30 MG tablet   Commonly known as: ACTOS   Take 30 mg by mouth daily.      rosuvastatin 5 MG tablet   Commonly known as: CRESTOR   Take 5 mg by mouth daily.      sitaGLIPtan-metformin 50-1000 MG per tablet   Commonly known as: JANUMET   Take 1 tablet by mouth 2 (two) times daily with a meal.  Signed: Belky Mundo P 06/19/2011, 8:42 AM

## 2011-06-20 LAB — GLUCOSE, CAPILLARY
Glucose-Capillary: 131 mg/dL — ABNORMAL HIGH (ref 70–99)
Glucose-Capillary: 86 mg/dL (ref 70–99)
Glucose-Capillary: 101 mg/dL — ABNORMAL HIGH (ref 70–99)
Glucose-Capillary: 96 mg/dL (ref 70–99)
Glucose-Capillary: 77 mg/dL (ref 70–99)

## 2011-06-20 NOTE — Progress Notes (Signed)
Occupational Therapy Note   06/20/11 1536  Vision - History  Baseline Vision Wears glasses only for reading  Visual History Brain injury  Patient Visual Report Other (comment) (Pt reports no change from baseline.)  Vision - Assessment  Eye Alignment WFL  Vision Assessment Vision tested  Ocular Range of Motion [facility]  Tracking/Visual Pursuits Decreased smoothness of vertical tracking;Requires cues, head turns, or add eye shifts to track;Decreased smoothness of horizontal tracking  Saccades Additional eye shifts occurred during testing;Additional head turns occurred during testing  Convergence Impaired (comment)  Additional Comments Pt had difficulty with convergence of R eye although. Despite issues with tracking pt was able to complete letter/# cancellation exercise with only 2 mistakes. Next session to focus on locating ADL items in busy environments.  Bed Mobility  Supine to Sit 7: Independent  Transfers  Sit to Stand 7: Independent  Stand to Sit 7: Independent   Addendum to tx note on 06/20/11 Garrel Ridgel, OTR/L

## 2011-06-20 NOTE — Care Management Note (Signed)
    Page 1 of 1   06/20/2011     10:54:27 AM   CARE MANAGEMENT NOTE 06/20/2011  Patient:  James Juarez, James Juarez   Account Number:  [id number]  Date Initiated:  06/15/2011  Documentation initiated by:  [facility]  Subjective/Objective Assessment:   Admitted with recurrent SDH postop craniotomy.     Action/Plan:   PT eval-  OT eval   Anticipated DC Date:  05/18/2011   Anticipated DC Plan:  HOME W HOME HEALTH SERVICES      DC Planning Services  CM consult      Choice offered to / List presented to:             Status of service:  In process, will continue to follow Medicare Important Message given?   (If response is "NO", the following Medicare IM given date fields will be blank) Date Medicare IM given:   Date Additional Medicare IM given:    Discharge Disposition:  HOME/SELF CARE  Per UR Regulation:  Reviewed for med. necessity/level of care/duration of stay  If discussed at Long Length of Stay Meetings, dates discussed:    Comments:  06/20/11 Onnie Boer, RN, BSN 1052 PT WAS NOT DC'D YESTERDAY D/T CT RESULTS.  PT IS TO HAVE REPEAT CT ON TOMORROW.  PT HAS SOME FLUID COLLECTION AND VERY MILD ISSUE WITH SPEECH.  IF CT SCAN SHOWS MORE CHANGES PT WILL GO TO SURG TOMORROW AND WILL BE NPO AFTER [place] TONIGHT.  IF NO CHANGES PT MAY BE ABLE TO BE DC'D.  WILL F/U.  06/19/11 Onnie Boer, RN, BSN 1532 PT WAS DC'D TO HOME WITH SELF CARE

## 2011-06-20 NOTE — Progress Notes (Signed)
Occupational Therapy Treatment Patient Details Name: James Juarez MRN: [id number] DOB: [birth year] Today's Date: 06/20/2011 Time: 1478-2956 OT Time Calculation (min): 39 min  OT Assessment / Plan / Recommendation Comments on Treatment Session      Follow Up Recommendations  No OT follow up vs Outpatient OT    Barriers to Discharge       Equipment Recommendations  None recommended by OT    Recommendations for Other Services    Frequency Min 2X/week   Plan      Precautions / Restrictions     Pertinent Vitals/Pain     ADL  Grooming: Performed;Independent;Shaving;Wash/dry face;Teeth care Where Assessed - Grooming: Unsupported standing Upper Body Bathing: Performed;Independent Where Assessed - Upper Body Bathing: Unsupported standing Lower Body Bathing: Performed;Independent Where Assessed - Lower Body Bathing: Unsupported standing Upper Body Dressing: Performed;Independent Where Assessed - Upper Body Dressing: Unsupported standing Lower Body Dressing: Performed;Independent Where Assessed - Lower Body Dressing: Supported standing ADL Comments: Pt was able to successfully follow signs to navigate his way around the unit using signs. Pt refused to attempt to read map stating, "he was just not good with them." Pt was also able to successfully scan a somewhat cluttered countertop and locate all ADL items.    OT Diagnosis: Disturbance of vision  OT Problem List: Impaired vision/perception;Decreased cognition OT Treatment Interventions: Cognitive remediation/compensation;Visual/perceptual remediation/compensation   OT Goals ADL Goals ADL Goal: Upper Body Bathing - Progress: Met ADL Goal: Lower Body Bathing - Progress: Met ADL Goal: Upper Body Dressing - Progress: Met ADL Goal: Lower Body Dressing - Progress: Met ADL Goal: Toilet Transfer - Progress: Met ADL Goal: Toileting - Clothing Manipulation - Progress: Met ADL Goal: Toileting - Hygiene - Progress: Met ADL Goal: Additional  Goal #1 - Progress: Progressing toward goals Miscellaneous OT Goals Miscellaneous OT Goal #1: Pt will visually scan & locate familiar ADL items with 100% accuracy in an unstructured, unfamiliar environment with a min amount of VCs. OT Goal: Miscellaneous Goal #1 - Progress: Goal set today  Visit Information  Last OT Received On: 06/20/11 Assistance Needed: +1    Subjective Data  Subjective: I dont want to play any map games today. Im just not good with maps.   Prior Functioning       Cognition  Overall Cognitive Status: Appears within functional limits for tasks assessed/performed Arousal/Alertness: Awake/alert Orientation Level: Appears intact for tasks assessed Behavior During Session: [facility] for tasks performed    Mobility Bed Mobility Supine to Sit: 7: Independent Transfers Sit to Stand: 7: Independent Stand to Sit: 7: Independent   Exercises    Balance    End of Session     Latron Ribas A OTR/L [phone] 06/20/2011, 3:49 PM

## 2011-06-20 NOTE — Progress Notes (Signed)
Patient ID: James Juarez, male   DOB: 1940/01/11, 71 y.o.   MRN: [id number] Subjective: Patient reports maybe some very mild issues with speech. No headache. No N/T/W.  Objective: Vital signs in last 24 hours: Temp:  [97.8 F (36.6 C)-98.6 F (37 C)] 98 F (36.7 C) (06/11 0551) Pulse Rate:  [71-80] 79  (06/11 0551) Resp:  [16-18] 18  (06/11 0551) BP: (110-134)/(68-76) 133/74 mmHg (06/11 0551) SpO2:  [98 %-100 %] 98 % (06/11 0551)  Intake/Output from previous day: 06/10 0701 - 06/11 0700 In: 600 [P.O.:600] Out: -  Intake/Output this shift:    Neurologic: Grossly normal, awake/ alert, conversant with maybe some mild hesitancy, but good repetition, naming, comprehension. No drift.  Lab Results: Lab Results  Component Value Date   WBC 5.6 06/14/2011   HGB 8.8* 06/14/2011   HCT 27.1* 06/14/2011   MCV 88.9 06/14/2011   PLT 224 06/14/2011   Lab Results  Component Value Date   INR 1.05 06/14/2011   BMET Lab Results  Component Value Date   NA 138 06/14/2011   K 4.1 06/14/2011   CL 105 06/14/2011   CO2 23 06/14/2011   GLUCOSE 88 06/14/2011   BUN 16 06/14/2011   CREATININE 1.11 06/14/2011   CALCIUM 8.7 06/14/2011    Studies/Results: Ct Head Wo Contrast  06/19/2011  *RADIOLOGY REPORT*  Clinical Data: Follow-up subdural hematoma  CT HEAD WITHOUT CONTRAST  Technique:  Contiguous axial images were obtained from the base of the skull through the vertex without contrast.  Comparison: 06/15/2011  Findings: Left sided subdural drain has been removed.  There has been some reaccumulation of mixed density subdural material on the left.  In the frontal region, thickness was previously approximately 5 mm and today measures 14 mm.  Midline shift has increased from 5 mm to 9 mm left to right.  No evidence of infarction.  No collection on the right.  IMPRESSION: Left sided subdural drain has been removed.  Some reaccumulation of mixed density subdural material on the left with increase in midline shift from 5 mm to 9 mm.   Original Report Authenticated By: Thomasenia Sales, M.D.    Assessment/Plan: Head CT tomorrow, seems to be doing well.  LOS: 6 days    Moriyah Byington S 06/20/2011, 7:44 AM

## 2011-06-21 ENCOUNTER — Inpatient Hospital Stay (HOSPITAL_COMMUNITY): Payer: Medicare Other

## 2011-06-21 ENCOUNTER — Emergency Department (HOSPITAL_COMMUNITY)
Admission: EM | Admit: 2011-06-21 | Discharge: 2011-06-21 | Disposition: A | Discharge status: Home / Self Care | Payer: Medicare Other | Attending: Emergency Medicine | Admitting: Emergency Medicine

## 2011-06-21 ENCOUNTER — Emergency Department (HOSPITAL_COMMUNITY)
Admission: EM | Admit: 2011-06-21 | Discharge: 2011-06-21 | Disposition: A | Discharge status: Home / Self Care | Payer: Medicare Other

## 2011-06-21 ENCOUNTER — Encounter (HOSPITAL_COMMUNITY): Payer: Self-pay | Admitting: Emergency Medicine

## 2011-06-21 ENCOUNTER — Encounter (HOSPITAL_COMMUNITY): Payer: Self-pay | Admitting: Radiology

## 2011-06-21 ENCOUNTER — Emergency Department (HOSPITAL_COMMUNITY): Payer: Medicare Other

## 2011-06-21 DIAGNOSIS — G459 Transient cerebral ischemic attack, unspecified: Secondary | ICD-10-CM | POA: Insufficient documentation

## 2011-06-21 DIAGNOSIS — R4701 Aphasia: Secondary | ICD-10-CM | POA: Diagnosis not present

## 2011-06-21 DIAGNOSIS — I6200 Nontraumatic subdural hemorrhage, unspecified: Secondary | ICD-10-CM | POA: Diagnosis not present

## 2011-06-21 DIAGNOSIS — Z79899 Other long term (current) drug therapy: Secondary | ICD-10-CM | POA: Insufficient documentation

## 2011-06-21 DIAGNOSIS — I10 Essential (primary) hypertension: Secondary | ICD-10-CM | POA: Insufficient documentation

## 2011-06-21 DIAGNOSIS — E119 Type 2 diabetes mellitus without complications: Secondary | ICD-10-CM | POA: Insufficient documentation

## 2011-06-21 DIAGNOSIS — Z9889 Other specified postprocedural states: Secondary | ICD-10-CM | POA: Insufficient documentation

## 2011-06-21 DIAGNOSIS — R4789 Other speech disturbances: Secondary | ICD-10-CM | POA: Diagnosis not present

## 2011-06-21 LAB — GLUCOSE, CAPILLARY
Glucose-Capillary: 97 mg/dL (ref 70–99)
Glucose-Capillary: 155 mg/dL — ABNORMAL HIGH (ref 70–99)

## 2011-06-21 LAB — BASIC METABOLIC PANEL
BUN: 18 mg/dL (ref 6–23)
CO2: 27 mEq/L (ref 19–32)
Calcium: 9.3 mg/dL (ref 8.4–10.5)
Chloride: 97 mEq/L (ref 96–112)
Creatinine, Ser: 1.31 mg/dL (ref 0.50–1.35)
GFR calc Af Amer: 61 mL/min — ABNORMAL LOW (ref >90)
GFR calc non Af Amer: 52 mL/min — ABNORMAL LOW (ref >90)
Glucose, Bld: 93 mg/dL (ref 70–99)
Potassium: 4.2 mEq/L (ref 3.5–5.1)
Sodium: 134 mEq/L — ABNORMAL LOW (ref 135–145)

## 2011-06-21 LAB — CBC
HCT: 29.9 % — ABNORMAL LOW (ref 39.0–52.0)
Hemoglobin: 9.8 g/dL — ABNORMAL LOW (ref 13.0–17.0)
MCH: 28.8 pg (ref 26.0–34.0)
MCHC: 32.8 g/dL (ref 30.0–36.0)
MCV: 87.9 fL (ref 78.0–100.0)
Platelets: 228 10*3/uL (ref 150–400)
RBC: 3.4 MIL/uL — ABNORMAL LOW (ref 4.22–5.81)
RDW: 14.6 % (ref 11.5–15.5)
WBC: 6.3 10*3/uL (ref 4.0–10.5)

## 2011-06-21 LAB — PROTIME-INR
INR: 0.97 (ref 0.00–1.49)
Prothrombin Time: 13.1 seconds (ref 11.6–15.2)

## 2011-06-21 NOTE — Progress Notes (Signed)
OT Cancellation Note  Treatment cancelled today due to patient receiving procedure or test. Pt currently at CT scan. Will check back as schedule permits.  Drevin Ortner A OTR/L [phone] 06/21/2011, 1:55 PM

## 2011-06-21 NOTE — ED Provider Notes (Signed)
History     CSN: [id number]  Arrival date & time 06/21/11  1845   First MD Initiated Contact with Patient 06/21/11 1916      Chief Complaint  Patient presents with  . Aphasia    (Consider location/radiation/quality/duration/timing/severity/associated sxs/prior treatment) HPI  Patient is here from home after having been discharged this evening s/p craniotomy after a subdural bleed was found on the 5th of this month. The patient states that when he got home 3 stressful things happened back to back. He decided to go upstairs to rest, after about 20-30 minutes of resting he had a period of 10-15 minutes where he was unable to speak. Therefore the patients family members rushed him to the ER. Upon arriving to the ED that patients symptoms have completely resolved. The surgery was done by Dr. Lucila Maine. Patients symptoms have completely resolved upon arriving to the ED. He is back and baseline, NAD and hemodynamically stable.  Past Medical History  Diagnosis Date  . Diabetes mellitus   . Hypertension   . Asthma   . Diverticular disease   . Subdural hematoma May 2013    bilateral     Past Surgical History  Procedure Date  . Hernia repair   . Burr hole 05/22/2011    Procedure: [name];  Surgeon: Mariam Dollar, MD;  Location: [facility];  Service: Neurosurgery;  Laterality: Right;  Right Burr Holes for evacuation of subdural hematoma  . Burr hole 05/23/2011    Procedure: [name];  Surgeon: Mariam Dollar, MD;  Location: [facility];  Service: Neurosurgery;  Laterality: Left;  Left [facility]  . Craniotomy 06/14/2011    Procedure: CRANIOTOMY HEMATOMA EVACUATION SUBDURAL;  Surgeon: Mariam Dollar, MD;  Location: [facility];  Service: Neurosurgery;  Laterality: N/A;  Left Craniotomy for subdural hematoma    History reviewed. No pertinent family history.  History  Substance Use Topics  . Smoking status: Former Games developer  . Smokeless tobacco: Former Neurosurgeon    Quit date: 01/09/1978  . Alcohol  Use: No      Review of Systems   HEENT: denies blurry vision or change in hearing PULMONARY: Denies difficulty breathing and SOB CARDIAC: denies chest pain or heart palpitations MUSCULOSKELETAL:  denies being unable to ambulate ABDOMEN AL: denies abdominal pain GU: denies loss of bowel or urinary control NEURO: denies numbness and tingling in extremities SKIN: no new rashes PSYCH: patient behavior is normal NECK: No neck pain     Allergies  Review of patient's allergies indicates no known allergies.  Home Medications   Current Outpatient Rx  Name Route Sig Dispense Refill  . ACETAMINOPHEN 325 MG PO TABS Oral Take 325 mg by mouth every 6 (six) hours as needed. For pain    . ALBUTEROL SULFATE HFA 108 (90 BASE) MCG/ACT IN AERS Inhalation Inhale 2 puffs into the lungs every 6 (six) hours as needed. For shortness of breath    . ALPRAZOLAM 0.25 MG PO TABS Oral Take 1 tablet (0.25 mg total) by mouth 3 (three) times daily as needed for anxiety. 60 tablet 1  . CLOBETASOL PROPIONATE 0.05 % EX GEL Topical Apply 1 application topically daily as needed. For face.    [name] Kitchen FEXOFENADINE HCL 180 MG PO TABS Oral Take 180 mg by mouth daily as needed. For allergies    . GLIPIZIDE ER 5 MG PO TB24 Oral Take 5 mg by mouth daily.    [name] Kitchen HYDROCODONE-ACETAMINOPHEN 5-325 MG PO TABS Oral Take  1 tablet by mouth every 4 (four) hours as needed. 60 tablet 1  . LEVOTHYROXINE SODIUM 75 MCG PO TABS Oral Take 75 mcg by mouth daily.    [name] Kitchen LOSARTAN POTASSIUM 50 MG PO TABS Oral Take 50 mg by mouth daily.    . ADULT MULTIVITAMIN W/MINERALS CH Oral Take 1 tablet by mouth daily.    [name] Kitchen PIOGLITAZONE HCL 30 MG PO TABS Oral Take 30 mg by mouth daily.    [name] Kitchen ROSUVASTATIN CALCIUM 5 MG PO TABS Oral Take 5 mg by mouth daily.    [name] Kitchen SITAGLIPTIN-METFORMIN HCL 50-1000 MG PO TABS Oral Take 1 tablet by mouth 2 (two) times daily with a meal.      BP 130/73  Pulse 78  Temp 98 F (36.7 C)  Resp 23  SpO2 98%  Physical Exam  Nursing  note and vitals reviewed. Constitutional: He is oriented to person, place, and time. He appears well-developed and well-nourished. No distress.  HENT:  Head: Normocephalic and atraumatic.    Eyes: Pupils are equal, round, and reactive to light.  Neck: Normal range of motion. Neck supple.  Cardiovascular: Normal rate and regular rhythm.   Pulmonary/Chest: Effort normal.  Abdominal: Soft.  Neurological: He is alert and oriented to person, place, and time. No cranial nerve deficit or sensory deficit.  Skin: Skin is warm and dry.    ED Course  Procedures (including critical care time)   Labs Reviewed  PROTIME-INR  CBC  BASIC METABOLIC PANEL   Ct Head Wo Contrast  06/21/2011  *RADIOLOGY REPORT*  Clinical Data: Follow-up subdural hematoma  CT HEAD WITHOUT CONTRAST  Technique:  Contiguous axial images were obtained from the base of the skull through the vertex without contrast.  Comparison: CT 06/19/2011  Findings: Mixed density subdural hematoma left frontal and parietal lobes is similar to the prior study.  This measures 16 mm in thickness in the frontal region and 8 mm in the parietal region. There is gas in the subdural space related to recent craniotomy.  Midline shift measures 8 mm and is unchanged from the prior study. No new area of hemorrhage or infarction is identified.  Ventricles are not enlarged.  IMPRESSION: Moderately large mixed density subdural hematoma on the left is unchanged.  8 mm midline shift is unchanged.  Original Report Authenticated By: Camelia Phenes, M.D.     No diagnosis found.    MDM  Dr. Radford Pax has called Neurosurgery on call who is currently in surgery.  Patient work-up initiated, pt/inr, ct head wo contrast, cbc, bmp    Date: 06/21/2011  Rate: 77  Rhythm: normal sinus rhythm  QRS Axis: normal  Intervals: PR prolonged  ST/T Wave abnormalities: normal  Conduction Disutrbances:none  Narrative Interpretation:   Old EKG Reviewed: unchanged from  June 15, 2011   I have spoken with Dr. Dyann Kief nurse. Patient back to baseline. Therefore, if symptoms returned he is advised to return to the ED should symptoms reoccur.   Dr. Radford Pax has gone and spoken with the patient and family and has an extended conversation with them and answered their many questions.  Patient to return to the ED as needed. Dr. Danielle Dess has written down patients name and will let Dr. Wynetta Emery know.  Pt has been advised of the symptoms that warrant their return to the ED. Patient has voiced understanding and has agreed to follow-up with the PCP or specialist.     Dorthula Matas, PA 06/21/11 2244

## 2011-06-21 NOTE — Progress Notes (Signed)
After being d.c. Patient's family called back stated that after laying down, patient experienced Slurred Speech, patient was advised to come the emergency room, MD on call (Dr. Danielle Dess) was notified, and said that the patient should come back to the ED.  Patient's family called stating they were not going to bring the patient back, until somebody from Risk Management was called.  At the time of discharge, patient was stable, vitals were stable, neuro intact, A/O =4, MOE=4, patient was given d/c instructions and did not express any questions/concerns.

## 2011-06-21 NOTE — ED Notes (Signed)
Pt here for slurred speech x ; pt discharged today after craniotomy from head bleed; pt sts went home and took nap and woke up slurred speech which had been occuring while in hospital; pt called to 3000 and spoke with RN and 3000 RN paged neuro sx; EDP to see; pt alert at present and able to answer questions

## 2011-06-21 NOTE — ED Notes (Signed)
T. GREENE PA AT BEDSIDE SPEAKING WITH PT. AND FAMILY.

## 2011-06-21 NOTE — ED Notes (Signed)
DR. Lourena Simmonds ( EDP) AT BEDSIDE SPEAKING WITH PT. AND FAMILY.

## 2011-06-21 NOTE — ED Notes (Signed)
DR. Danielle Dess IS CURRENTLY PERFORMING SURGERY AT THIS TIME , ADVISED ER NURSE THAT HE WILL SEE PT. AS SOON AS POSSIBLE.

## 2011-06-21 NOTE — Discharge Summary (Signed)
Physician Discharge Summary  Patient ID: James Juarez MRN: [id number] DOB/AGE: [birth year] 71 y.o.  Admit date: 06/14/2011 Discharge date: 06/21/2011  Admission Diagnoses: L SDH    Discharge Diagnoses: same   Discharged Condition: good  Hospital Course: The patient was admitted on 06/14/2011 and taken to the operating room where the patient underwent L crani for SDH. The patient tolerated the procedure well and was taken to the recovery room and then to the ICU in stable condition. The hospital course was routine. There were no complications. The wound remained clean dry and intact. Pt had appropriate scalp soreness. No complaints new N/T/W. The patient remained afebrile with stable vital signs, and tolerated a regular diet. The patient continued to increase activities, and pain was well controlled with oral pain medications. His f'/u head CT did show an enlargement of the subdural fluid collection on 6/10. Repeat head CT 6/12 sowed no change and clinically he was doing very well, and it was felt he safe for D/C home with his attentive family offering 24 hr supervision.  Consults: None  Significant Diagnostic Studies:  Results for orders placed during the hospital encounter of 06/14/11  PROTIME-INR      Component Value Range   Prothrombin Time 13.9  11.6 - 15.2 seconds   INR 1.05  0.00 - 1.49  APTT      Component Value Range   aPTT 31  24 - 37 seconds  CBC      Component Value Range   WBC 5.5  4.0 - 10.5 K/uL   RBC 3.71 (*) 4.22 - 5.81 MIL/uL   Hemoglobin 11.0 (*) 13.0 - 17.0 g/dL   HCT 08.6 (*) 57.8 - 46.9 %   MCV 90.8  78.0 - 100.0 fL   MCH 29.6  26.0 - 34.0 pg   MCHC 32.6  30.0 - 36.0 g/dL   RDW 62.9  52.8 - 41.3 %   Platelets 305  150 - 400 K/uL  DIFFERENTIAL      Component Value Range   Neutrophils Relative 70  43 - 77 %   Neutro Abs 3.9  1.7 - 7.7 K/uL   Lymphocytes Relative 22  12 - 46 %   Lymphs Abs 1.2  0.7 - 4.0 K/uL   Monocytes Relative 6  3 - 12 %   Monocytes  Absolute 0.4  0.1 - 1.0 K/uL   Eosinophils Relative 1  0 - 5 %   Eosinophils Absolute 0.1  0.0 - 0.7 K/uL   Basophils Relative 0  0 - 1 %   Basophils Absolute 0.0  0.0 - 0.1 K/uL  CBC      Component Value Range   WBC 6.2  4.0 - 10.5 K/uL   RBC 3.84 (*) 4.22 - 5.81 MIL/uL   Hemoglobin 11.1 (*) 13.0 - 17.0 g/dL   HCT 24.4 (*) 01.0 - 27.2 %   MCV 90.1  78.0 - 100.0 fL   MCH 28.9  26.0 - 34.0 pg   MCHC 32.1  30.0 - 36.0 g/dL   RDW 53.6  64.4 - 03.4 %   Platelets 295  150 - 400 K/uL  PROTIME-INR      Component Value Range   Prothrombin Time 12.5  11.6 - 15.2 seconds   INR 0.91  0.00 - 1.49  SURGICAL PCR SCREEN      Component Value Range   MRSA, PCR NEGATIVE  NEGATIVE   Staphylococcus aureus NEGATIVE  NEGATIVE  COMPREHENSIVE METABOLIC PANEL  Component Value Range   Sodium 137  135 - 145 mEq/L   Potassium 4.2  3.5 - 5.1 mEq/L   Chloride 101  96 - 112 mEq/L   CO2 26  19 - 32 mEq/L   Glucose, Bld 94  70 - 99 mg/dL   BUN 19  6 - 23 mg/dL   Creatinine, Ser 9.60  0.50 - 1.35 mg/dL   Calcium 9.5  8.4 - 45.4 mg/dL   Total Protein 7.2  6.0 - 8.3 g/dL   Albumin 3.8  3.5 - 5.2 g/dL   AST 18  0 - 37 U/L   ALT 11  0 - 53 U/L   Alkaline Phosphatase 66  39 - 117 U/L   Total Bilirubin 0.3  0.3 - 1.2 mg/dL   GFR calc non Af Amer 63 (*) >90 mL/min   GFR calc Af Amer 73 (*) >90 mL/min  GLUCOSE, CAPILLARY      Component Value Range   Glucose-Capillary 85  70 - 99 mg/dL  BASIC METABOLIC PANEL      Component Value Range   Sodium 138  135 - 145 mEq/L   Potassium 4.1  3.5 - 5.1 mEq/L   Chloride 105  96 - 112 mEq/L   CO2 23  19 - 32 mEq/L   Glucose, Bld 88  70 - 99 mg/dL   BUN 16  6 - 23 mg/dL   Creatinine, Ser 0.98  0.50 - 1.35 mg/dL   Calcium 8.7  8.4 - 11.9 mg/dL   GFR calc non Af Amer 64 (*) >90 mL/min   GFR calc Af Amer 74 (*) >90 mL/min  CBC      Component Value Range   WBC 5.6  4.0 - 10.5 K/uL   RBC 3.05 (*) 4.22 - 5.81 MIL/uL   Hemoglobin 8.8 (*) 13.0 - 17.0 g/dL   HCT 14.7  (*) 82.9 - 52.0 %   MCV 88.9  78.0 - 100.0 fL   MCH 28.9  26.0 - 34.0 pg   MCHC 32.5  30.0 - 36.0 g/dL   RDW 56.2  13.0 - 86.5 %   Platelets 224  150 - 400 K/uL  GLUCOSE, CAPILLARY      Component Value Range   Glucose-Capillary 119 (*) 70 - 99 mg/dL  GLUCOSE, CAPILLARY      Component Value Range   Glucose-Capillary 135 (*) 70 - 99 mg/dL  GLUCOSE, CAPILLARY      Component Value Range   Glucose-Capillary 120 (*) 70 - 99 mg/dL   Comment 1 Notify RN     Comment 2 Documented in Chart    GLUCOSE, CAPILLARY      Component Value Range   Glucose-Capillary 125 (*) 70 - 99 mg/dL   Comment 1 Notify RN     Comment 2 Documented in Chart    GLUCOSE, CAPILLARY      Component Value Range   Glucose-Capillary 118 (*) 70 - 99 mg/dL  GLUCOSE, CAPILLARY      Component Value Range   Glucose-Capillary 116 (*) 70 - 99 mg/dL  GLUCOSE, CAPILLARY      Component Value Range   Glucose-Capillary 76  70 - 99 mg/dL   Comment 1 Notify RN     Comment 2 Documented in Chart    GLUCOSE, CAPILLARY      Component Value Range   Glucose-Capillary 91  70 - 99 mg/dL   Comment 1 Notify RN     Comment 2 Documented in Chart  GLUCOSE, CAPILLARY      Component Value Range   Glucose-Capillary 89  70 - 99 mg/dL  GLUCOSE, CAPILLARY      Component Value Range   Glucose-Capillary 70  70 - 99 mg/dL  GLUCOSE, CAPILLARY      Component Value Range   Glucose-Capillary 87  70 - 99 mg/dL  GLUCOSE, CAPILLARY      Component Value Range   Glucose-Capillary 82  70 - 99 mg/dL  GLUCOSE, CAPILLARY      Component Value Range   Glucose-Capillary 89  70 - 99 mg/dL  GLUCOSE, CAPILLARY      Component Value Range   Glucose-Capillary 73  70 - 99 mg/dL  GLUCOSE, CAPILLARY      Component Value Range   Glucose-Capillary 88  70 - 99 mg/dL  GLUCOSE, CAPILLARY      Component Value Range   Glucose-Capillary 97  70 - 99 mg/dL  GLUCOSE, CAPILLARY      Component Value Range   Glucose-Capillary 117 (*) 70 - 99 mg/dL  GLUCOSE,  CAPILLARY      Component Value Range   Glucose-Capillary 99  70 - 99 mg/dL  GLUCOSE, CAPILLARY      Component Value Range   Glucose-Capillary 87  70 - 99 mg/dL  GLUCOSE, CAPILLARY      Component Value Range   Glucose-Capillary 131 (*) 70 - 99 mg/dL  GLUCOSE, CAPILLARY      Component Value Range   Glucose-Capillary 86  70 - 99 mg/dL  GLUCOSE, CAPILLARY      Component Value Range   Glucose-Capillary 101 (*) 70 - 99 mg/dL   Comment 1 Notify RN     Comment 2 Documented in Chart    GLUCOSE, CAPILLARY      Component Value Range   Glucose-Capillary 96  70 - 99 mg/dL  GLUCOSE, CAPILLARY      Component Value Range   Glucose-Capillary 77  70 - 99 mg/dL   Comment 1 Documented in Chart     Comment 2 Notify RN    GLUCOSE, CAPILLARY      Component Value Range   Glucose-Capillary 97  70 - 99 mg/dL   Comment 1 Documented in Chart     Comment 2 Notify RN    GLUCOSE, CAPILLARY      Component Value Range   Glucose-Capillary 155 (*) 70 - 99 mg/dL    Ct Head Wo Contrast  06/21/2011  *RADIOLOGY REPORT*  Clinical Data: Follow-up subdural hematoma  CT HEAD WITHOUT CONTRAST  Technique:  Contiguous axial images were obtained from the base of the skull through the vertex without contrast.  Comparison: CT 06/19/2011  Findings: Mixed density subdural hematoma left frontal and parietal lobes is similar to the prior study.  This measures 16 mm in thickness in the frontal region and 8 mm in the parietal region. There is gas in the subdural space related to recent craniotomy.  Midline shift measures 8 mm and is unchanged from the prior study. No new area of hemorrhage or infarction is identified.  Ventricles are not enlarged.  IMPRESSION: Moderately large mixed density subdural hematoma on the left is unchanged.  8 mm midline shift is unchanged.  Original Report Authenticated By: Camelia Phenes, M.D.   Ct Head Wo Contrast  06/19/2011  *RADIOLOGY REPORT*  Clinical Data: Follow-up subdural hematoma  CT HEAD  WITHOUT CONTRAST  Technique:  Contiguous axial images were obtained from the base of the skull through the vertex without  contrast.  Comparison: 06/15/2011  Findings: Left sided subdural drain has been removed.  There has been some reaccumulation of mixed density subdural material on the left.  In the frontal region, thickness was previously approximately 5 mm and today measures 14 mm.  Midline shift has increased from 5 mm to 9 mm left to right.  No evidence of infarction.  No collection on the right.  IMPRESSION: Left sided subdural drain has been removed.  Some reaccumulation of mixed density subdural material on the left with increase in midline shift from 5 mm to 9 mm.  Original Report Authenticated By: Thomasenia Sales, M.D.   Ct Head Wo Contrast  06/15/2011  *RADIOLOGY REPORT*  Clinical Data: Follow-up subdural hematoma.  CT HEAD WITHOUT CONTRAST  Technique:  Contiguous axial images were obtained from the base of the skull through the vertex without contrast.  Comparison: 06/14/2011.  Findings: Subsequent surgical changes from a craniotomy and evacuation of the subdural hematoma.  A subdural drainage catheter is in place and there is minimal residual fluid, blood and air. Minimal residual mass effect on the ventricles.  4.9 mm of left to right shift.  IMPRESSION: Interval surgical evacuation of the subdural hematoma with minimal residual air, blood and fluid.  Minimal residual mass effect on the ventricles.  Original Report Authenticated By: P. Loralie Champagne, M.D.   Ct Head Wo Contrast  06/14/2011  *RADIOLOGY REPORT*  Clinical Data: Subdural hemorrhage with headaches and weakness.  CT HEAD WITHOUT CONTRAST  Technique:  Contiguous axial images were obtained from the base of the skull through the vertex without contrast.  Comparison: Most recent 05/25/2011  Findings: There has been reaccumulation of the previously evacuated subdural hematoma on the left, now with areas of fluid which have both increased and  decreased attenuation.  The higher attenuation fluid is more peripheral suggesting re-bleed.  Maximum thickness of the left subdural is approximately 19 mm as seen on image 20. There is significant mass effect with 10 mm of left to right shift as measured at the level of the septum pellucidum.  There is no uncal herniation at this time.  There is no recurrence of the right sided extra-axial fluid collection.  IMPRESSION: Recurrent left subdural hematoma, nearly 2 cm thick, with approximately 1 cm left to right shift.  Original Report Authenticated By: Elsie Stain, M.D.   Ct Head Wo Contrast  05/25/2011  *RADIOLOGY REPORT*  Clinical Data: Follow-up subdural hematoma.  CT HEAD WITHOUT CONTRAST  Technique:  Contiguous axial images were obtained from the base of the skull through the vertex without contrast.  Comparison: Multiple priors, most recent 05/24/2011  Findings: Right subdural drain has been removed.  Continued improvement on the right with only minimal extra-axial fluid in the far anterior frontal region, with slight residual pneumocephalus.  Residual left frontal extra-axial fluid, similar in thickness to yesterday's study measuring 11 mm, with more generalized pneumocephalus adjacent to the left frontal burr hole. Also slight hyperdensity adjacent to the frontal burr hole.  Slight increased attenuation compared with yesterday's examination, now approaching 20 HU.Improved left to right shift of 2 mm, as compared to 3 mm yesterday.  No acute stroke or parenchymal hemorrhage.  Unchanged sinuses and mastoids.  IMPRESSION: Right subdural drain has been removed.  Continued improvement on the right.  Stable thickness of 11 mm left frontal extra-axial fluid collection, see comments above.  Original Report Authenticated By: Elsie Stain, M.D.   Ct Head Wo Contrast  05/24/2011  *RADIOLOGY  REPORT*  Clinical Data: Status post evacuation of initially  right and most recently left subdural hematomas  CT HEAD  WITHOUT CONTRAST  Technique:  Contiguous axial images were obtained from the base of the skull through the vertex without contrast.  Comparison: 05/22/2011, 05/23/2011  Findings: Improved appearance status post left frontal and parietal burr holes for evacuation of acute, subacute, and chronic left subdural hematoma.  Residual left frontal extra-axial collection 10 mm thick ( image 20, series 2) with fluid of low attenuation suggesting hygroma or irrigation fluid.  On the right, the subdural drain remains, with continued improvement right subdural, and minimal residual extra-axial fluid up to 3 mm thick in the right posterior temporal region (image 16, series 2).  Bilateral pneumocephalus.  Slight 3 mm left to right midline shift at the level of the septum pellucidum.  No new areas of intracranial hemorrhage, mass lesion, acute stroke, or hydrocephalus.  IMPRESSION: Overall improved status post left frontal and parietal burr holes for evacuation of mixed attenuation left subdural hematoma; residual hypodense fluid collection in the subdural space on the left measures up to 10 mm thickness.  Continued improvement right subdural fluid collection with indwelling Jackson-Pratt drain.  Original Report Authenticated By: Elsie Stain, M.D.   Ct Head Wo Contrast  05/23/2011  *RADIOLOGY REPORT*  Clinical Data: Follow-up subdural hematoma post surgical decompression.  CT HEAD WITHOUT CONTRAST  Technique:  Contiguous axial images were obtained from the base of the skull through the vertex without contrast.  Comparison: 05/22/2011  Findings: Interval placement of a right posterior parietal cranial ostomy with subdural drainage catheter in place.  Interval placement of a right anterior frontal cranial ostomy.  There is interval development of subdural gas in the right frontal region, measuring about 12 mm depth.  The right subdural hematoma has been mostly decompressed.  There is interval increase of the left subdural  hematoma with developing acute material suggesting acute on chronic read bleeding.  The left subdural hematoma now measures up to about 14 mm depth, compared with 6 mm previously.  There is associated compression of the left hemispheric sulci with mild to left to right shift of midline shift of about the 5 mm.  No evidence of parenchymal or ventricular hemorrhage.  Gray-white matter junctions are distinct.  Basal cisterns are not effaced. Vascular calcifications.  Visualized paranasal sinuses are not opacified.  IMPRESSION: Postoperative changes on the right with interval evacuation of right subdural hematoma.  Interval development of moderate right frontal subdural gas collection, likely postoperative. Interval increase in the left subdural hematoma with increased density suggesting read bleed.  Effacement of left cerebral sulci and 5 mm left to right midline shift.  Results were telephoned to the floor at the time of dictation, 0400 hours on 05/23/2011.  I spoke with Judeth Cornfield, RN.  Original Report Authenticated By: Marlon Pel, M.D.   Mr Laqueta Jean Wo Contrast  06/16/2011  *RADIOLOGY REPORT*  Clinical Data: Evaluate for dural AV fistula or intracranial AVM as a cause a recurrent subdural hemorrhage  MRI HEAD WITHOUT AND WITH CONTRAST  Technique:  Multiplanar, multiecho pulse sequences of the brain and surrounding structures were obtained according to standard protocol without and with intravenous contrast  Contrast: 15mL MULTIHANCE GADOBENATE DIMEGLUMINE 529 MG/ML IV SOLN  Comparison: Original CT scan 05/22/2011.  Most recent CT scan 06/15/2011  Findings: No acute stroke, acute hemorrhage, or hydrocephalus. Complex left greater than right extra-axial fluid collections, representing postoperative subdural fluid.  Gradient sequences show  moderate T2 shortening in the left posterior frontal subdural space indicating recent acute hemorrhage. Prior left and right burr holes noted.  Continued mass effect left to  right of 6 mm, with 9 mm mixed intensity extra-axial fluid collection on the left flattening the frontal lobe and resulting in cingulate gyrus displacement across the midline. Insignificant residual subdural fluid on the right.  Post infusion imaging does not reveal abnormal vessels or dural AV fistulae, but there is moderate bilateral dural enhancement, thicker on the left measuring up to 4 mm, which could serve as a source of recurrent hemorrhage.  IMPRESSION: No visible pial AVM or dural AV fistula.  Significant residual left subdural mixed signal intensity hematoma and hygroma measuring up to 9 mm, with 6 mm of left to right shift and moderate 4 mm dural thickening on the left.  Insignificant residual right subdural fluid collection.  Original Report Authenticated By: Elsie Stain, M.D.    Antibiotics:  Anti-infectives     Start     Dose/Rate Route Frequency Ordered Stop   06/15/11 0600   ceFAZolin (ANCEF) IVPB 1 g/50 mL premix        1 g 100 mL/hr over 30 Minutes Intravenous 3 times per day 06/14/11 2343 06/17/11 1339   06/14/11 2219   bacitracin 50,000 Units in sodium chloride irrigation 0.9 % 500 mL irrigation  Status:  Discontinued          As needed 06/14/11 2220 06/14/11 2324   06/14/11 2207   bacitracin 40981 UNITS injection     Comments: AYDELETTE, JAMIE: cabinet override         06/14/11 2207 06/15/11 1014   06/14/11 2206   ceFAZolin (ANCEF) 1-5 GM-% IVPB     Comments: AYDELETTE, JAMIE: cabinet override         06/14/11 2206 06/14/11 2157          Discharge Exam: Blood pressure 112/71, pulse 77, temperature 98 F (36.7 C), temperature source Oral, resp. rate 18, height 5\' 10"  (1.778 m), weight 74.2 kg (163 lb 9.3 oz), SpO2 96.00%. Neurologic: Grossly normal Incision CDI  Discharge Medications:   Medication List  As of 06/21/2011  3:26 PM   TAKE these medications         acetaminophen 325 MG tablet   Commonly known as: TYLENOL   Take 325 mg by mouth every 6 (six)  hours as needed. For pain      albuterol 108 (90 BASE) MCG/ACT inhaler   Commonly known as: PROVENTIL HFA;VENTOLIN HFA   Inhale 2 puffs into the lungs every 6 (six) hours as needed. For shortness of breath      ALPRAZolam 0.25 MG tablet   Commonly known as: XANAX   Take 1 tablet (0.25 mg total) by mouth 3 (three) times daily as needed for anxiety.      clobetasol 0.05 % Gel   Commonly known as: TEMOVATE   Apply 1 application topically daily as needed. For face.      fexofenadine 180 MG tablet   Commonly known as: ALLEGRA   Take 180 mg by mouth daily as needed. For allergies      glipiZIDE 5 MG 24 hr tablet   Commonly known as: GLUCOTROL XL   Take 5 mg by mouth daily.      HYDROcodone-acetaminophen 5-325 MG per tablet   Commonly known as: NORCO   Take 1 tablet by mouth every 4 (four) hours as needed.      levothyroxine 75  MCG tablet   Commonly known as: SYNTHROID, LEVOTHROID   Take 75 mcg by mouth daily.      losartan 50 MG tablet   Commonly known as: COZAAR   Take 50 mg by mouth daily.      multivitamin with minerals Tabs   Take 1 tablet by mouth daily.      pioglitazone 30 MG tablet   Commonly known as: ACTOS   Take 30 mg by mouth daily.      rosuvastatin 5 MG tablet   Commonly known as: CRESTOR   Take 5 mg by mouth daily.      sitaGLIPtan-metformin 50-1000 MG per tablet   Commonly known as: JANUMET   Take 1 tablet by mouth 2 (two) times daily with a meal.            Disposition: home   Final Dx: SDH  Discharge Orders    Future Orders Please Complete By Expires   Diet - low sodium heart healthy      Increase activity slowly      Call MD for:  temperature >100.4      Call MD for:  persistant nausea and vomiting      Call MD for:  severe uncontrolled pain      Call MD for:  redness, tenderness, or signs of infection (pain, swelling, redness, odor or green/yellow discharge around incision site)      Call MD for:  difficulty breathing, headache or visual  disturbances      Call MD for:  persistant dizziness or light-headedness         Follow-up Information    Follow up with CRAM,GARY P, MD. Schedule an appointment as soon as possible for a visit in 6 days.   Contact information:   [address], [address] [place] [address] [phone]           Signed: Tia Alert 06/21/2011, 3:26 PM

## 2011-06-21 NOTE — ED Notes (Signed)
Pt discharged today from [facility] post craniotomy from subdural bleed; pt sts went home and took nap and woke up with slurred speech; pt family sts called [phone] and they paged neuro sx on call and sts for pt to come to ED for eval; pt alert at present answering questions; pt with craniotomy scar intact at present

## 2011-06-21 NOTE — ED Notes (Signed)
DR. Danielle Dess PAGED BY SECRETARY TO UPDATE ON PT.'S CONDITION .

## 2011-06-22 ENCOUNTER — Other Ambulatory Visit: Payer: Self-pay | Admitting: Neurosurgery

## 2011-06-22 DIAGNOSIS — I6200 Nontraumatic subdural hemorrhage, unspecified: Secondary | ICD-10-CM

## 2011-06-23 ENCOUNTER — Encounter (HOSPITAL_COMMUNITY)
Admission: AD | Disposition: A | Discharge status: Home / Self Care | Payer: Self-pay | Source: Ambulatory Visit | Attending: Neurosurgery

## 2011-06-23 ENCOUNTER — Encounter (HOSPITAL_COMMUNITY): Payer: Self-pay | Admitting: Anesthesiology

## 2011-06-23 ENCOUNTER — Inpatient Hospital Stay (HOSPITAL_COMMUNITY)
Admission: AD | Admit: 2011-06-23 | Discharge: 2011-06-29 | Disposition: A | Discharge status: Home / Self Care | Payer: Medicare Other | Source: Ambulatory Visit | Attending: Neurosurgery | Admitting: Neurosurgery

## 2011-06-23 ENCOUNTER — Encounter (HOSPITAL_COMMUNITY): Payer: Self-pay | Admitting: *Deleted

## 2011-06-23 ENCOUNTER — Inpatient Hospital Stay (HOSPITAL_COMMUNITY): Payer: Medicare Other | Admitting: Anesthesiology

## 2011-06-23 ENCOUNTER — Inpatient Hospital Stay: Admit: 2011-06-23 | Payer: Self-pay | Admitting: Neurosurgery

## 2011-06-23 DIAGNOSIS — J45909 Unspecified asthma, uncomplicated: Secondary | ICD-10-CM | POA: Diagnosis present

## 2011-06-23 DIAGNOSIS — K5900 Constipation, unspecified: Secondary | ICD-10-CM | POA: Diagnosis present

## 2011-06-23 DIAGNOSIS — I6200 Nontraumatic subdural hemorrhage, unspecified: Secondary | ICD-10-CM | POA: Diagnosis not present

## 2011-06-23 DIAGNOSIS — I10 Essential (primary) hypertension: Secondary | ICD-10-CM | POA: Diagnosis present

## 2011-06-23 DIAGNOSIS — E119 Type 2 diabetes mellitus without complications: Secondary | ICD-10-CM | POA: Diagnosis present

## 2011-06-23 HISTORY — PX: CRANIOTOMY: SHX93

## 2011-06-23 LAB — CBC
HCT: 30.7 % — ABNORMAL LOW (ref 39.0–52.0)
Hemoglobin: 10 g/dL — ABNORMAL LOW (ref 13.0–17.0)
MCH: 28.8 pg (ref 26.0–34.0)
MCHC: 32.6 g/dL (ref 30.0–36.0)
MCV: 88.5 fL (ref 78.0–100.0)
Platelets: 235 10*3/uL (ref 150–400)
RBC: 3.47 MIL/uL — ABNORMAL LOW (ref 4.22–5.81)
RDW: 14.6 % (ref 11.5–15.5)
WBC: 6 10*3/uL (ref 4.0–10.5)

## 2011-06-23 LAB — COMPREHENSIVE METABOLIC PANEL
ALT: 27 U/L (ref 0–53)
AST: 26 U/L (ref 0–37)
Albumin: 3.4 g/dL — ABNORMAL LOW (ref 3.5–5.2)
Alkaline Phosphatase: 83 U/L (ref 39–117)
BUN: 21 mg/dL (ref 6–23)
CO2: 24 mEq/L (ref 19–32)
Calcium: 9.1 mg/dL (ref 8.4–10.5)
Chloride: 98 mEq/L (ref 96–112)
Creatinine, Ser: 1.29 mg/dL (ref 0.50–1.35)
GFR calc Af Amer: 62 mL/min — ABNORMAL LOW (ref >90)
GFR calc non Af Amer: 53 mL/min — ABNORMAL LOW (ref >90)
Glucose, Bld: 118 mg/dL — ABNORMAL HIGH (ref 70–99)
Potassium: 4.2 mEq/L (ref 3.5–5.1)
Sodium: 133 mEq/L — ABNORMAL LOW (ref 135–145)
Total Bilirubin: 0.3 mg/dL (ref 0.3–1.2)
Total Protein: 6.8 g/dL (ref 6.0–8.3)

## 2011-06-23 LAB — PROTIME-INR
INR: 1.03 (ref 0.00–1.49)
Prothrombin Time: 13.7 seconds (ref 11.6–15.2)

## 2011-06-23 LAB — GLUCOSE, CAPILLARY
Glucose-Capillary: 122 mg/dL — ABNORMAL HIGH (ref 70–99)
Glucose-Capillary: 83 mg/dL (ref 70–99)

## 2011-06-23 SURGERY — CRANIOTOMY HEMATOMA EVACUATION SUBDURAL
Anesthesia: General | Site: Head | Laterality: Left | Wound class: Clean

## 2011-06-23 MED ORDER — LACTATED RINGERS IV SOLN
INTRAVENOUS | Status: DC | PRN
  Administered 2011-06-23: 19:00:00 via INTRAVENOUS

## 2011-06-23 MED ORDER — PANTOPRAZOLE SODIUM 40 MG IV SOLR
40.0000 mg | Freq: Every day | INTRAVENOUS | Status: DC
  Filled 2011-06-23: qty 40

## 2011-06-23 MED ORDER — HYDROCODONE-ACETAMINOPHEN 5-325 MG PO TABS
1.0000 | ORAL_TABLET | ORAL | Status: DC | PRN
  Administered 2011-06-24 – 2011-06-28 (×7): 1 via ORAL
  Filled 2011-06-23 (×7): qty 1

## 2011-06-23 MED ORDER — DOCUSATE SODIUM 100 MG PO CAPS
100.0000 mg | ORAL_CAPSULE | Freq: Two times a day (BID) | ORAL | Status: DC
  Administered 2011-06-23 – 2011-06-28 (×8): 100 mg via ORAL
  Filled 2011-06-23 (×12): qty 1

## 2011-06-23 MED ORDER — PANTOPRAZOLE SODIUM 40 MG IV SOLR
40.0000 mg | Freq: Two times a day (BID) | INTRAVENOUS | Status: DC
  Administered 2011-06-23 – 2011-06-24 (×3): 40 mg via INTRAVENOUS
  Filled 2011-06-23 (×5): qty 40

## 2011-06-23 MED ORDER — GLYCOPYRROLATE 0.2 MG/ML IJ SOLN
INTRAMUSCULAR | Status: DC | PRN
  Administered 2011-06-23: 0.4 mg via INTRAVENOUS

## 2011-06-23 MED ORDER — ALPRAZOLAM 0.25 MG PO TABS
0.2500 mg | ORAL_TABLET | Freq: Three times a day (TID) | ORAL | Status: DC | PRN
  Administered 2011-06-25 – 2011-06-26 (×4): 0.25 mg via ORAL
  Filled 2011-06-23 (×5): qty 1

## 2011-06-23 MED ORDER — NEOSTIGMINE METHYLSULFATE 1 MG/ML IJ SOLN
INTRAMUSCULAR | Status: DC | PRN
  Administered 2011-06-23: 2 mg via INTRAVENOUS

## 2011-06-23 MED ORDER — LEVOTHYROXINE SODIUM 50 MCG PO TABS
50.0000 ug | ORAL_TABLET | Freq: Every day | ORAL | Status: DC
  Administered 2011-06-24 – 2011-06-29 (×6): 50 ug via ORAL
  Filled 2011-06-23 (×7): qty 1

## 2011-06-23 MED ORDER — PHENYLEPHRINE HCL 10 MG/ML IJ SOLN
10.0000 mg | INTRAVENOUS | Status: DC | PRN
  Administered 2011-06-23: 40 ug/min via INTRAVENOUS

## 2011-06-23 MED ORDER — SODIUM CHLORIDE 0.9 % IV SOLN
INTRAVENOUS | Status: AC
  Filled 2011-06-23: qty 500

## 2011-06-23 MED ORDER — CEFAZOLIN SODIUM 1-5 GM-% IV SOLN
INTRAVENOUS | Status: AC
  Filled 2011-06-23: qty 50

## 2011-06-23 MED ORDER — ONDANSETRON HCL 4 MG/2ML IJ SOLN
INTRAMUSCULAR | Status: DC | PRN
  Administered 2011-06-23: 4 mg via INTRAVENOUS

## 2011-06-23 MED ORDER — THROMBIN 20000 UNITS EX KIT
PACK | CUTANEOUS | Status: DC | PRN
  Administered 2011-06-23: 19:00:00 via TOPICAL

## 2011-06-23 MED ORDER — HYDROMORPHONE HCL PF 1 MG/ML IJ SOLN
INTRAMUSCULAR | Status: AC
  Administered 2011-06-23: 0.5 mg via INTRAVENOUS
  Filled 2011-06-23: qty 1

## 2011-06-23 MED ORDER — ONDANSETRON HCL 4 MG/2ML IJ SOLN: 4.0000 mg | Freq: Once | INTRAMUSCULAR | Status: AC | PRN

## 2011-06-23 MED ORDER — GLIPIZIDE ER 5 MG PO TB24
5.0000 mg | ORAL_TABLET | Freq: Every day | ORAL | Status: DC
  Administered 2011-06-24 – 2011-06-29 (×6): 5 mg via ORAL
  Filled 2011-06-23 (×6): qty 1

## 2011-06-23 MED ORDER — SODIUM CHLORIDE 0.9 % IV SOLN
INTRAVENOUS | Status: DC | PRN
  Administered 2011-06-23 (×2): via INTRAVENOUS

## 2011-06-23 MED ORDER — METFORMIN HCL 500 MG PO TABS
1000.0000 mg | ORAL_TABLET | Freq: Two times a day (BID) | ORAL | Status: DC
  Administered 2011-06-24 – 2011-06-27 (×7): 1000 mg via ORAL
  Filled 2011-06-23 (×10): qty 2

## 2011-06-23 MED ORDER — CEFAZOLIN SODIUM 1-5 GM-% IV SOLN
INTRAVENOUS | Status: DC | PRN
  Administered 2011-06-23: 1 g via INTRAVENOUS

## 2011-06-23 MED ORDER — DEXAMETHASONE SODIUM PHOSPHATE 4 MG/ML IJ SOLN
8.0000 mg | Freq: Four times a day (QID) | INTRAMUSCULAR | Status: DC
  Administered 2011-06-23 – 2011-06-25 (×6): 8 mg via INTRAVENOUS
  Filled 2011-06-23 (×12): qty 2

## 2011-06-23 MED ORDER — ATORVASTATIN CALCIUM 10 MG PO TABS
10.0000 mg | ORAL_TABLET | Freq: Every day | ORAL | Status: DC
  Administered 2011-06-24 – 2011-06-29 (×5): 10 mg via ORAL
  Filled 2011-06-23 (×7): qty 1

## 2011-06-23 MED ORDER — ACETAMINOPHEN 325 MG PO TABS: 650.0000 mg | ORAL_TABLET | ORAL | Status: DC | PRN

## 2011-06-23 MED ORDER — SITAGLIPTIN PHOS-METFORMIN HCL 50-1000 MG PO TABS: 1.0000 | ORAL_TABLET | Freq: Two times a day (BID) | ORAL | Status: DC

## 2011-06-23 MED ORDER — LINAGLIPTIN 5 MG PO TABS
5.0000 mg | ORAL_TABLET | Freq: Two times a day (BID) | ORAL | Status: DC
  Administered 2011-06-24 – 2011-06-27 (×7): 5 mg via ORAL
  Filled 2011-06-23 (×10): qty 1

## 2011-06-23 MED ORDER — ONDANSETRON HCL 4 MG/2ML IJ SOLN
4.0000 mg | Freq: Four times a day (QID) | INTRAMUSCULAR | Status: DC | PRN
  Administered 2011-06-26: 4 mg via INTRAVENOUS
  Filled 2011-06-23: qty 2

## 2011-06-23 MED ORDER — LOSARTAN POTASSIUM 50 MG PO TABS
50.0000 mg | ORAL_TABLET | Freq: Every day | ORAL | Status: DC
  Administered 2011-06-24 – 2011-06-28 (×5): 50 mg via ORAL
  Filled 2011-06-23 (×7): qty 1

## 2011-06-23 MED ORDER — PROPOFOL 10 MG/ML IV BOLUS
INTRAVENOUS | Status: DC | PRN
  Administered 2011-06-23: 200 mg via INTRAVENOUS

## 2011-06-23 MED ORDER — FENTANYL CITRATE 0.05 MG/ML IJ SOLN
INTRAMUSCULAR | Status: DC | PRN
  Administered 2011-06-23: 100 ug via INTRAVENOUS
  Administered 2011-06-23: 150 ug via INTRAVENOUS

## 2011-06-23 MED ORDER — LEVETIRACETAM 500 MG PO TABS
500.0000 mg | ORAL_TABLET | Freq: Two times a day (BID) | ORAL | Status: DC
  Administered 2011-06-23 – 2011-06-24 (×3): 500 mg via ORAL
  Filled 2011-06-23 (×5): qty 1

## 2011-06-23 MED ORDER — HYPROMELLOSE (GONIOSCOPIC) 2.5 % OP SOLN
1.0000 [drp] | OPHTHALMIC | Status: DC
  Administered 2011-06-26 – 2011-06-28 (×2): 1 [drp] via OPHTHALMIC
  Filled 2011-06-23: qty 15

## 2011-06-23 MED ORDER — ADULT MULTIVITAMIN W/MINERALS CH
1.0000 | ORAL_TABLET | Freq: Every day | ORAL | Status: DC
  Administered 2011-06-24 – 2011-06-29 (×6): 1 via ORAL
  Filled 2011-06-23 (×7): qty 1

## 2011-06-23 MED ORDER — HYDROMORPHONE HCL PF 1 MG/ML IJ SOLN
0.2500 mg | INTRAMUSCULAR | Status: DC | PRN
  Administered 2011-06-23 (×4): 0.5 mg via INTRAVENOUS

## 2011-06-23 MED ORDER — HYDROMORPHONE HCL PF 1 MG/ML IJ SOLN
0.5000 mg | INTRAMUSCULAR | Status: DC | PRN
  Administered 2011-06-23 (×3): 0.5 mg via INTRAVENOUS

## 2011-06-23 MED ORDER — SODIUM CHLORIDE 0.9 % IR SOLN
Status: DC | PRN
  Administered 2011-06-23: 19:00:00

## 2011-06-23 MED ORDER — BACITRACIN 50000 UNITS IM SOLR
INTRAMUSCULAR | Status: AC
  Filled 2011-06-23: qty 1

## 2011-06-23 MED ORDER — ACETAMINOPHEN 325 MG PO TABS: 325.0000 mg | ORAL_TABLET | Freq: Four times a day (QID) | ORAL | Status: DC | PRN

## 2011-06-23 MED ORDER — SODIUM CHLORIDE 0.9 % IV SOLN
INTRAVENOUS | Status: DC
  Administered 2011-06-24 – 2011-06-26 (×4): via INTRAVENOUS

## 2011-06-23 MED ORDER — ROCURONIUM BROMIDE 100 MG/10ML IV SOLN
INTRAVENOUS | Status: DC | PRN
  Administered 2011-06-23: 30 mg via INTRAVENOUS

## 2011-06-23 MED ORDER — 0.9 % SODIUM CHLORIDE (POUR BTL) OPTIME
TOPICAL | Status: DC | PRN
  Administered 2011-06-23 (×2): 1000 mL

## 2011-06-23 MED ORDER — ACETAMINOPHEN 650 MG RE SUPP: 650.0000 mg | RECTAL | Status: DC | PRN

## 2011-06-23 MED ORDER — LORATADINE 10 MG PO TABS
10.0000 mg | ORAL_TABLET | Freq: Every day | ORAL | Status: DC
  Administered 2011-06-24 – 2011-06-29 (×6): 10 mg via ORAL
  Filled 2011-06-23 (×7): qty 1

## 2011-06-23 MED ORDER — CEFAZOLIN SODIUM 1-5 GM-% IV SOLN
1.0000 g | Freq: Three times a day (TID) | INTRAVENOUS | Status: DC
  Administered 2011-06-24 – 2011-06-29 (×16): 1 g via INTRAVENOUS
  Filled 2011-06-23 (×19): qty 50

## 2011-06-23 MED ORDER — CLOBETASOL PROPIONATE 0.05 % EX GEL: 1.0000 "application " | Freq: Every day | CUTANEOUS | Status: DC | PRN

## 2011-06-23 MED ORDER — ESMOLOL HCL 10 MG/ML IV SOLN
INTRAVENOUS | Status: DC | PRN
  Administered 2011-06-23: 30 mg via INTRAVENOUS
  Administered 2011-06-23 (×2): 20 mg via INTRAVENOUS

## 2011-06-23 MED ORDER — HEMOSTATIC AGENTS (NO CHARGE) OPTIME
TOPICAL | Status: DC | PRN
  Administered 2011-06-23: 1 via TOPICAL

## 2011-06-23 MED ORDER — PIOGLITAZONE HCL 30 MG PO TABS
30.0000 mg | ORAL_TABLET | Freq: Every day | ORAL | Status: DC
  Administered 2011-06-24 – 2011-06-29 (×6): 30 mg via ORAL
  Filled 2011-06-23 (×7): qty 1

## 2011-06-23 MED ORDER — LABETALOL HCL 5 MG/ML IV SOLN
10.0000 mg | INTRAVENOUS | Status: DC | PRN
  Administered 2011-06-26 – 2011-06-27 (×3): 20 mg via INTRAVENOUS
  Filled 2011-06-23 (×4): qty 4

## 2011-06-23 MED ORDER — SENNOSIDES-DOCUSATE SODIUM 8.6-50 MG PO TABS
1.0000 | ORAL_TABLET | Freq: Two times a day (BID) | ORAL | Status: DC
  Administered 2011-06-23 – 2011-06-28 (×8): 1 via ORAL
  Filled 2011-06-23 (×11): qty 1

## 2011-06-23 MED ORDER — ALBUTEROL SULFATE HFA 108 (90 BASE) MCG/ACT IN AERS: 2.0000 | INHALATION_SPRAY | Freq: Four times a day (QID) | RESPIRATORY_TRACT | Status: DC | PRN

## 2011-06-23 SURGICAL SUPPLY — 79 items
ADH SKN CLS APL DERMABOND .7 (GAUZE/BANDAGES/DRESSINGS)
BAG DECANTER FOR FLEXI CONT (MISCELLANEOUS) ×2 IMPLANT
BANDAGE GAUZE 4  KLING STR (GAUZE/BANDAGES/DRESSINGS) IMPLANT
BIT DRILL WIRE PASS 1.3MM (BIT) ×1 IMPLANT
BLADE SURG 11 STRL SS (BLADE) ×1 IMPLANT
BLADE SURG ROTATE 9660 (MISCELLANEOUS) IMPLANT
BNDG COHESIVE 4X5 TAN NS LF (GAUZE/BANDAGES/DRESSINGS) IMPLANT
BRUSH SCRUB EZ PLAIN DRY (MISCELLANEOUS) ×2 IMPLANT
BUR ACORN 9.0 PRECISION (BURR) ×1 IMPLANT
BUR ROUTER D-58 CRANI (BURR) IMPLANT
CANISTER SUCTION 2500CC (MISCELLANEOUS) ×2 IMPLANT
CATH VENTRIC 35X38 W/TROCAR LG (CATHETERS) ×1 IMPLANT
CLIP TI MEDIUM 6 (CLIP) IMPLANT
CLOTH BEACON ORANGE TIMEOUT ST (SAFETY) ×2 IMPLANT
CONT SPEC 4OZ CLIKSEAL STRL BL (MISCELLANEOUS) ×2 IMPLANT
CORDS BIPOLAR (ELECTRODE) ×2 IMPLANT
COVER TABLE BACK 60X90 (DRAPES) ×2 IMPLANT
DECANTER SPIKE VIAL GLASS SM (MISCELLANEOUS) ×2 IMPLANT
DERMABOND ADVANCED (GAUZE/BANDAGES/DRESSINGS)
DERMABOND ADVANCED .7 DNX12 (GAUZE/BANDAGES/DRESSINGS) ×1 IMPLANT
DRAIN BAG CSF ACCUDRAIN (MISCELLANEOUS) ×1 IMPLANT
DRAPE NEUROLOGICAL W/INCISE (DRAPES) ×2 IMPLANT
DRAPE SURG 17X23 STRL (DRAPES) IMPLANT
DRAPE WARM FLUID 44X44 (DRAPE) ×2 IMPLANT
DRILL WIRE PASS 1.3MM (BIT) ×2
DRSG OPSITE 4X5.5 SM (GAUZE/BANDAGES/DRESSINGS) ×4 IMPLANT
ELECT CAUTERY BLADE 6.4 (BLADE) ×2 IMPLANT
ELECT REM PT RETURN 9FT ADLT (ELECTROSURGICAL) ×2
ELECTRODE REM PT RTRN 9FT ADLT (ELECTROSURGICAL) ×1 IMPLANT
EVACUATOR 1/8 PVC DRAIN (DRAIN) ×1 IMPLANT
EVACUATOR SILICONE 100CC (DRAIN) ×1 IMPLANT
GAUZE SPONGE 4X4 16PLY XRAY LF (GAUZE/BANDAGES/DRESSINGS) IMPLANT
GLOVE BIO SURGEON STRL SZ 6.5 (GLOVE) ×2 IMPLANT
GLOVE BIO SURGEON STRL SZ8 (GLOVE) ×2 IMPLANT
GLOVE BIOGEL PI IND STRL 6.5 (GLOVE) IMPLANT
GLOVE BIOGEL PI IND STRL 7.5 (GLOVE) IMPLANT
GLOVE BIOGEL PI INDICATOR 6.5 (GLOVE) ×1
GLOVE BIOGEL PI INDICATOR 7.5 (GLOVE) ×1
GLOVE ECLIPSE 7.5 STRL STRAW (GLOVE) ×1 IMPLANT
GLOVE EXAM NITRILE LRG STRL (GLOVE) IMPLANT
GLOVE EXAM NITRILE MD LF STRL (GLOVE) ×2 IMPLANT
GLOVE EXAM NITRILE XL STR (GLOVE) IMPLANT
GLOVE EXAM NITRILE XS STR PU (GLOVE) IMPLANT
GLOVE INDICATOR 8.5 STRL (GLOVE) ×2 IMPLANT
GOWN BRE IMP SLV AUR LG STRL (GOWN DISPOSABLE) ×2 IMPLANT
GOWN BRE IMP SLV AUR XL STRL (GOWN DISPOSABLE) ×2 IMPLANT
GOWN STRL REIN 2XL LVL4 (GOWN DISPOSABLE) ×2 IMPLANT
HEMOSTAT SURGICEL 2X14 (HEMOSTASIS) ×1 IMPLANT
HOOK DURA (MISCELLANEOUS) ×2 IMPLANT
KIT BASIN OR (CUSTOM PROCEDURE TRAY) ×2 IMPLANT
KIT ROOM TURNOVER OR (KITS) ×2 IMPLANT
NDL HYPO 25X1 1.5 SAFETY (NEEDLE) ×1 IMPLANT
NEEDLE HYPO 25X1 1.5 SAFETY (NEEDLE) ×2 IMPLANT
NS IRRIG 1000ML POUR BTL (IV SOLUTION) ×3 IMPLANT
PACK CRANIOTOMY (CUSTOM PROCEDURE TRAY) ×2 IMPLANT
PAD ARMBOARD 7.5X6 YLW CONV (MISCELLANEOUS) ×3 IMPLANT
PAD EYE OVAL STERILE LF (GAUZE/BANDAGES/DRESSINGS) IMPLANT
PATTIES SURGICAL .25X.25 (GAUZE/BANDAGES/DRESSINGS) IMPLANT
PATTIES SURGICAL .5 X.5 (GAUZE/BANDAGES/DRESSINGS) IMPLANT
PATTIES SURGICAL .5 X3 (DISPOSABLE) IMPLANT
PATTIES SURGICAL 1X1 (DISPOSABLE) IMPLANT
PIN MAYFIELD SKULL DISP (PIN) IMPLANT
SCREW SELF DRILL HT 1.5/4MM (Screw) ×4 IMPLANT
SPONGE GAUZE 4X4 12PLY (GAUZE/BANDAGES/DRESSINGS) ×2 IMPLANT
SPONGE NEURO XRAY DETECT 1X3 (DISPOSABLE) IMPLANT
SPONGE SURGIFOAM ABS GEL 100 (HEMOSTASIS) IMPLANT
STAPLER SKIN PROX WIDE 3.9 (STAPLE) IMPLANT
STAPLER VISISTAT 35W (STAPLE) ×2 IMPLANT
SUT ETHILON 3 0 FSL (SUTURE) ×1 IMPLANT
SUT ETHILON 3 0 PS 1 (SUTURE) ×1 IMPLANT
SUT NURALON 4 0 TR CR/8 (SUTURE) ×4 IMPLANT
SUT VIC AB 2-0 CT1 18 (SUTURE) ×3 IMPLANT
SYR 20ML ECCENTRIC (SYRINGE) ×2 IMPLANT
SYR CONTROL 10ML LL (SYRINGE) ×2 IMPLANT
TOWEL OR 17X24 6PK STRL BLUE (TOWEL DISPOSABLE) ×2 IMPLANT
TOWEL OR 17X26 10 PK STRL BLUE (TOWEL DISPOSABLE) ×2 IMPLANT
TRAY FOLEY CATH 14FRSI W/METER (CATHETERS) ×1 IMPLANT
UNDERPAD 30X30 INCONTINENT (UNDERPADS AND DIAPERS) IMPLANT
WATER STERILE IRR 1000ML POUR (IV SOLUTION) ×2 IMPLANT

## 2011-06-23 NOTE — Anesthesia Procedure Notes (Signed)
Procedure Name: Intubation Date/Time: 06/23/2011 6:52 PM Performed by: Alanda Amass A Pre-anesthesia Checklist: Patient identified, Timeout performed, Emergency Drugs available, Suction available and Patient being monitored Patient Re-evaluated:Patient Re-evaluated prior to inductionOxygen Delivery Method: Circle system utilized Preoxygenation: Pre-oxygenation with 100% oxygen Intubation Type: IV induction Ventilation: Mask ventilation without difficulty Laryngoscope Size: Mac and 3 Grade View: Grade I Tube type: Oral Tube size: 8.0 mm Number of attempts: 1 Airway Equipment and Method: Stylet Placement Confirmation: ETT inserted through vocal cords under direct vision,  positive ETCO2 and breath sounds checked- equal and bilateral Dental Injury: Teeth and Oropharynx as per pre-operative assessment

## 2011-06-23 NOTE — Transfer of Care (Signed)
Immediate Anesthesia Transfer of Care Note  Patient: James Juarez  Procedure(s) Performed: Procedure(s) (LRB): CRANIOTOMY HEMATOMA EVACUATION SUBDURAL (Left)  Patient Location: PACU  Anesthesia Type: General  Level of Consciousness: sedated  Airway & Oxygen Therapy: Patient Spontanous Breathing and Patient connected to nasal cannula oxygen  Post-op Assessment: Report given to PACU RN and Post -op Vital signs reviewed and stable  Post vital signs: Reviewed and stable  Complications: No apparent anesthesia complications

## 2011-06-23 NOTE — Op Note (Signed)
Preoperative diagnosis: Recurrent left frontal fluid collection either subdural hematoma or CSF loculations  Postoperative diagnosis: Same  Procedure: Redo craniotomy for reexploration of left frontal subdural fluid collection evacuation of left sub-dural hematoma.  Surgeon: Jillyn Hidden Nik Gorrell  Anesthesia: Gen.  EBL: Minimal  History of present illness: Patient is a 71 year old gentleman presents with some speech difficulty with speech arrest that's been going on for a couple days with not responding to oral Was consistent with possible seizure activity so patient brought back in the hospital was evaluated his followup CT showed a persistent left frontal subdural fluid collection either CSF or hygroma with some local mass effect. The patient and family were recommended a reexploration re\re evacuation placement of a subdural drain in the external ventricular catheter into the space and observation. Risks and benefits of the operation as well as perioperative course and expectations of outcome alternatives surgery will patient and family. And he agreed to proceed forward.  Operative procedure: Patient brought into the or was induced under general anesthesia positioned supine with a shoulder bump under his left shoulder his head turned the right his left side craniotomy site was opened up the suture removed the flap was removed there was a mild amount of acute blood on either side flap this is all aspirated once the dura was identified and the Gelfoam was laid over the dura some again very old motor oral appearing Tan colored fluid came out under pressure began after exposed and removed a little more when on other new very thin subdural membrane and explored the frontal area there was some loculation adhesions and set up the trapped this fluid left frontal he. These were all removed this membrane was again stripped away opening up frame freely communicating the space between the left frontal area the craniotomy  site and posteriorly. The scope was irrigated until clear irrigant was visualized some additional subdural membrane was removed and lysed the extra-articular catheter was in plaster through the posterior bur hole into the anterior left frontal space and the dura was reapproximated with watertight closure Gelfoam was laid up the dura the flap was reapproximated with 40 screws in about a plating system a J-P drain was placed in the floor and the galea was closed with after Vicryl and a running nylon. At the end of case all needle counts and sponge counts were correct. I know, occasions and drains to a subgaleal JP and a subdural extraocular Hooked up to collecting system.

## 2011-06-23 NOTE — Anesthesia Postprocedure Evaluation (Signed)
  Anesthesia Post-op Note  Patient: James Juarez  Procedure(s) Performed: Procedure(s) (LRB): CRANIOTOMY HEMATOMA EVACUATION SUBDURAL (Left)  Patient Location: PACU  Anesthesia Type: General  Level of Consciousness: awake, alert , oriented and patient cooperative  Airway and Oxygen Therapy: Patient Spontanous Breathing and Patient connected to nasal cannula oxygen  Post-op Pain: mild  Post-op Assessment: Post-op Vital signs reviewed, Patient's Cardiovascular Status Stable, Respiratory Function Stable, Patent Airway, No signs of Nausea or vomiting and Pain level controlled  Post-op Vital Signs: stable  Complications: No apparent anesthesia complications

## 2011-06-23 NOTE — H&P (Signed)
James Juarez is an 71 y.o. male.   Chief Complaint: Speech difficult HPI: Patient is 71 year old gentleman well-known to the service is the hospital for management of a subacute on chronic subdural hematoma. Patient was admitted as a month ago underwent burr hole coagulation of subdural patient presented back last week with a reexploration underwent craniotomy for Dr. subdural this patient did very well week however over last days had several episodes of difficulty with speech last about 5-7 minutes and spontaneously resolve patient was initiated on Keppra last evening with no significant benefit yet. He should have a callback again today concerned about these episodes be dressed I admitted the patient back in the ICU evaluation of his CT scan does show persistent loculated fluid collection left frontal lobe a routine basis his last craniotomy. Some local mass effect he appears to be the density of spinal fluid so I recommended opening of his incision taking off his flap and re\re wash this out and placing a ventricular catheter was consistent monitoring over several days. I extensively gone over the risks benefits of this procedure with the patient and family they understand and agree to proceed forward.  Past Medical History  Diagnosis Date  . Diabetes mellitus   . Hypertension   . Asthma   . Diverticular disease   . Subdural hematoma May 2013    bilateral     Past Surgical History  Procedure Date  . Hernia repair   . Burr hole 05/22/2011    Procedure: James Juarez;  Surgeon: James Dollar, MD;  Location: [facility];  Service: Neurosurgery;  Laterality: Right;  Right Burr Holes for evacuation of subdural hematoma  . Burr hole 05/23/2011    Procedure: James Juarez;  Surgeon: James Dollar, MD;  Location: [facility];  Service: Neurosurgery;  Laterality: Left;  Left [facility]  . Craniotomy 06/14/2011    Procedure: CRANIOTOMY HEMATOMA EVACUATION SUBDURAL;  Surgeon: James Dollar, MD;  Location: [facility];  Service: Neurosurgery;  Laterality: N/A;  Left Craniotomy for subdural hematoma    No family history on file. Social History:  reports that he has quit smoking. He quit smokeless tobacco use about 33 years ago. He reports that he does not drink alcohol or use illicit drugs.  Allergies: No Known Allergies  Medications Prior to Admission  Medication Sig Dispense Refill  . acetaminophen (TYLENOL) 325 MG tablet Take 325 mg by mouth every 6 (six) hours as needed. For pain      . albuterol (PROVENTIL HFA;VENTOLIN HFA) 108 (90 BASE) MCG/ACT inhaler Inhale 2 puffs into the lungs every 6 (six) hours as needed. For shortness of breath      . ALPRAZolam (XANAX) 0.25 MG tablet Take 1 tablet (0.25 mg total) by mouth 3 (three) times daily as needed for anxiety.  60 tablet  1  . clobetasol (TEMOVATE) 0.05 % GEL Apply 1 application topically daily as needed. For face.      . fexofenadine (ALLEGRA) 180 MG tablet Take 180 mg by mouth daily as needed. For allergies      . glipiZIDE (GLUCOTROL XL) 5 MG 24 hr tablet Take 5 mg by mouth daily.      [name] Kitchen HYDROcodone-acetaminophen (NORCO) 5-325 MG per tablet Take 1 tablet by mouth every 4 (four) hours as needed.  60 tablet  1  . losartan (COZAAR) 50 MG tablet Take 50 mg by mouth daily.      . Multiple Vitamin (MULITIVITAMIN WITH MINERALS)  TABS Take 1 tablet by mouth daily.      . pioglitazone (ACTOS) 30 MG tablet Take 30 mg by mouth daily.      . rosuvastatin (CRESTOR) 5 MG tablet Take 5 mg by mouth daily.      . sitaGLIPtan-metformin (JANUMET) 50-1000 MG per tablet Take 1 tablet by mouth 2 (two) times daily with a meal.        Results for orders placed during the hospital encounter of 06/21/11 (from the past 48 hour(s))  PROTIME-INR     Status: Normal   Collection Time   06/21/11  8:54 PM      Component Value Range Comment   Prothrombin Time 13.1  11.6 - 15.2 seconds    INR 0.97  0.00 - 1.49   CBC     Status: Abnormal   Collection Time   06/21/11  8:54 PM       Component Value Range Comment   WBC 6.3  4.0 - 10.5 K/uL    RBC 3.40 (*) 4.22 - 5.81 MIL/uL    Hemoglobin 9.8 (*) 13.0 - 17.0 g/dL    HCT 16.1 (*) 09.6 - 52.0 %    MCV 87.9  78.0 - 100.0 fL    MCH 28.8  26.0 - 34.0 pg    MCHC 32.8  30.0 - 36.0 g/dL    RDW 04.5  40.9 - 81.1 %    Platelets 228  150 - 400 K/uL   BASIC METABOLIC PANEL     Status: Abnormal   Collection Time   06/21/11  8:54 PM      Component Value Range Comment   Sodium 134 (*) 135 - 145 mEq/L    Potassium 4.2  3.5 - 5.1 mEq/L    Chloride 97  96 - 112 mEq/L    CO2 27  19 - 32 mEq/L    Glucose, Bld 93  70 - 99 mg/dL    BUN 18  6 - 23 mg/dL    Creatinine, Ser 9.14  0.50 - 1.35 mg/dL    Calcium 9.3  8.4 - 78.2 mg/dL    GFR calc non Af Amer 52 (*) >90 mL/min    GFR calc Af Amer 61 (*) >90 mL/min    Ct Head Wo Contrast  06/21/2011  *RADIOLOGY REPORT*  Clinical Data: Episode of aphasia.  CT HEAD WITHOUT CONTRAST  Technique:  Contiguous axial images were obtained from the base of the skull through the vertex without contrast.  Comparison: Head CT scan 06/21/2011 at the 5:46 a.m.  Findings: Left craniotomy defect is again seen.  Acute on chronic subdural hemorrhage over the left convexities is not markedly changed in appearance measuring 1.6 cm in thickness.  Left right midline shift of 0.8 cm is unchanged.  No new hemorrhage is identified.  No evidence of acute infarction is seen.  Burr holes on the right again seen.  IMPRESSION: Acute on chronic left subdural hemorrhage is unchanged in appearance with 0.8 cm of left right midline shift, also unchanged. No new abnormality.  Original Report Authenticated By: James Juarez. James Juarez, M.D.    Review of Systems  Constitutional: Negative.   HENT: Negative.   Eyes: Negative.   Respiratory: Negative.   Cardiovascular: Negative.   Gastrointestinal: Negative.   Genitourinary: Negative.   Musculoskeletal: Negative.   Skin: Negative.   Neurological: Positive for speech change.   Endo/Heme/Allergies: Negative.   Psychiatric/Behavioral: Negative.     There were no vitals taken for this visit.  Physical Exam  Constitutional: He is oriented to person, place, and time. He appears well-developed.  HENT:  Head: Normocephalic.  Eyes: Pupils are equal, round, and reactive to light.  Neck: Normal range of motion.  Cardiovascular: Normal rate.   Respiratory: Effort normal and breath sounds normal.  GI: Bowel sounds are normal.  Musculoskeletal: Normal range of motion.  Neurological: He is alert and oriented to person, place, and time. He has normal strength. GCS eye subscore is 4. GCS verbal subscore is 5. GCS motor subscore is 6.  Reflex Scores:      Patellar reflexes are 1+ on the right side and 1+ on the left side.      Achilles reflexes are 1+ on the right side and 1+ on the left side.      Patient is awake alert oriented strength is 5 out of 5 cranial nerves are intact he has no pronator drift he has intact naming and no evidence of dysphasia right now      Assessment/Plan 70 year old gentleman presents with attenuation of what's possibly a CSF fluid collection or subdural hygroma left frontal this either as a loculated fluid collection or could represent extra hydrocephalus. When placing an external ventricular drain after redo craniotomy to see if we can lyse some additional adhesions and loculations and get this fluid to drain his possible pain patient Maxon in a shunt.  Rabia Argote P 06/23/2011, 1:53 PM

## 2011-06-23 NOTE — Anesthesia Preprocedure Evaluation (Addendum)
Anesthesia Evaluation  Patient identified by MRN, date of birth, ID band  Reviewed: Allergy & Precautions, H&P , NPO status , Patient's Chart, lab work & pertinent test results  Airway Mallampati: II TM Distance: >3 FB Neck ROM: Full    Dental  (+) Missing   Pulmonary asthma ,          Cardiovascular hypertension, Pt. on medications Rhythm:regular Rate:Normal     Neuro/Psych    GI/Hepatic   Endo/Other  Diabetes mellitus-, Type 2, Oral Hypoglycemic Agents  Renal/GU      Musculoskeletal   Abdominal   Peds  Hematology   Anesthesia Other Findings Aphasic  Reproductive/Obstetrics                         Anesthesia Physical Anesthesia Plan  ASA: II  Anesthesia Plan: General   Post-op Pain Management:    Induction: Intravenous  Airway Management Planned: Oral ETT  Additional Equipment:   Intra-op Plan:   Post-operative Plan:   Informed Consent: I have reviewed the patients History and Physical, chart, labs and discussed the procedure including the risks, benefits and alternatives for the proposed anesthesia with the patient or authorized representative who has indicated his/her understanding and acceptance.     Plan Discussed with: CRNA, Anesthesiologist and Surgeon  Anesthesia Plan Comments:         Anesthesia Quick Evaluation

## 2011-06-24 LAB — GLUCOSE, CAPILLARY
Glucose-Capillary: 142 mg/dL — ABNORMAL HIGH (ref 70–99)
Glucose-Capillary: 158 mg/dL — ABNORMAL HIGH (ref 70–99)
Glucose-Capillary: 223 mg/dL — ABNORMAL HIGH (ref 70–99)

## 2011-06-24 NOTE — Progress Notes (Signed)
Subjective: Patient reports Well today there was no episodes of dysphagia last night very mild headache no numbness and tingling in his arms or his legs  Objective: Vital signs in last 24 hours: Temp:  [97 F (36.1 C)-98 F (36.7 C)] 97 F (36.1 C) (06/15 0400) Pulse Rate:  [62-92] 77  (06/15 0700) Resp:  [14-26] 21  (06/15 0700) BP: (98-132)/(50-84) 118/57 mmHg (06/15 0700) SpO2:  [92 %-100 %] 100 % (06/15 0700) Weight:  [72.9 kg (160 lb 11.5 oz)] 72.9 kg (160 lb 11.5 oz) (06/14 1338)  Intake/Output from previous day: 06/14 0701 - 06/15 0700 In: 2625 [I.V.:2575; IV Piggyback:50] Out: 1050 [Urine:975; Drains:25; Blood:50] Intake/Output this shift:    Alert and oriented no episodes of her apparent signs of dysphagia  Lab Results:  [facility] 06/23/11 1442 06/21/11 2054  WBC 6.0 6.3  HGB 10.0* 9.8*  HCT 30.7* 29.9*  PLT 235 228   BMET  Basename 06/23/11 1442 06/21/11 2054  NA 133* 134*  K 4.2 4.2  CL 98 97  CO2 24 27  GLUCOSE 118* 93  BUN 21 18  CREATININE 1.29 1.31  CALCIUM 9.1 9.3    Studies/Results: No results found.  Assessment/Plan: POD1  redo craniotomy for subdural drains functioning well continue observation in the ICU CT scan tomorrow  LOS: 1 day     Kamarii Carton P 06/24/2011, 7:31 AM

## 2011-06-24 NOTE — ED Provider Notes (Signed)
Medical screening examination/treatment/procedure(s) were performed by non-physician practitioner and as supervising physician I was immediately available for consultation/collaboration.    Nelia Shi, MD 06/24/11 [phone]

## 2011-06-25 ENCOUNTER — Inpatient Hospital Stay (HOSPITAL_COMMUNITY): Payer: Medicare Other

## 2011-06-25 LAB — GLUCOSE, CAPILLARY
Glucose-Capillary: 169 mg/dL — ABNORMAL HIGH (ref 70–99)
Glucose-Capillary: 172 mg/dL — ABNORMAL HIGH (ref 70–99)

## 2011-06-25 LAB — BASIC METABOLIC PANEL
BUN: 22 mg/dL (ref 6–23)
CO2: 20 mEq/L (ref 19–32)
Calcium: 8.6 mg/dL (ref 8.4–10.5)
Chloride: 99 mEq/L (ref 96–112)
Creatinine, Ser: 1.14 mg/dL (ref 0.50–1.35)
GFR calc Af Amer: 72 mL/min — ABNORMAL LOW (ref >90)
GFR calc non Af Amer: 62 mL/min — ABNORMAL LOW (ref >90)
Glucose, Bld: 290 mg/dL — ABNORMAL HIGH (ref 70–99)
Potassium: 4.8 mEq/L (ref 3.5–5.1)
Sodium: 131 mEq/L — ABNORMAL LOW (ref 135–145)

## 2011-06-25 MED ORDER — LEVETIRACETAM 750 MG PO TABS
750.0000 mg | ORAL_TABLET | Freq: Two times a day (BID) | ORAL | Status: DC
  Administered 2011-06-25 – 2011-06-29 (×9): 750 mg via ORAL
  Filled 2011-06-25 (×10): qty 1

## 2011-06-25 MED ORDER — DEXAMETHASONE SODIUM PHOSPHATE 4 MG/ML IJ SOLN
4.0000 mg | Freq: Four times a day (QID) | INTRAMUSCULAR | Status: DC
  Administered 2011-06-25 – 2011-06-29 (×16): 4 mg via INTRAVENOUS
  Filled 2011-06-25 (×19): qty 1

## 2011-06-25 MED ORDER — PANTOPRAZOLE SODIUM 40 MG PO TBEC
40.0000 mg | DELAYED_RELEASE_TABLET | Freq: Two times a day (BID) | ORAL | Status: DC
  Administered 2011-06-25 – 2011-06-29 (×10): 40 mg via ORAL
  Filled 2011-06-25 (×10): qty 1

## 2011-06-25 NOTE — Progress Notes (Signed)
PHARMACIST - PHYSICIAN COMMUNICATION  CONCERNING: Protonix IV to Oral Route Change Policy  RECOMMENDATION: This patient is receiving Protonix by the intravenous route.  Based on criteria approved by the Pharmacy and Therapeutics Committee, this is being converted to the equivalent oral dose form(s).   DESCRIPTION: These criteria include:  The patient is not neutropenic and does not exhibit a GI malabsorption state. Did not have GIB this admit.  The patient is eating (either orally or via tube) and/or has been taking other orally administered medications for a least 24 hours  If you have questions about this conversion, please contact the Pharmacy Department  Helvi Royals K. Allena Katz, PharmD, BCPS.  Clinical Pharmacist Pager [phone]. 06/25/2011 9:38 AM

## 2011-06-25 NOTE — Progress Notes (Signed)
Subjective: Patient reports Had a couple episodes of speech arrest last night but otherwise he seems to be doing fairly well with minimal headache no nausea or vomiting  Objective: Vital signs in last 24 hours: Temp:  [97.5 F (36.4 C)-98.3 F (36.8 C)] 97.5 F (36.4 C) (06/16 0400) Pulse Rate:  [45-75] 45  (06/16 0600) Resp:  [15-32] 19  (06/16 0600) BP: (93-127)/(38-65) 103/52 mmHg (06/16 0600) SpO2:  [94 %-100 %] 97 % (06/16 0600)  Intake/Output from previous day: 06/15 0701 - 06/16 0700 In: 1886.3 [I.V.:1726.3; IV Piggyback:160] Out: 1744 [Urine:1565; Drains:179] Intake/Output this shift:    Awake alert oriented strength 5 out of 5 drains using functioning well averaging 10-15 cc an hour  Lab Results:  Basename 06/23/11 1442  WBC 6.0  HGB 10.0*  HCT 30.7*  PLT 235   BMET  Basename 06/23/11 1442  NA 133*  K 4.2  CL 98  CO2 24  GLUCOSE 118*  BUN 21  CREATININE 1.29  CALCIUM 9.1    Studies/Results: Ct Head Wo Contrast  06/25/2011  *RADIOLOGY REPORT*  Clinical Data: Followup intracranial hemorrhage.  CT HEAD WITHOUT CONTRAST  Technique:  Contiguous axial images were obtained from the base of the skull through the vertex without contrast.  Comparison: 06/21/2011.  Findings: Recent left frontal craniotomy with drainage of left- sided subdural collection and drain remaining in place.  Decrease in size of the complex left sided subdural collection largest posterior superior convexity.  Accurate measurement of this larger component is difficult. Easily measured is the decrease in size of the anterior left frontal component now measuring 8 mm versus prior 15.9 mm.  Decrease in degree of midline shift to the right now 3.3 mm versus prior 8.3 mm.  Prior right frontal burr hole procedure.  No right-sided subdural collection detected.  White matter changes left periatrial region unchanged.  No CT evidence of large acute thrombotic infarct. No intracranial mass lesion detected on  this unenhanced exam.  Decrease in degree of pneumocephalus/gas below the left craniotomy site.  IMPRESSION: Decrease in size of left-sided subdural hematoma and midline shift to the right.  Original Report Authenticated By: Fuller Canada, M.D.    Assessment/Plan: Postop day 2 from redo craniotomy for evacuation of subdural placement of a subdural drain drains functioning well followup CAT scan shows significant evolution and decrease of subdural fluid as well as pneumocephalus vision is having some episodes of low heart rate we will check his electrolytes this morning is otherwise asymptomatic  LOS: 2 days     Seniyah Esker P 06/25/2011, 8:30 AM

## 2011-06-26 ENCOUNTER — Encounter (HOSPITAL_COMMUNITY): Payer: Self-pay | Admitting: Neurosurgery

## 2011-06-26 LAB — GLUCOSE, CAPILLARY
Glucose-Capillary: 67 mg/dL — ABNORMAL LOW (ref 70–99)
Glucose-Capillary: 89 mg/dL (ref 70–99)

## 2011-06-26 NOTE — Progress Notes (Signed)
Patient was complaining of constipation. Patient went for walk and the symptom was relieved. When he returned to his room the symptom came back. His blood pressure is affected by his constipation symptom. Last blood pressure at 1500 was 183/75. Labetalol was given at 1200. Need MD consult when available

## 2011-06-26 NOTE — Progress Notes (Signed)
Subjective: Patient reports Is feeling great had no episodes of speech arrest I. as no headache his drain day commended that last night but the output was minimal yesterday  Objective: Vital signs in last 24 hours: Temp:  [97.8 F (36.6 C)-98.8 F (37.1 C)] 98 F (36.7 C) (06/17 0400) Pulse Rate:  [42-55] 51  (06/17 0900) Resp:  [15-44] 21  (06/17 0900) BP: (80-151)/(29-96) 126/79 mmHg (06/17 0800) SpO2:  [92 %-100 %] 97 % (06/17 0900) Weight:  [76.9 kg (169 lb 8.5 oz)] 76.9 kg (169 lb 8.5 oz) (06/17 0200)  Intake/Output from previous day: 06/16 0701 - 06/17 0700 In: 2190 [P.O.:240; I.V.:1800; IV Piggyback:150] Out: 1679 [Urine:1650; Drains:29] Intake/Output this shift: Total I/O In: 390 [P.O.:240; I.V.:150] Out: 200 [Urine:200]  Awake alert oriented strength out of 5 no pronator drift  Lab Results:  Basename 06/23/11 1442  WBC 6.0  HGB 10.0*  HCT 30.7*  PLT 235   BMET  Basename 06/25/11 0948 06/23/11 1442  NA 131* 133*  K 4.8 4.2  CL 99 98  CO2 20 24  GLUCOSE 290* 118*  BUN 22 21  CREATININE 1.14 1.29  CALCIUM 8.6 9.1    Studies/Results: Ct Head Wo Contrast  06/25/2011  *RADIOLOGY REPORT*  Clinical Data: Followup intracranial hemorrhage.  CT HEAD WITHOUT CONTRAST  Technique:  Contiguous axial images were obtained from the base of the skull through the vertex without contrast.  Comparison: 06/21/2011.  Findings: Recent left frontal craniotomy with drainage of left- sided subdural collection and drain remaining in place.  Decrease in size of the complex left sided subdural collection largest posterior superior convexity.  Accurate measurement of this larger component is difficult. Easily measured is the decrease in size of the anterior left frontal component now measuring 8 mm versus prior 15.9 mm.  Decrease in degree of midline shift to the right now 3.3 mm versus prior 8.3 mm.  Prior right frontal burr hole procedure.  No right-sided subdural collection detected.   White matter changes left periatrial region unchanged.  No CT evidence of large acute thrombotic infarct. No intracranial mass lesion detected on this unenhanced exam.  Decrease in degree of pneumocephalus/gas below the left craniotomy site.  IMPRESSION: Decrease in size of left-sided subdural hematoma and midline shift to the right.  Original Report Authenticated By: Fuller Canada, M.D.    Assessment/Plan: Postop day 3 cranial me for redo subdural range external subdural drain came out site looks good continue observation unit head CT in the morning  LOS: 3 days     Nattaly Yebra P 06/26/2011, 9:34 AM

## 2011-06-26 NOTE — Progress Notes (Signed)
Inpatient Diabetes Program Recommendations  AACE/ADA: New Consensus Statement on Inpatient Glycemic Control (2009)  Target Ranges:  Prepandial:   less than 140 mg/dL      Peak postprandial:   less than 180 mg/dL (1-2 hours)      Critically ill patients:  140 - 180 mg/dL    Patient with history of Type 2 diabetes.  Takes oral medications at home. Only 1 CBG documented yesterday (06/25/11)  Inpatient Diabetes Program Recommendations Correction (SSI): Please check CBGs tid ac + HS and add Novolog Sensitive SSI if CBGs are elevated.  Note: Will follow. Ambrose Finland RN, MSN, CDE Diabetes Coordinator Inpatient Diabetes Program [phone]

## 2011-06-26 NOTE — Progress Notes (Signed)
Cr. Wynetta Emery notified that pt's subdural drain was noted to be out, lying beside pt, with small amount dark dried drainage at tip.  Pt w/ GCS 15, no speech or other deficits as noted, denies headache.  Drainage was 19 cc total for 06/25/11, last drainage was 3 cc 6/16 @ 23:00.  J-P drain is intact, charged, w/ small amy serosanguinous drainage.  Per Dr. Wynetta Emery, plan to leave drain out & monitor pt. closely.

## 2011-06-26 NOTE — Progress Notes (Signed)
Ventricular drainage catheter was found on the floor this morning. Patient was unaware that the catheter pulled out. I notified Dr Wynetta Emery. He suggested to add new dressing to the incision site if needed.

## 2011-06-27 ENCOUNTER — Encounter (HOSPITAL_COMMUNITY): Payer: Self-pay | Admitting: Radiology

## 2011-06-27 ENCOUNTER — Other Ambulatory Visit: Payer: Medicare Other

## 2011-06-27 ENCOUNTER — Inpatient Hospital Stay (HOSPITAL_COMMUNITY): Payer: Medicare Other

## 2011-06-27 MED ORDER — LINAGLIPTIN 5 MG PO TABS
5.0000 mg | ORAL_TABLET | Freq: Every day | ORAL | Status: DC
  Administered 2011-06-28 – 2011-06-29 (×2): 5 mg via ORAL
  Filled 2011-06-27 (×3): qty 1

## 2011-06-27 MED ORDER — METFORMIN HCL 500 MG PO TABS
1000.0000 mg | ORAL_TABLET | Freq: Two times a day (BID) | ORAL | Status: DC
  Administered 2011-06-28 – 2011-06-29 (×4): 1000 mg via ORAL
  Filled 2011-06-27 (×6): qty 2

## 2011-06-27 MED ORDER — CLONIDINE HCL 0.1 MG PO TABS
0.1000 mg | ORAL_TABLET | Freq: Three times a day (TID) | ORAL | Status: DC
  Administered 2011-06-27 – 2011-06-28 (×2): 0.1 mg via ORAL
  Filled 2011-06-27 (×8): qty 1

## 2011-06-27 MED ORDER — DOUBLE ANTIBIOTIC 500-10000 UNIT/GM EX OINT
TOPICAL_OINTMENT | Freq: Two times a day (BID) | CUTANEOUS | Status: DC
  Administered 2011-06-27 – 2011-06-29 (×5): via TOPICAL
  Filled 2011-06-27: qty 14.17

## 2011-06-27 MED ORDER — ZOLPIDEM TARTRATE 10 MG PO TABS
10.0000 mg | ORAL_TABLET | Freq: Every evening | ORAL | Status: DC | PRN
  Administered 2011-06-27 – 2011-06-28 (×2): 10 mg via ORAL
  Filled 2011-06-27 (×2): qty 1

## 2011-06-27 MED ORDER — DOUBLE ANTIBIOTIC 500-10000 UNIT/GM EX OINT
TOPICAL_OINTMENT | Freq: Two times a day (BID) | CUTANEOUS | Status: DC
  Filled 2011-06-27 (×18): qty 1

## 2011-06-27 MED ORDER — POLYETHYLENE GLYCOL 3350 17 G PO PACK
17.0000 g | PACK | Freq: Every day | ORAL | Status: DC
  Administered 2011-06-27 – 2011-06-28 (×2): 17 g via ORAL
  Filled 2011-06-27 (×3): qty 1

## 2011-06-27 NOTE — Progress Notes (Signed)
Subjective: Patient reports Feeling better no evidence of speech arrest  Objective: Vital signs in last 24 hours: Temp:  [95.4 F (35.2 C)-98.4 F (36.9 C)] 97.8 F (36.6 C) (06/18 0400) Pulse Rate:  [49-65] 56  (06/18 0700) Resp:  [14-48] 23  (06/18 0700) BP: (93-183)/(51-80) 143/67 mmHg (06/18 0700) SpO2:  [94 %-100 %] 100 % (06/18 0700)  Intake/Output from previous day: 06/17 0701 - 06/18 0700 In: 2235.8 [P.O.:360; I.V.:1723.8; IV Piggyback:152] Out: 2273 [Urine:2235; Drains:38] Intake/Output this shift:    Strength 5 out of 5 no pronator drift wound dry  Lab Results: No results found for this basename: WBC:2,HGB:2,HCT:2,PLT:2 in the last 72 hours BMET  [facility] 06/25/11 0948  NA 131*  K 4.8  CL 99  CO2 20  GLUCOSE 290*  BUN 22  CREATININE 1.14  CALCIUM 8.6    Studies/Results: No results found.  Assessment/Plan: On CT scan stable to cut his JP drain will transfer to the floor repeat CT in 2 days  LOS: 4 days     James Juarez P 06/27/2011, 7:48 AM

## 2011-06-27 NOTE — Progress Notes (Signed)
Pt had a BP of 189/94 and 187/79, was given PRN dose of Labetalol 20mg , in 15 minutes, Pt had a pressure of 187/77.  MD (Dr. Wynetta Emery) notified, awaiting for orders.  Patient is A/O=4 and overall is stable.  Will continue to monitor patient.

## 2011-06-28 LAB — GLUCOSE, CAPILLARY
Glucose-Capillary: 180 mg/dL — ABNORMAL HIGH (ref 70–99)
Glucose-Capillary: 161 mg/dL — ABNORMAL HIGH (ref 70–99)
Glucose-Capillary: 147 mg/dL — ABNORMAL HIGH (ref 70–99)
Glucose-Capillary: 211 mg/dL — ABNORMAL HIGH (ref 70–99)

## 2011-06-28 NOTE — Progress Notes (Signed)
Subjective: Patient reports Patient doing well today no headache all of the blood pressures last night but responded clonidine  Objective: Vital signs in last 24 hours: Temp:  [97.5 F (36.4 C)-98.4 F (36.9 C)] 97.8 F (36.6 C) (06/19 1357) Pulse Rate:  [58-62] 62  (06/19 1357) Resp:  [17-18] 18  (06/19 0838) BP: (90-175)/(49-83) 104/65 mmHg (06/19 1357) SpO2:  [97 %-99 %] 98 % (06/19 1357)  Intake/Output from previous day: 06/18 0701 - 06/19 0700 In: 255 [P.O.:180; I.V.:75] Out: 200 [Urine:200] Intake/Output this shift:    Awake alert oriented no evidence of focal findings incision dry  Lab Results: No results found for this basename: WBC:2,HGB:2,HCT:2,PLT:2 in the last 72 hours BMET No results found for this basename: NA:2,K:2,CL:2,CO2:2,GLUCOSE:2,BUN:2,CREATININE:2,CALCIUM:2 in the last 72 hours  Studies/Results: Ct Head Wo Contrast  06/27/2011  *RADIOLOGY REPORT*  Clinical Data: 71 year old male status post craniotomy for subdural hematoma.  CT HEAD WITHOUT CONTRAST  Technique:  Contiguous axial images were obtained from the base of the skull through the vertex without contrast.  Comparison: 06/25/2011 and earlier.  Findings: Previous left frontal craniotomy and right convexity burr holes. Stable visualized osseous structures.  An external scalp soft tissue drain remains in place.  Negative orbit soft tissues. Visualized paranasal sinuses and mastoids are clear.  Internal subdural space drain has been removed.  Pneumocephalus is decreased.  Small volume residual mixed density left subdural hematoma measures 5-7 mm in thickness.  Trace rightward midline shift is stable, and decreased since 06/21/2011.  Basilar cisterns remain patent. Stable ventricle size and configuration.  Stable gray-white matter differentiation throughout the brain.  No evidence of cortically based acute infarction identified.  No new intracranial hemorrhage.  IMPRESSION: 1.  Left subdural drain removed.   Decreased pneumocephalus.  Stable small mixed density residual left subdural hematoma, 5-7 mm in thickness. 2.  Stable trace rightward midline shift. 3.  No new intracranial abnormality.  Original Report Authenticated By: Harley Hallmark, M.D.    Assessment/Plan: Continued observation CT scan in the morning  LOS: 5 days     Latysha Thackston P 06/28/2011, 4:47 PM

## 2011-06-28 NOTE — Progress Notes (Signed)
Inpatient Diabetes Program Recommendations  AACE/ADA: New Consensus Statement on Inpatient Glycemic Control (2009)  Target Ranges:  Prepandial:   less than 140 mg/dL      Peak postprandial:   less than 180 mg/dL (1-2 hours)      Critically ill patients:  140 - 180 mg/dL   Reason for Visit: Results for JAMAAR, HOWES (MRN [id number]) as of 06/28/2011 13:15  Ref. Range 06/26/2011 17:59 06/26/2011 18:18 06/27/2011 21:17 06/28/2011 08:35  Glucose-Capillary Latest Range: 70-99 mg/dL 67 (L) 89 409 (H) 811 (H)    Inpatient Diabetes Program Recommendations Correction (SSI): Please check CBGs tid ac + HS and add Novolog Sensitive SSI if CBGs are elevated.  Note: Will follow.

## 2011-06-29 ENCOUNTER — Inpatient Hospital Stay (HOSPITAL_COMMUNITY): Payer: Medicare Other

## 2011-06-29 LAB — GLUCOSE, CAPILLARY
Glucose-Capillary: 149 mg/dL — ABNORMAL HIGH (ref 70–99)
Glucose-Capillary: 127 mg/dL — ABNORMAL HIGH (ref 70–99)
Glucose-Capillary: 147 mg/dL — ABNORMAL HIGH (ref 70–99)

## 2011-06-29 MED ORDER — SODIUM CHLORIDE 0.9 % IV SOLN: Freq: Once | INTRAVENOUS | Status: DC

## 2011-06-29 MED ORDER — ZOLPIDEM TARTRATE 10 MG PO TABS: 10.0000 mg | ORAL_TABLET | Freq: Every evening | ORAL | Status: DC | PRN

## 2011-06-29 MED ORDER — LEVETIRACETAM 750 MG PO TABS: 750.0000 mg | ORAL_TABLET | Freq: Two times a day (BID) | ORAL | Status: DC

## 2011-06-29 NOTE — Discharge Instructions (Signed)
No lifting no bending no twisting no driving keep the incision clean and dry

## 2011-06-29 NOTE — Discharge Summary (Signed)
Physician Discharge Summary  Patient ID: James Juarez MRN: [id number] DOB/AGE: 07/18/1940 71 y.o.  Admit date: 06/23/2011 Discharge date: 06/29/2011  Admission Diagnoses: Subdural hematoma  Discharge Diagnoses: Same Active Problems:  * No active hospital problems. *    Discharged Condition: good  Hospital Course: Patient was admitted from the CT scanner of the community back in the hospital on Wednesday when he was noted to have a recurrent subdural hematoma with episodes of speech arrest patient brought in the hospital underwent redo craniotomy for evacuation of recurrent subdural hematoma with placement of a ventricular catheter in the subdural space as well as the patient was placed on Keppra over the next couple days the episodes of speech arrest significantly decreased the CAT scan showed significant improvement in the subdural fluid collection. Or was maintained 500 was increased to 750 twice a day as patient had some breakthrough episodes of speech arrest the T. mobilize a subdural drain came out accidentally on Monday CT SCAN on Tuesday assessment CT on Thursday showed stable small rim of fluid collection that was noncompressive. She continued to do well had lobe of the trouble with hypertension was initiated on clonidine and now on the morning of discharge is having a little more trouble with hypotension which will be observed should the patient is blood pressure rebound and be stabilized over 100 systolic he can be discharged home later today. He'll be discharged with prescription for Keppra and to maintain his preoperative medications as before.  Consults: Significant Diagnostic Studies: Treatments: Redo craniotomy for evacuation of subdural Discharge Exam: Blood pressure 85/54, pulse 70, temperature 96.8 F (36 C), temperature source Oral, resp. rate 16, height 5\' 10"  (1.778 m), weight 76.9 kg (169 lb 8.5 oz), SpO2 98.00%. Awake alert oriented strength out of 5 wound clean and  dry  Disposition: Home   Medication List  As of 06/29/2011  6:58 AM   TAKE these medications         acetaminophen 325 MG tablet   Commonly known as: TYLENOL   Take 325 mg by mouth every 6 (six) hours as needed. For pain      albuterol 108 (90 BASE) MCG/ACT inhaler   Commonly known as: PROVENTIL HFA;VENTOLIN HFA   Inhale 2 puffs into the lungs every 6 (six) hours as needed. For shortness of breath      ALPRAZolam 0.25 MG tablet   Commonly known as: XANAX   Take 1 tablet (0.25 mg total) by mouth 3 (three) times daily as needed for anxiety.      clobetasol 0.05 % Gel   Commonly known as: TEMOVATE   Apply 1 application topically daily as needed. For face.      docusate sodium 100 MG capsule   Commonly known as: COLACE   Take 100 mg by mouth 2 (two) times daily. For constipation      fexofenadine 180 MG tablet   Commonly known as: ALLEGRA   Take 180 mg by mouth daily as needed. For allergies      glipiZIDE 5 MG 24 hr tablet   Commonly known as: GLUCOTROL XL   Take 5 mg by mouth daily.      HYDROcodone-acetaminophen 5-325 MG per tablet   Commonly known as: NORCO   Take 1 tablet by mouth every 4 (four) hours as needed.      hydroxypropyl methylcellulose 2.5 % ophthalmic solution   Commonly known as: ISOPTO TEARS   Place 1 drop into both eyes every other day.  levETIRAcetam 500 MG tablet   Commonly known as: KEPPRA   Take 500 mg by mouth 2 (two) times daily.      levothyroxine 50 MCG tablet   Commonly known as: SYNTHROID, LEVOTHROID   Take 50 mcg by mouth daily.      losartan 50 MG tablet   Commonly known as: COZAAR   Take 50 mg by mouth daily.      multivitamin with minerals Tabs   Take 1 tablet by mouth daily.      pioglitazone 30 MG tablet   Commonly known as: ACTOS   Take 30 mg by mouth daily.      rosuvastatin 5 MG tablet   Commonly known as: CRESTOR   Take 5 mg by mouth daily.      sitaGLIPtan-metformin 50-1000 MG per tablet   Commonly known as:  JANUMET   Take 1 tablet by mouth 2 (two) times daily with a meal.           Follow-up Information    Follow up in 1 week.         Signed: Prisha Hiley P 06/29/2011, 6:58 AM

## 2011-06-29 NOTE — Progress Notes (Signed)
Pts son called, states he isn't comfortable with his father being discharged today, would like for him to be monitored another day or two, since his blood pressure has been flucuating.

## 2011-06-29 NOTE — Progress Notes (Signed)
Pt discharged home, reviewed discharge instructions, and given RX.  Pt has already follow up appointment with Dr. Wynetta Emery.

## 2011-06-29 NOTE — Progress Notes (Signed)
Patient James Juarez, 71 year old male is resting comfortably.  Patient expressed appreciation for Chaplain's provision of pastoral presence and conversation.  I will follow-up as needed.

## 2011-06-30 LAB — GLUCOSE, CAPILLARY: Glucose-Capillary: 203 mg/dL — ABNORMAL HIGH (ref 70–99)

## 2011-06-30 NOTE — Care Management Note (Signed)
    Page 1 of 1   06/30/2011     9:52:55 AM   CARE MANAGEMENT NOTE 06/30/2011  Patient:  [facility]   Account Number:  [id number]  Date Initiated:  06/23/2011  Documentation initiated by:  [facility]  Subjective/Objective Assessment:   Admitted with SDH, planning redo craniotomy and ventricular drain placement.     Action/Plan:   PT eval after surgery   Anticipated DC Date:  06/26/2011   Anticipated DC Plan:  HOME W HOME HEALTH SERVICES      DC Planning Services  CM consult      Choice offered to / List presented to:             Status of service:  Completed, signed off Medicare Important Message given?   (If response is "NO", the following Medicare IM given date fields will be blank) Date Medicare IM given:   Date Additional Medicare IM given:    Discharge Disposition:  HOME/SELF CARE  Per UR Regulation:  Reviewed for med. necessity/level of care/duration of stay  If discussed at Long Length of Stay Meetings, dates discussed:    Comments:  06/29/11 Onnie Boer, RN, BSN [phone] PT WAS DC'D TO HOME WITH SELF CARE

## 2011-07-06 ENCOUNTER — Ambulatory Visit
Admission: RE | Admit: 2011-07-06 | Discharge: 2011-07-06 | Disposition: A | Discharge status: Home / Self Care | Payer: Medicare Other | Source: Ambulatory Visit | Attending: Neurosurgery | Admitting: Neurosurgery

## 2011-07-06 DIAGNOSIS — I6200 Nontraumatic subdural hemorrhage, unspecified: Secondary | ICD-10-CM

## 2011-07-06 DIAGNOSIS — I6789 Other cerebrovascular disease: Secondary | ICD-10-CM | POA: Diagnosis not present

## 2011-07-10 DIAGNOSIS — E119 Type 2 diabetes mellitus without complications: Secondary | ICD-10-CM | POA: Diagnosis not present

## 2011-07-10 DIAGNOSIS — D649 Anemia, unspecified: Secondary | ICD-10-CM | POA: Diagnosis not present

## 2011-07-10 DIAGNOSIS — I6200 Nontraumatic subdural hemorrhage, unspecified: Secondary | ICD-10-CM | POA: Diagnosis not present

## 2011-07-10 DIAGNOSIS — G4700 Insomnia, unspecified: Secondary | ICD-10-CM | POA: Diagnosis not present

## 2011-07-17 ENCOUNTER — Other Ambulatory Visit: Payer: Self-pay | Admitting: Neurosurgery

## 2011-07-17 DIAGNOSIS — I6200 Nontraumatic subdural hemorrhage, unspecified: Secondary | ICD-10-CM

## 2011-07-20 ENCOUNTER — Ambulatory Visit
Admission: RE | Admit: 2011-07-20 | Discharge: 2011-07-20 | Disposition: A | Discharge status: Home / Self Care | Payer: Medicare Other | Source: Ambulatory Visit | Attending: Neurosurgery | Admitting: Neurosurgery

## 2011-07-20 DIAGNOSIS — I6200 Nontraumatic subdural hemorrhage, unspecified: Secondary | ICD-10-CM

## 2011-07-21 DIAGNOSIS — D649 Anemia, unspecified: Secondary | ICD-10-CM | POA: Diagnosis not present

## 2011-07-21 DIAGNOSIS — Z125 Encounter for screening for malignant neoplasm of prostate: Secondary | ICD-10-CM | POA: Diagnosis not present

## 2011-07-21 DIAGNOSIS — E119 Type 2 diabetes mellitus without complications: Secondary | ICD-10-CM | POA: Diagnosis not present

## 2011-07-21 DIAGNOSIS — E039 Hypothyroidism, unspecified: Secondary | ICD-10-CM | POA: Diagnosis not present

## 2011-07-21 DIAGNOSIS — E7800 Pure hypercholesterolemia, unspecified: Secondary | ICD-10-CM | POA: Diagnosis not present

## 2011-07-25 DIAGNOSIS — E039 Hypothyroidism, unspecified: Secondary | ICD-10-CM | POA: Diagnosis not present

## 2011-07-25 DIAGNOSIS — E119 Type 2 diabetes mellitus without complications: Secondary | ICD-10-CM | POA: Diagnosis not present

## 2011-07-25 DIAGNOSIS — E7800 Pure hypercholesterolemia, unspecified: Secondary | ICD-10-CM | POA: Diagnosis not present

## 2011-07-25 DIAGNOSIS — E538 Deficiency of other specified B group vitamins: Secondary | ICD-10-CM | POA: Diagnosis not present

## 2011-08-18 ENCOUNTER — Other Ambulatory Visit: Payer: Self-pay | Admitting: Neurosurgery

## 2011-08-18 DIAGNOSIS — I6200 Nontraumatic subdural hemorrhage, unspecified: Secondary | ICD-10-CM

## 2011-08-21 DIAGNOSIS — E119 Type 2 diabetes mellitus without complications: Secondary | ICD-10-CM | POA: Diagnosis not present

## 2011-08-21 DIAGNOSIS — E039 Hypothyroidism, unspecified: Secondary | ICD-10-CM | POA: Diagnosis not present

## 2011-08-22 ENCOUNTER — Other Ambulatory Visit: Payer: Medicare Other

## 2011-08-22 ENCOUNTER — Ambulatory Visit
Admission: RE | Admit: 2011-08-22 | Discharge: 2011-08-22 | Disposition: A | Discharge status: Home / Self Care | Payer: Medicare Other | Source: Ambulatory Visit | Attending: Neurosurgery | Admitting: Neurosurgery

## 2011-08-22 DIAGNOSIS — I6200 Nontraumatic subdural hemorrhage, unspecified: Secondary | ICD-10-CM

## 2011-09-13 DIAGNOSIS — N529 Male erectile dysfunction, unspecified: Secondary | ICD-10-CM | POA: Diagnosis not present

## 2011-09-13 DIAGNOSIS — I10 Essential (primary) hypertension: Secondary | ICD-10-CM | POA: Diagnosis not present

## 2011-09-13 DIAGNOSIS — Z23 Encounter for immunization: Secondary | ICD-10-CM | POA: Diagnosis not present

## 2011-09-13 DIAGNOSIS — N182 Chronic kidney disease, stage 2 (mild): Secondary | ICD-10-CM | POA: Diagnosis not present

## 2011-11-17 ENCOUNTER — Other Ambulatory Visit: Payer: Self-pay | Admitting: Neurosurgery

## 2011-11-17 DIAGNOSIS — I6200 Nontraumatic subdural hemorrhage, unspecified: Secondary | ICD-10-CM

## 2011-11-21 ENCOUNTER — Ambulatory Visit
Admission: RE | Admit: 2011-11-21 | Discharge: 2011-11-21 | Disposition: A | Discharge status: Home / Self Care | Payer: Medicare Other | Source: Ambulatory Visit | Attending: Neurosurgery | Admitting: Neurosurgery

## 2011-11-21 DIAGNOSIS — I6200 Nontraumatic subdural hemorrhage, unspecified: Secondary | ICD-10-CM | POA: Diagnosis not present

## 2011-12-11 DIAGNOSIS — E7800 Pure hypercholesterolemia, unspecified: Secondary | ICD-10-CM | POA: Diagnosis not present

## 2011-12-11 DIAGNOSIS — E119 Type 2 diabetes mellitus without complications: Secondary | ICD-10-CM | POA: Diagnosis not present

## 2011-12-11 DIAGNOSIS — D649 Anemia, unspecified: Secondary | ICD-10-CM | POA: Diagnosis not present

## 2011-12-11 DIAGNOSIS — E039 Hypothyroidism, unspecified: Secondary | ICD-10-CM | POA: Diagnosis not present

## 2011-12-11 DIAGNOSIS — I10 Essential (primary) hypertension: Secondary | ICD-10-CM | POA: Diagnosis not present

## 2011-12-13 DIAGNOSIS — Z23 Encounter for immunization: Secondary | ICD-10-CM | POA: Diagnosis not present

## 2011-12-13 DIAGNOSIS — D649 Anemia, unspecified: Secondary | ICD-10-CM | POA: Diagnosis not present

## 2011-12-13 DIAGNOSIS — E119 Type 2 diabetes mellitus without complications: Secondary | ICD-10-CM | POA: Diagnosis not present

## 2011-12-13 DIAGNOSIS — E039 Hypothyroidism, unspecified: Secondary | ICD-10-CM | POA: Diagnosis not present

## 2011-12-13 DIAGNOSIS — I10 Essential (primary) hypertension: Secondary | ICD-10-CM | POA: Diagnosis not present

## 2011-12-13 DIAGNOSIS — E538 Deficiency of other specified B group vitamins: Secondary | ICD-10-CM | POA: Diagnosis not present

## 2011-12-18 DIAGNOSIS — D509 Iron deficiency anemia, unspecified: Secondary | ICD-10-CM | POA: Diagnosis not present

## 2011-12-21 DIAGNOSIS — H35379 Puckering of macula, unspecified eye: Secondary | ICD-10-CM | POA: Diagnosis not present

## 2011-12-21 DIAGNOSIS — H2510 Age-related nuclear cataract, unspecified eye: Secondary | ICD-10-CM | POA: Diagnosis not present

## 2011-12-21 DIAGNOSIS — H40019 Open angle with borderline findings, low risk, unspecified eye: Secondary | ICD-10-CM | POA: Diagnosis not present

## 2011-12-26 DIAGNOSIS — J069 Acute upper respiratory infection, unspecified: Secondary | ICD-10-CM | POA: Diagnosis not present

## 2011-12-26 DIAGNOSIS — Z1331 Encounter for screening for depression: Secondary | ICD-10-CM | POA: Diagnosis not present

## 2012-02-12 DIAGNOSIS — D509 Iron deficiency anemia, unspecified: Secondary | ICD-10-CM | POA: Diagnosis not present

## 2012-02-12 DIAGNOSIS — I10 Essential (primary) hypertension: Secondary | ICD-10-CM | POA: Diagnosis not present

## 2012-02-12 DIAGNOSIS — N644 Mastodynia: Secondary | ICD-10-CM | POA: Diagnosis not present

## 2012-02-12 DIAGNOSIS — N62 Hypertrophy of breast: Secondary | ICD-10-CM | POA: Diagnosis not present

## 2012-02-19 DIAGNOSIS — N500 Atrophy of testis: Secondary | ICD-10-CM | POA: Diagnosis not present

## 2012-02-26 DIAGNOSIS — N500 Atrophy of testis: Secondary | ICD-10-CM | POA: Diagnosis not present

## 2012-06-11 DIAGNOSIS — E1129 Type 2 diabetes mellitus with other diabetic kidney complication: Secondary | ICD-10-CM | POA: Diagnosis not present

## 2012-06-11 DIAGNOSIS — Z0000 Encounter for general adult medical examination without abnormal findings: Secondary | ICD-10-CM | POA: Diagnosis not present

## 2012-06-11 DIAGNOSIS — E785 Hyperlipidemia, unspecified: Secondary | ICD-10-CM | POA: Diagnosis not present

## 2012-06-11 DIAGNOSIS — E039 Hypothyroidism, unspecified: Secondary | ICD-10-CM | POA: Diagnosis not present

## 2012-06-11 DIAGNOSIS — N183 Chronic kidney disease, stage 3 unspecified: Secondary | ICD-10-CM | POA: Diagnosis not present

## 2012-06-11 DIAGNOSIS — M25519 Pain in unspecified shoulder: Secondary | ICD-10-CM | POA: Diagnosis not present

## 2012-07-29 ENCOUNTER — Other Ambulatory Visit: Payer: Self-pay | Admitting: *Deleted

## 2012-07-29 MED ORDER — ROSUVASTATIN CALCIUM 5 MG PO TABS: 5.0000 mg | ORAL_TABLET | Freq: Every day | ORAL | Status: DC

## 2012-08-15 ENCOUNTER — Other Ambulatory Visit: Payer: Self-pay | Admitting: *Deleted

## 2012-08-15 MED ORDER — GLUCOSE BLOOD VI STRP: ORAL_STRIP | Status: DC

## 2012-08-16 ENCOUNTER — Other Ambulatory Visit: Payer: Self-pay | Admitting: *Deleted

## 2012-08-16 DIAGNOSIS — E119 Type 2 diabetes mellitus without complications: Secondary | ICD-10-CM | POA: Insufficient documentation

## 2012-08-16 DIAGNOSIS — IMO0001 Reserved for inherently not codable concepts without codable children: Secondary | ICD-10-CM

## 2012-08-19 ENCOUNTER — Other Ambulatory Visit (INDEPENDENT_AMBULATORY_CARE_PROVIDER_SITE_OTHER): Payer: Medicare Other

## 2012-08-19 DIAGNOSIS — IMO0001 Reserved for inherently not codable concepts without codable children: Secondary | ICD-10-CM | POA: Diagnosis not present

## 2012-08-19 LAB — URINALYSIS
Bilirubin Urine: NEGATIVE
Ketones, ur: NEGATIVE
Leukocytes, UA: NEGATIVE
Nitrite: NEGATIVE
Specific Gravity, Urine: 1.015 (ref 1.000–1.030)
Total Protein, Urine: NEGATIVE
Urine Glucose: NEGATIVE
Urobilinogen, UA: 0.2 (ref 0.0–1.0)
pH: 6 (ref 5.0–8.0)

## 2012-08-19 LAB — COMPREHENSIVE METABOLIC PANEL
ALT: 20 U/L (ref 0–53)
AST: 25 U/L (ref 0–37)
Albumin: 4.3 g/dL (ref 3.5–5.2)
Alkaline Phosphatase: 54 U/L (ref 39–117)
BUN: 21 mg/dL (ref 6–23)
CO2: 27 mEq/L (ref 19–32)
Calcium: 9.3 mg/dL (ref 8.4–10.5)
Chloride: 103 mEq/L (ref 96–112)
Creatinine, Ser: 1.3 mg/dL (ref 0.4–1.5)
GFR: 59.98 mL/min — ABNORMAL LOW (ref >60.00)
Glucose, Bld: 98 mg/dL (ref 70–99)
Potassium: 4.4 mEq/L (ref 3.5–5.1)
Sodium: 136 mEq/L (ref 135–145)
Total Bilirubin: 0.6 mg/dL (ref 0.3–1.2)
Total Protein: 7.1 g/dL (ref 6.0–8.3)

## 2012-08-19 LAB — MICROALBUMIN / CREATININE URINE RATIO
Creatinine,U: 104.7 mg/dL
Microalb Creat Ratio: 0.2 mg/g (ref 0.0–30.0)
Microalb, Ur: 0.2 mg/dL (ref 0.0–1.9)

## 2012-08-19 LAB — HEMOGLOBIN A1C: Hgb A1c MFr Bld: 6.3 % (ref 4.6–6.5)

## 2012-08-21 ENCOUNTER — Encounter: Payer: Self-pay | Admitting: Endocrinology

## 2012-08-21 ENCOUNTER — Other Ambulatory Visit: Payer: Self-pay | Admitting: *Deleted

## 2012-08-21 ENCOUNTER — Ambulatory Visit: Payer: Medicare Other

## 2012-08-21 ENCOUNTER — Ambulatory Visit (INDEPENDENT_AMBULATORY_CARE_PROVIDER_SITE_OTHER): Payer: Medicare Other | Admitting: Endocrinology

## 2012-08-21 VITALS — BP 122/64 | HR 80 | Temp 97.8°F | Resp 12 | Ht 70.0 in | Wt 176.6 lb

## 2012-08-21 DIAGNOSIS — E291 Testicular hypofunction: Secondary | ICD-10-CM

## 2012-08-21 DIAGNOSIS — E039 Hypothyroidism, unspecified: Secondary | ICD-10-CM | POA: Diagnosis not present

## 2012-08-21 DIAGNOSIS — N189 Chronic kidney disease, unspecified: Secondary | ICD-10-CM | POA: Diagnosis not present

## 2012-08-21 DIAGNOSIS — IMO0001 Reserved for inherently not codable concepts without codable children: Secondary | ICD-10-CM | POA: Diagnosis not present

## 2012-08-21 DIAGNOSIS — I10 Essential (primary) hypertension: Secondary | ICD-10-CM | POA: Diagnosis not present

## 2012-08-21 DIAGNOSIS — E7800 Pure hypercholesterolemia, unspecified: Secondary | ICD-10-CM

## 2012-08-21 DIAGNOSIS — D649 Anemia, unspecified: Secondary | ICD-10-CM

## 2012-08-21 LAB — LIPID PANEL
Cholesterol: 164 mg/dL (ref 0–200)
HDL: 51.9 mg/dL (ref >39.00)
LDL Cholesterol: 97 mg/dL (ref 0–99)
Total CHOL/HDL Ratio: 3
Triglycerides: 77 mg/dL (ref 0.0–149.0)
VLDL: 15.4 mg/dL (ref 0.0–40.0)

## 2012-08-21 MED ORDER — LOSARTAN POTASSIUM 50 MG PO TABS: 50.0000 mg | ORAL_TABLET | Freq: Every day | ORAL | Status: DC

## 2012-08-21 MED ORDER — LEVOTHYROXINE SODIUM 50 MCG PO TABS: 50.0000 ug | ORAL_TABLET | Freq: Every day | ORAL | Status: DC

## 2012-08-21 MED ORDER — ACCU-CHEK FASTCLIX LANCETS MISC: 1.0000 | Freq: Two times a day (BID) | Status: DC

## 2012-08-21 MED ORDER — LEVOTHYROXINE SODIUM 75 MCG PO TABS: 75.0000 ug | ORAL_TABLET | Freq: Every day | ORAL | Status: DC

## 2012-08-21 MED ORDER — PIOGLITAZONE HCL 30 MG PO TABS: 30.0000 mg | ORAL_TABLET | Freq: Every day | ORAL | Status: DC

## 2012-08-21 MED ORDER — GLUCOSE BLOOD VI STRP: ORAL_STRIP | Status: DC

## 2012-08-21 NOTE — Patient Instructions (Signed)
Please check blood sugars at least half the time about 2 hours after any meal and as directed on waking up. Please bring blood sugar monitor to each visit  Appointment on 08/19/2012  Component Date Value Range Status  . Hemoglobin A1C 08/19/2012 6.3  4.6 - 6.5 % Final   Glycemic Control Guidelines for People with Diabetes:Non Diabetic:  <6%Goal of Therapy: <7%Additional Action Suggested:  >8%   . Microalb, Ur 08/19/2012 0.2  0.0 - 1.9 mg/dL Final  . Creatinine,U 78/29/5621 104.7   Final  . Microalb Creat Ratio 08/19/2012 0.2  0.0 - 30.0 mg/g Final  . Color, Urine 08/19/2012 LT. YELLOW  Yellow;Lt. Yellow Final  . APPearance 08/19/2012 CLEAR  Clear Final  . Specific Gravity, Urine 08/19/2012 1.015  1.000-1.030 Final  . pH 08/19/2012 6.0  5.0 - 8.0 Final  . Total Protein, Urine 08/19/2012 NEGATIVE  Negative Final  . Urine Glucose 08/19/2012 NEGATIVE  Negative Final  . Ketones, ur 08/19/2012 NEGATIVE  Negative Final  . Bilirubin Urine 08/19/2012 NEGATIVE  Negative Final  . Hgb urine dipstick 08/19/2012 TRACE-INTACT  Negative Final  . Urobilinogen, UA 08/19/2012 0.2  0.0 - 1.0 Final  . Leukocytes, UA 08/19/2012 NEGATIVE  Negative Final  . Nitrite 08/19/2012 NEGATIVE  Negative Final  . Sodium 08/19/2012 136  135 - 145 mEq/L Final  . Potassium 08/19/2012 4.4  3.5 - 5.1 mEq/L Final  . Chloride 08/19/2012 103  96 - 112 mEq/L Final  . CO2 08/19/2012 27  19 - 32 mEq/L Final  . Glucose, Bld 08/19/2012 98  70 - 99 mg/dL Final  . BUN 30/86/5784 21  6 - 23 mg/dL Final  . Creatinine, Ser 08/19/2012 1.3  0.4 - 1.5 mg/dL Final  . Total Bilirubin 08/19/2012 0.6  0.3 - 1.2 mg/dL Final  . Alkaline Phosphatase 08/19/2012 54  39 - 117 U/L Final  . AST 08/19/2012 25  0 - 37 U/L Final  . ALT 08/19/2012 20  0 - 53 U/L Final  . Total Protein 08/19/2012 7.1  6.0 - 8.3 g/dL Final  . Albumin 69/62/9528 4.3  3.5 - 5.2 g/dL Final  . Calcium 41/32/4401 9.3  8.4 - 10.5 mg/dL Final  . GFR 02/72/5366 59.98* >60.00  mL/min Final

## 2012-08-21 NOTE — Progress Notes (Addendum)
Patient ID: James Juarez, male   DOB: 1940/10/05, 72 y.o.   MRN: [id number]  James Juarez is an 72 y.o. male.   Reason for Appointment: Diabetes follow-up   History of Present Illness   Diagnosis: Type 2 DIABETES MELITUS  He has had long-standing diabetes which has been well-controlled with a multidrug regimen which has been unchanged for a few years He is only taking his readings very sporadically recently and these appear to be fairly good Also usually is fairly compliant with his diet and exercise regimen and has been able to maintain his weight A1c is usually near 6%  No hypoglycemia with low-dose glipizide XL     Oral hypoglycemic drugs: Glipizide ER, Actos and Janumet        Side effects from medications: None Proper timing of medications in relation to meals: Yes.         Monitors blood glucose: Once a day.    Glucometer: ? Accu-Chek    Blood Glucose readings from meter download: readings before breakfast: 115, nonfasting 102, 139 yesterday   Hypoglycemia frequency: Never.          Meals: 3 meals per day.          Physical activity: exercise: Walking 30-40 min, 5/7             Wt Readings from Last 3 Encounters:  08/21/12 176 lb 9.6 oz (80.105 kg)  06/26/11 169 lb 8.5 oz (76.9 kg)  06/26/11 169 lb 8.5 oz (76.9 kg)    Appointment on 08/19/2012  Component Date Value Range Status  . Hemoglobin A1C 08/19/2012 6.3  4.6 - 6.5 % Final   Glycemic Control Guidelines for People with Diabetes:Non Diabetic:  <6%Goal of Therapy: <7%Additional Action Suggested:  >8%   . Microalb, Ur 08/19/2012 0.2  0.0 - 1.9 mg/dL Final  . Creatinine,U 40/98/1191 104.7   Final  . Microalb Creat Ratio 08/19/2012 0.2  0.0 - 30.0 mg/g Final  . Color, Urine 08/19/2012 LT. YELLOW  Yellow;Lt. Yellow Final  . APPearance 08/19/2012 CLEAR  Clear Final  . Specific Gravity, Urine 08/19/2012 1.015  1.000-1.030 Final  . pH 08/19/2012 6.0  5.0 - 8.0 Final  . Total Protein, Urine 08/19/2012 NEGATIVE  Negative Final   . Urine Glucose 08/19/2012 NEGATIVE  Negative Final  . Ketones, ur 08/19/2012 NEGATIVE  Negative Final  . Bilirubin Urine 08/19/2012 NEGATIVE  Negative Final  . Hgb urine dipstick 08/19/2012 TRACE-INTACT  Negative Final  . Urobilinogen, UA 08/19/2012 0.2  0.0 - 1.0 Final  . Leukocytes, UA 08/19/2012 NEGATIVE  Negative Final  . Nitrite 08/19/2012 NEGATIVE  Negative Final  . Sodium 08/19/2012 136  135 - 145 mEq/L Final  . Potassium 08/19/2012 4.4  3.5 - 5.1 mEq/L Final  . Chloride 08/19/2012 103  96 - 112 mEq/L Final  . CO2 08/19/2012 27  19 - 32 mEq/L Final  . Glucose, Bld 08/19/2012 98  70 - 99 mg/dL Final  . BUN 47/82/9562 21  6 - 23 mg/dL Final  . Creatinine, Ser 08/19/2012 1.3  0.4 - 1.5 mg/dL Final  . Total Bilirubin 08/19/2012 0.6  0.3 - 1.2 mg/dL Final  . Alkaline Phosphatase 08/19/2012 54  39 - 117 U/L Final  . AST 08/19/2012 25  0 - 37 U/L Final  . ALT 08/19/2012 20  0 - 53 U/L Final  . Total Protein 08/19/2012 7.1  6.0 - 8.3 g/dL Final  . Albumin 13/08/6576 4.3  3.5 - 5.2 g/dL  Final  . Calcium 08/19/2012 9.3  8.4 - 10.5 mg/dL Final  . GFR 21/30/8657 59.98* >60.00 mL/min Final      Medication List       This list is accurate as of: 08/21/12 10:10 AM.  Always use your most recent med list.               acetaminophen 325 MG tablet  Commonly known as:  TYLENOL  Take 325 mg by mouth every 6 (six) hours as needed. For pain     albuterol 108 (90 BASE) MCG/ACT inhaler  Commonly known as:  PROVENTIL HFA;VENTOLIN HFA  Inhale 2 puffs into the lungs every 6 (six) hours as needed. For shortness of breath     clobetasol 0.05 % Gel  Commonly known as:  TEMOVATE  Apply 1 application topically daily as needed. For face.     fexofenadine 180 MG tablet  Commonly known as:  ALLEGRA  Take 180 mg by mouth daily as needed. For allergies     glipiZIDE 5 MG 24 hr tablet  Commonly known as:  GLUCOTROL XL  Take 5 mg by mouth daily.     glucose blood test strip  Commonly known  as:  ACCU-CHEK AVIVA  Use to check blood sugars twice a day     hydroxypropyl methylcellulose 2.5 % ophthalmic solution  Commonly known as:  ISOPTO TEARS  Place 1 drop into both eyes every other day.     levothyroxine 50 MCG tablet  Commonly known as:  SYNTHROID, LEVOTHROID  Take 1 tablet (50 mcg total) by mouth daily.     losartan 50 MG tablet  Commonly known as:  COZAAR  Take 1 tablet (50 mg total) by mouth daily.     multivitamin with minerals Tabs tablet  Take 1 tablet by mouth daily.     pioglitazone 30 MG tablet  Commonly known as:  ACTOS  Take 1 tablet (30 mg total) by mouth daily.     rosuvastatin 5 MG tablet  Commonly known as:  CRESTOR  Take 1 tablet (5 mg total) by mouth daily.     sitaGLIPtan-metformin 50-1000 MG per tablet  Commonly known as:  JANUMET  Take 1 tablet by mouth 2 (two) times daily with a meal.        Allergies: No Known Allergies  Past Medical History  Diagnosis Date  . Diabetes mellitus   . Hypertension   . Asthma   . Diverticular disease   . Subdural hematoma May 2013    bilateral     Past Surgical History  Procedure Laterality Date  . Hernia repair    . Burr hole  05/22/2011    Procedure: [name];  Surgeon: Mariam Dollar, MD;  Location: [facility];  Service: Neurosurgery;  Laterality: Right;  Right Burr Holes for evacuation of subdural hematoma  . Burr hole  05/23/2011    Procedure: [name];  Surgeon: Mariam Dollar, MD;  Location: [facility];  Service: Neurosurgery;  Laterality: Left;  Left [facility]  . Craniotomy  06/14/2011    Procedure: CRANIOTOMY HEMATOMA EVACUATION SUBDURAL;  Surgeon: Mariam Dollar, MD;  Location: [facility];  Service: Neurosurgery;  Laterality: N/A;  Left Craniotomy for subdural hematoma  . Craniotomy  06/23/2011    Procedure: CRANIOTOMY HEMATOMA EVACUATION SUBDURAL;  Surgeon: Mariam Dollar, MD;  Location: [facility];  Service: Neurosurgery;  Laterality: Left;  Redo Craniotomy for Subdural Hematoma    No  family history on file.  Social History:  reports that he has quit smoking. He quit smokeless tobacco use about 34 years ago. He reports that he does not drink alcohol or use illicit drugs.  Review of Systems:  HYPERTENSION:  he has had hypertension for several years although recently has been requiring very little medication. Home systolic readings are 116-118 recently and he has no lightheadedness  HYPERLIPIDEMIA: The lipid abnormality consists of elevated LDL, well controlled with 5 mg Crestor  HYPOTHYROIDISM he has had long-standing mild hypothyroidism and usually requires a stable dose of supplement, last TSH level normal at 0.86 and is taking 75 mcg regularly  No recent headaches but is asking about Some depression of his old burr hole area on the skull.  No difficulties with neuropathy  He has had anemia which is multifactorial and has been given iron by PCP. His last hemoglobin was still below normal at 11.0 but he does not have any unusual fatigue. Still is taking his B12 supplement for pernicious anemia  PRIMARY HYPOGONADISM: He has minimal symptoms and his diagnosis was made in 2/14 with a free T level of 4.8, increased LH of 33     Examination:   BP 122/64  Pulse 80  Temp(Src) 97.8 F (36.6 C)  Resp 12  Ht 5\' 10"  (1.778 m)  Wt 176 lb 9.6 oz (80.105 kg)  BMI 25.34 kg/m2  SpO2 97%  Body mass index is 25.34 kg/(m^2).   Diabetic foot exam done  ASSESSMENT/ PLAN::   Diabetes type 2   The patient's diabetes control appears to be overall very well controlled and A1c is upper normal at 6.3 Discussed needing to check more glucose  HYPERTENSION: Blood pressure is excellent, no lightheadedness.  Renal dysfunction: Has mild renal dysfunction, etiology unclear with creatinine upper normal  Hypothyroidism: He will continue the same dosage  Hypercholesterolemia: No change in medication  Anemia: Reminded him to make a followup appointment with PCP for this, meanwhile  continue iron and B12   Hypogonadism: Again no significant symptoms and will reevaluate him if he is symptomatic  Lilliahna Schubring 08/21/2012, 10:10 AM

## 2012-08-22 DIAGNOSIS — D649 Anemia, unspecified: Secondary | ICD-10-CM | POA: Insufficient documentation

## 2012-08-22 DIAGNOSIS — E039 Hypothyroidism, unspecified: Secondary | ICD-10-CM | POA: Insufficient documentation

## 2012-08-22 DIAGNOSIS — E291 Testicular hypofunction: Secondary | ICD-10-CM | POA: Insufficient documentation

## 2012-08-22 DIAGNOSIS — N189 Chronic kidney disease, unspecified: Secondary | ICD-10-CM | POA: Insufficient documentation

## 2012-08-22 DIAGNOSIS — I10 Essential (primary) hypertension: Secondary | ICD-10-CM | POA: Insufficient documentation

## 2012-09-19 DIAGNOSIS — Z8679 Personal history of other diseases of the circulatory system: Secondary | ICD-10-CM | POA: Diagnosis not present

## 2012-09-19 DIAGNOSIS — E7800 Pure hypercholesterolemia, unspecified: Secondary | ICD-10-CM | POA: Diagnosis not present

## 2012-09-19 DIAGNOSIS — I10 Essential (primary) hypertension: Secondary | ICD-10-CM | POA: Diagnosis not present

## 2012-09-19 DIAGNOSIS — E119 Type 2 diabetes mellitus without complications: Secondary | ICD-10-CM | POA: Diagnosis not present

## 2012-11-05 DIAGNOSIS — Z23 Encounter for immunization: Secondary | ICD-10-CM | POA: Diagnosis not present

## 2012-11-07 DIAGNOSIS — I6200 Nontraumatic subdural hemorrhage, unspecified: Secondary | ICD-10-CM | POA: Diagnosis not present

## 2012-11-07 DIAGNOSIS — E663 Overweight: Secondary | ICD-10-CM | POA: Diagnosis not present

## 2012-11-14 ENCOUNTER — Other Ambulatory Visit: Payer: Self-pay

## 2012-11-14 ENCOUNTER — Other Ambulatory Visit: Payer: Self-pay | Admitting: Neurosurgery

## 2012-11-14 DIAGNOSIS — I6200 Nontraumatic subdural hemorrhage, unspecified: Secondary | ICD-10-CM

## 2012-11-18 ENCOUNTER — Ambulatory Visit
Admission: RE | Admit: 2012-11-18 | Discharge: 2012-11-18 | Disposition: A | Discharge status: Home / Self Care | Payer: Medicare Other | Source: Ambulatory Visit | Attending: Neurosurgery | Admitting: Neurosurgery

## 2012-11-18 DIAGNOSIS — I6200 Nontraumatic subdural hemorrhage, unspecified: Secondary | ICD-10-CM

## 2012-11-18 DIAGNOSIS — R51 Headache: Secondary | ICD-10-CM | POA: Diagnosis not present

## 2012-11-18 MED ORDER — IOHEXOL 300 MG/ML  SOLN
75.0000 mL | Freq: Once | INTRAMUSCULAR | Status: AC | PRN
  Administered 2012-11-18: 75 mL via INTRAVENOUS

## 2012-11-19 DIAGNOSIS — I6200 Nontraumatic subdural hemorrhage, unspecified: Secondary | ICD-10-CM | POA: Diagnosis not present

## 2012-11-21 ENCOUNTER — Other Ambulatory Visit: Payer: Self-pay | Admitting: *Deleted

## 2012-11-21 MED ORDER — SITAGLIPTIN PHOS-METFORMIN HCL 50-1000 MG PO TABS: 1.0000 | ORAL_TABLET | Freq: Two times a day (BID) | ORAL | Status: DC

## 2012-12-19 ENCOUNTER — Other Ambulatory Visit: Payer: Self-pay | Admitting: *Deleted

## 2012-12-19 MED ORDER — ROSUVASTATIN CALCIUM 5 MG PO TABS: 5.0000 mg | ORAL_TABLET | Freq: Every day | ORAL | Status: DC

## 2012-12-19 MED ORDER — LOSARTAN POTASSIUM 50 MG PO TABS: 50.0000 mg | ORAL_TABLET | Freq: Every day | ORAL | Status: DC

## 2012-12-19 MED ORDER — GLIPIZIDE ER 5 MG PO TB24: 5.0000 mg | ORAL_TABLET | Freq: Every day | ORAL | Status: DC

## 2012-12-19 MED ORDER — LEVOTHYROXINE SODIUM 75 MCG PO TABS: 75.0000 ug | ORAL_TABLET | Freq: Every day | ORAL | Status: DC

## 2012-12-19 MED ORDER — PIOGLITAZONE HCL 30 MG PO TABS: 30.0000 mg | ORAL_TABLET | Freq: Every day | ORAL | Status: DC

## 2013-01-03 DIAGNOSIS — R6889 Other general symptoms and signs: Secondary | ICD-10-CM | POA: Diagnosis not present

## 2013-01-08 DIAGNOSIS — J069 Acute upper respiratory infection, unspecified: Secondary | ICD-10-CM | POA: Diagnosis not present

## 2013-01-08 DIAGNOSIS — R062 Wheezing: Secondary | ICD-10-CM | POA: Diagnosis not present

## 2013-01-08 DIAGNOSIS — E1129 Type 2 diabetes mellitus with other diabetic kidney complication: Secondary | ICD-10-CM | POA: Diagnosis not present

## 2013-03-25 ENCOUNTER — Other Ambulatory Visit: Payer: Medicare Other

## 2013-03-27 ENCOUNTER — Ambulatory Visit: Payer: Medicare Other | Admitting: Endocrinology

## 2013-04-14 ENCOUNTER — Other Ambulatory Visit: Payer: Self-pay | Admitting: Endocrinology

## 2013-04-14 ENCOUNTER — Other Ambulatory Visit (INDEPENDENT_AMBULATORY_CARE_PROVIDER_SITE_OTHER): Payer: Medicare Other

## 2013-04-14 ENCOUNTER — Other Ambulatory Visit: Payer: Self-pay | Admitting: *Deleted

## 2013-04-14 ENCOUNTER — Telehealth: Payer: Self-pay | Admitting: Endocrinology

## 2013-04-14 DIAGNOSIS — D649 Anemia, unspecified: Secondary | ICD-10-CM

## 2013-04-14 DIAGNOSIS — E039 Hypothyroidism, unspecified: Secondary | ICD-10-CM | POA: Diagnosis not present

## 2013-04-14 DIAGNOSIS — IMO0001 Reserved for inherently not codable concepts without codable children: Secondary | ICD-10-CM

## 2013-04-14 DIAGNOSIS — E1165 Type 2 diabetes mellitus with hyperglycemia: Principal | ICD-10-CM

## 2013-04-14 DIAGNOSIS — E538 Deficiency of other specified B group vitamins: Secondary | ICD-10-CM

## 2013-04-14 LAB — IBC PANEL
Iron: 89 ug/dL (ref 42–165)
Saturation Ratios: 20.7 % (ref 20.0–50.0)
Transferrin: 307.1 mg/dL (ref 212.0–360.0)

## 2013-04-14 LAB — TSH: TSH: 0.63 u[IU]/mL (ref 0.35–5.50)

## 2013-04-14 LAB — T4, FREE: Free T4: 1.15 ng/dL (ref 0.60–1.60)

## 2013-04-14 NOTE — Telephone Encounter (Signed)
Pt would like to speak with Suanne Marker regarding his labs   Call back: [phone]  Thank You :)

## 2013-04-15 DIAGNOSIS — H2510 Age-related nuclear cataract, unspecified eye: Secondary | ICD-10-CM | POA: Diagnosis not present

## 2013-04-15 DIAGNOSIS — H40019 Open angle with borderline findings, low risk, unspecified eye: Secondary | ICD-10-CM | POA: Diagnosis not present

## 2013-04-15 DIAGNOSIS — H5200 Hypermetropia, unspecified eye: Secondary | ICD-10-CM | POA: Diagnosis not present

## 2013-04-15 DIAGNOSIS — H35369 Drusen (degenerative) of macula, unspecified eye: Secondary | ICD-10-CM | POA: Diagnosis not present

## 2013-04-15 DIAGNOSIS — H04129 Dry eye syndrome of unspecified lacrimal gland: Secondary | ICD-10-CM | POA: Diagnosis not present

## 2013-04-15 DIAGNOSIS — H25019 Cortical age-related cataract, unspecified eye: Secondary | ICD-10-CM | POA: Diagnosis not present

## 2013-04-15 LAB — LIPID PANEL
Cholesterol: 160 mg/dL (ref 0–200)
HDL: 51.3 mg/dL (ref >39.00)
LDL Cholesterol: 94 mg/dL (ref 0–99)
Total CHOL/HDL Ratio: 3
Triglycerides: 75 mg/dL (ref 0.0–149.0)
VLDL: 15 mg/dL (ref 0.0–40.0)

## 2013-04-15 LAB — VITAMIN B12: Vitamin B-12: 1165 pg/mL — ABNORMAL HIGH (ref 211–911)

## 2013-04-15 LAB — HM DIABETES EYE EXAM

## 2013-04-18 ENCOUNTER — Ambulatory Visit (INDEPENDENT_AMBULATORY_CARE_PROVIDER_SITE_OTHER): Payer: Medicare Other | Admitting: Endocrinology

## 2013-04-18 ENCOUNTER — Encounter: Payer: Self-pay | Admitting: Endocrinology

## 2013-04-18 VITALS — BP 122/70 | HR 76 | Temp 98.1°F | Resp 14 | Ht 70.0 in | Wt 182.0 lb

## 2013-04-18 DIAGNOSIS — I10 Essential (primary) hypertension: Secondary | ICD-10-CM | POA: Diagnosis not present

## 2013-04-18 DIAGNOSIS — E1165 Type 2 diabetes mellitus with hyperglycemia: Principal | ICD-10-CM

## 2013-04-18 DIAGNOSIS — IMO0001 Reserved for inherently not codable concepts without codable children: Secondary | ICD-10-CM

## 2013-04-18 DIAGNOSIS — E039 Hypothyroidism, unspecified: Secondary | ICD-10-CM | POA: Diagnosis not present

## 2013-04-18 DIAGNOSIS — D649 Anemia, unspecified: Secondary | ICD-10-CM | POA: Diagnosis not present

## 2013-04-18 DIAGNOSIS — E7800 Pure hypercholesterolemia, unspecified: Secondary | ICD-10-CM

## 2013-04-18 NOTE — Progress Notes (Signed)
Patient ID: James Juarez, male   DOB: 11/13/1940, 73 y.o.   MRN: [id number]   Reason for Appointment: Diabetes follow-up   History of Present Illness   Diagnosis: Type 2 DIABETES MELITUS  He has had long-standing diabetes which has been well-controlled with a multidrug regimen which has been unchanged for a few years He is only taking his blood sugar readings sporadically   He is usually is fairly compliant with his diet and exercise regimen  However has gained back 6 pounds since his last visit probably because of [place] A1c is usually near 6%, level not drawn by lab on this visit No hypoglycemia with low-dose glipizide XL although he had an asymptomatic reading of 66 on 1 night     Oral hypoglycemic drugs: Glipizide ER, Actos and Janumet        Side effects from medications: None Proper timing of medications in relation to meals: Yes.         Monitors blood glucose: Once a day.    Glucometer: ? Accu-Chek    Blood Glucose readings from meter download: Mostly near normal with lowest reading 66 and  Hypoglycemia: None           Meals: 3 meals per day.          Physical activity: exercise: Walking 30-40 min, 5/7 days a week, more recently             Wt Readings from Last 3 Encounters:  04/18/13 182 lb (82.555 kg)  08/21/12 176 lb 9.6 oz (80.105 kg)  06/26/11 169 lb 8.5 oz (76.9 kg)   LABS:  Lab Results  Component Value Date   HGBA1C 6.3 08/19/2012   Lab Results  Component Value Date   MICROALBUR 0.2 08/19/2012   LDLCALC 94 04/14/2013   CREATININE 1.3 08/19/2012     Appointment on 04/14/2013  Component Date Value Ref Range Status  . Cholesterol 04/14/2013 160  0 - 200 mg/dL Final   ATP III Classification       Desirable:  < 200 mg/dL               Borderline High:  200 - 239 mg/dL          High:  > = 240 mg/dL  . Triglycerides 04/14/2013 75.0  0.0 - 149.0 mg/dL Final   Normal:  <150 mg/dLBorderline High:  150 - 199 mg/dL  . HDL 04/14/2013 51.30  >39.00  mg/dL Final  . VLDL 04/14/2013 15.0  0.0 - 40.0 mg/dL Final  . LDL Cholesterol 04/14/2013 94  0 - 99 mg/dL Final  . Total CHOL/HDL Ratio 04/14/2013 3   Final                  Men          Women1/2 Average Risk     3.4          3.3Average Risk          5.0          4.42X Average Risk          9.6          7.13X Average Risk          15.0          11.0                      Appointment on 04/14/2013  Component Date Value Ref Range  Status  . Free T4 04/14/2013 1.15  0.60 - 1.60 ng/dL Final  . TSH 04/14/2013 0.63  0.35 - 5.50 uIU/mL Final  . Iron 04/14/2013 89  42 - 165 ug/dL Final  . Transferrin 04/14/2013 307.1  212.0 - 360.0 mg/dL Final  . Saturation Ratios 04/14/2013 20.7  20.0 - 50.0 % Final  . Vitamin B-12 04/14/2013 1165* 211 - 911 pg/mL Final      Medication List       This list is accurate as of: 04/18/13  3:24 PM.  Always use your most recent med list.               ACCU-CHEK FASTCLIX LANCETS Misc  1 each by Does not apply route 2 (two) times daily. Dx code 250.00     acetaminophen 325 MG tablet  Commonly known as:  TYLENOL  Take 325 mg by mouth every 6 (six) hours as needed. For pain     albuterol 108 (90 BASE) MCG/ACT inhaler  Commonly known as:  PROVENTIL HFA;VENTOLIN HFA  Inhale 2 puffs into the lungs every 6 (six) hours as needed. For shortness of breath     clobetasol 0.05 % Gel  Commonly known as:  TEMOVATE  Apply 1 application topically daily as needed. For face.     fexofenadine 180 MG tablet  Commonly known as:  ALLEGRA  Take 180 mg by mouth daily as needed. For allergies     glipiZIDE 5 MG 24 hr tablet  Commonly known as:  GLUCOTROL XL  Take 1 tablet (5 mg total) by mouth daily.     glucose blood test strip  Commonly known as:  ACCU-CHEK SMARTVIEW  Use as instructed to check blood sugars two times a day, Dx code 250.00     hydroxypropyl methylcellulose 2.5 % ophthalmic solution  Commonly known as:  ISOPTO TEARS  Place 1 drop into both eyes every  other day.     levothyroxine 75 MCG tablet  Commonly known as:  SYNTHROID, LEVOTHROID  Take 1 tablet (75 mcg total) by mouth daily before breakfast.     losartan 50 MG tablet  Commonly known as:  COZAAR  Take 1 tablet (50 mg total) by mouth daily.     multivitamin with minerals Tabs tablet  Take 1 tablet by mouth daily.     pioglitazone 30 MG tablet  Commonly known as:  ACTOS  Take 1 tablet (30 mg total) by mouth daily.     rosuvastatin 5 MG tablet  Commonly known as:  CRESTOR  Take 1 tablet (5 mg total) by mouth daily.     sitaGLIPtin-metformin 50-1000 MG per tablet  Commonly known as:  JANUMET  Take 1 tablet by mouth 2 (two) times daily with a meal.        Allergies: No Known Allergies  Past Medical History  Diagnosis Date  . Diabetes mellitus   . Hypertension   . Asthma   . Diverticular disease   . Subdural hematoma May 2013    bilateral     Past Surgical History  Procedure Laterality Date  . Hernia repair    . Burr hole  05/22/2011    Procedure: Haskell Flirt;  Surgeon: Elaina Hoops, MD;  Location: [place] [facility];  Service: Neurosurgery;  Laterality: Right;  Right Burr Holes for evacuation of subdural hematoma  . Burr hole  05/23/2011    Procedure: Haskell Flirt;  Surgeon: Elaina Hoops, MD;  Location: [place] [facility];  Service: Neurosurgery;  Laterality: Left;  Left [facility]  . Craniotomy  06/14/2011    Procedure: CRANIOTOMY HEMATOMA EVACUATION SUBDURAL;  Surgeon: Elaina Hoops, MD;  Location: [place] [facility];  Service: Neurosurgery;  Laterality: N/A;  Left Craniotomy for subdural hematoma  . Craniotomy  06/23/2011    Procedure: CRANIOTOMY HEMATOMA EVACUATION SUBDURAL;  Surgeon: Elaina Hoops, MD;  Location: [place] [facility];  Service: Neurosurgery;  Laterality: Left;  Redo Craniotomy for Subdural Hematoma    No family history on file.  Social History:  reports that he has quit smoking. He quit smokeless tobacco use about 35 years ago. He reports that he does not drink alcohol or use  illicit drugs.  Review of Systems:  HYPERTENSION:  he has had hypertension for several years although recently has been requiring only low-dose medications. Currently on 50 mg losartan  Home systolic readings are 163-846 recently and he has no lightheadedness  HYPERLIPIDEMIA: The lipid abnormality consists of elevated LDL, well controlled with 5 mg Crestor  Lab Results  Component Value Date   CHOL 160 04/14/2013   HDL 51.30 04/14/2013   LDLCALC 94 04/14/2013   TRIG 75.0 04/14/2013   CHOLHDL 3 04/14/2013    HYPOTHYROIDISM he has had long-standing mild hypothyroidism and usually requires a stable dose of supplement,  TSH level normal again and is taking 75 mcg regularly  Lab Results  Component Value Date   TSH 0.63 04/14/2013    No difficulties with numbness in his feet. Diabetic foot exam done in 8/14   He has had anemia which is multifactorial and has been given iron by PCP.   Still is taking his B12 supplement for pernicious anemia, he thinks he is taking 500 mcg but not every day Asking about his recent levels which have not been checked by PCP  He is asking about PSA screening and discussed current guidelines not recommending this especially after 870  PRIMARY HYPOGONADISM: He has minimal symptoms and his diagnosis was made in 2/14 with a free T level of 4.8, increased LH of 33 He was not interested in medications as he was asymptomatic     Examination:   BP 122/70  Pulse 76  Temp(Src) 98.1 F (36.7 C)  Resp 14  Ht 5\' 10"  (1.778 m)  Wt 182 lb (82.555 kg)  BMI 26.11 kg/m2  SpO2 96%  Body mass index is 26.11 kg/(m^2).   No ankle edema  ASSESSMENT/ PLAN::   Diabetes type 2   The patient's diabetes control appears to be overall very well controlled and most of his readings are fairly good at home He is usually compliant with diet and exercise Discussed needing to check more glucose after meals He will try to work on maintaining his weight or losing 5-10 pounds since his  BMI is relatively high for a [place]  Patient To have A1c checked in 4 months  Preventive care: Has had regular eye and foot exams  HYPERTENSION: Blood pressure is excellent, no orthostatic symptoms  Renal dysfunction: Has upper normal creatinine which is stable and probably age related  Hypothyroidism: TSH normal, He will continue the same dosage  Hypercholesterolemia: LDL at goal. No change in medication  Anemia: He will continue iron and B12 and followup with PCP for repeat CBC   Elayne Snare 04/18/2013, 3:24 PM

## 2013-04-18 NOTE — Patient Instructions (Addendum)
Vitamin B12, 3 times a week is ok  Please check blood sugars at least half the time about 2 hours after any meal and as directed on waking up. Please bring blood sugar monitor to each visit  Appointment on 04/14/2013  Component Date Value Ref Range Status  . Cholesterol 04/14/2013 160  0 - 200 mg/dL Final   ATP III Classification       Desirable:  < 200 mg/dL               Borderline High:  200 - 239 mg/dL          High:  > = 240 mg/dL  . Triglycerides 04/14/2013 75.0  0.0 - 149.0 mg/dL Final   Normal:  <150 mg/dLBorderline High:  150 - 199 mg/dL  . HDL 04/14/2013 51.30  >39.00 mg/dL Final  . VLDL 04/14/2013 15.0  0.0 - 40.0 mg/dL Final  . LDL Cholesterol 04/14/2013 94  0 - 99 mg/dL Final  . Total CHOL/HDL Ratio 04/14/2013 3   Final                  Men          Women1/2 Average Risk     3.4          3.3Average Risk          5.0          4.42X Average Risk          9.6          7.13X Average Risk          15.0          11.0                      Appointment on 04/14/2013  Component Date Value Ref Range Status  . Free T4 04/14/2013 1.15  0.60 - 1.60 ng/dL Final  . TSH 04/14/2013 0.63  0.35 - 5.50 uIU/mL Final  . Iron 04/14/2013 89  42 - 165 ug/dL Final  . Transferrin 04/14/2013 307.1  212.0 - 360.0 mg/dL Final  . Saturation Ratios 04/14/2013 20.7  20.0 - 50.0 % Final  . Vitamin B-12 04/14/2013 1165* 211 - 911 pg/mL Final

## 2013-04-20 DIAGNOSIS — E7800 Pure hypercholesterolemia, unspecified: Secondary | ICD-10-CM | POA: Insufficient documentation

## 2013-06-29 ENCOUNTER — Other Ambulatory Visit: Payer: Self-pay | Admitting: Endocrinology

## 2013-07-02 ENCOUNTER — Other Ambulatory Visit: Payer: Self-pay | Admitting: *Deleted

## 2013-07-02 MED ORDER — LEVOTHYROXINE SODIUM 75 MCG PO TABS: 75.0000 ug | ORAL_TABLET | Freq: Every day | ORAL | Status: DC

## 2013-07-20 ENCOUNTER — Other Ambulatory Visit: Payer: Self-pay | Admitting: Endocrinology

## 2013-07-21 ENCOUNTER — Other Ambulatory Visit: Payer: Self-pay | Admitting: *Deleted

## 2013-07-21 MED ORDER — LOSARTAN POTASSIUM 50 MG PO TABS: 50.0000 mg | ORAL_TABLET | Freq: Every day | ORAL | Status: DC

## 2013-07-21 MED ORDER — PIOGLITAZONE HCL 30 MG PO TABS: 30.0000 mg | ORAL_TABLET | Freq: Every day | ORAL | Status: DC

## 2013-08-13 ENCOUNTER — Other Ambulatory Visit: Payer: Medicare Other

## 2013-08-18 ENCOUNTER — Ambulatory Visit: Payer: Medicare Other | Admitting: Endocrinology

## 2013-09-03 IMAGING — CT CT HEAD W/O CM
1 of 2 series · 13 of 30 positions shown, 17 images · non-contrast
Comparison: CT 06/19/2011

CLINICAL DATA: Follow-up subdural hematoma

CT HEAD WITHOUT CONTRAST
TECHNIQUE: Contiguous axial images were obtained from the base of
the skull through the vertex without contrast.

[Series 2: brain · axial · 0.47mm/px · z∈[+141,+271]mm · 13 of 32 slices shown, 17 images]
[im 3/32  brain]
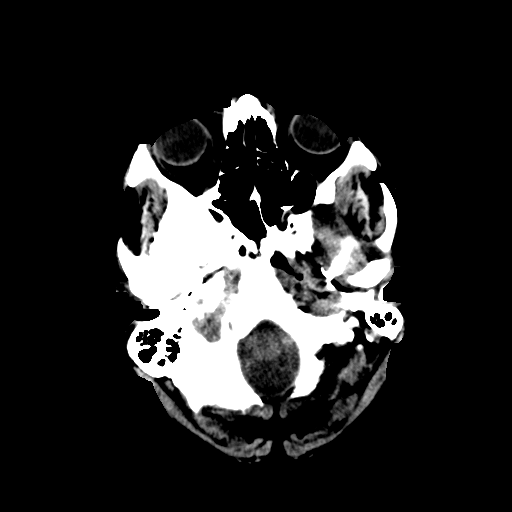
[im 3/32  bone]
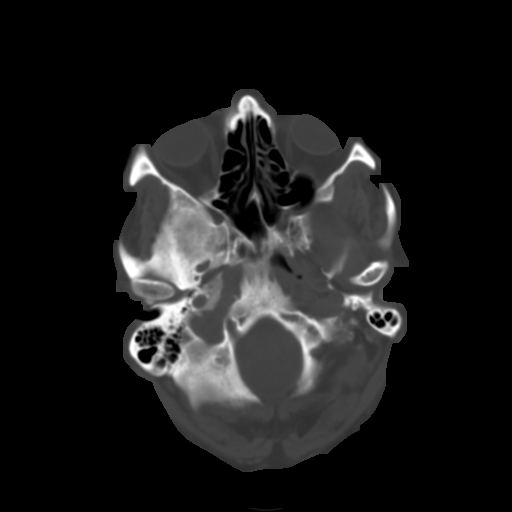
[im 5/32  brain]
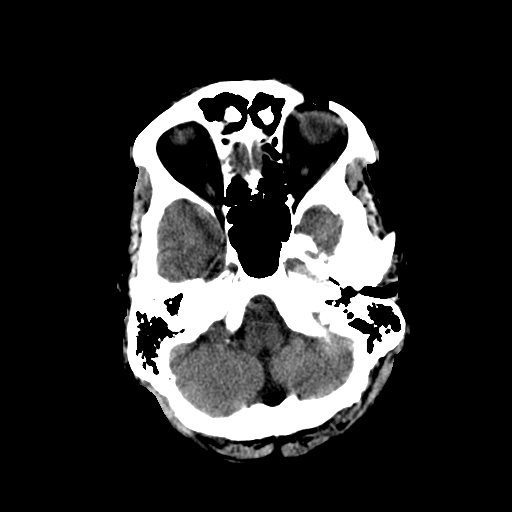
[im 7/32  brain]
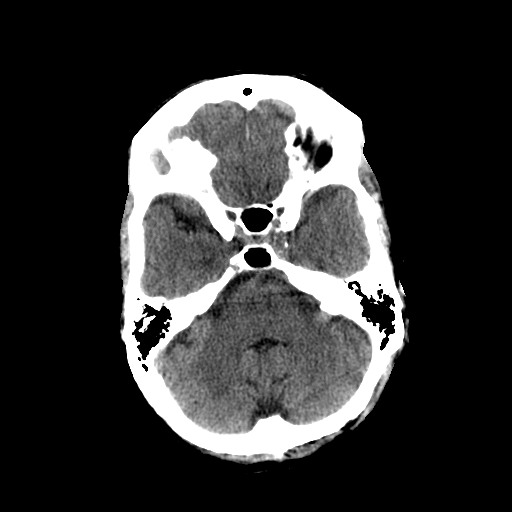
[im 9/32  brain]
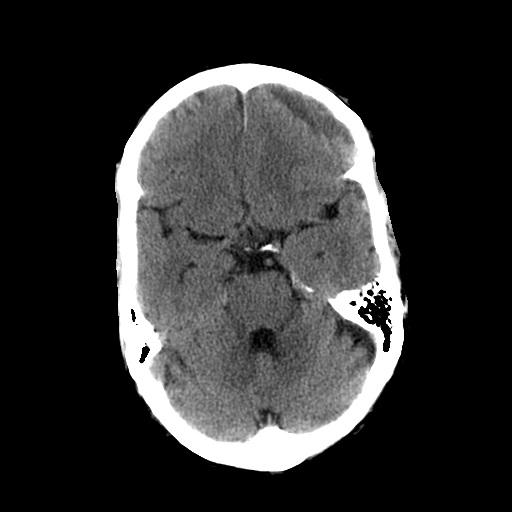
[im 12/32  brain]
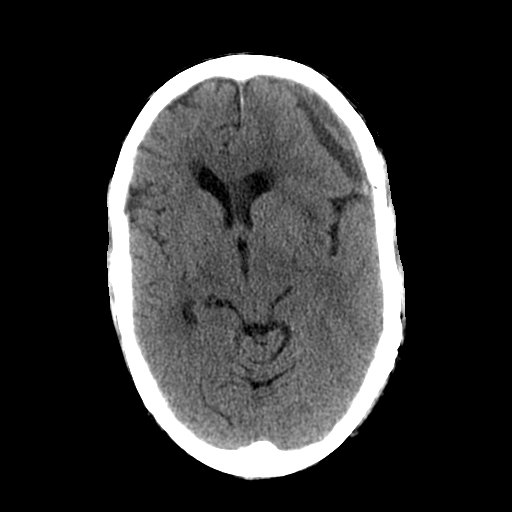
[im 12/32  bone]
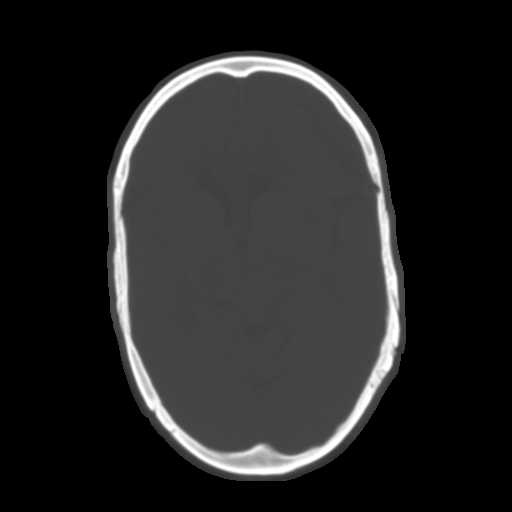
[im 14/32  brain]
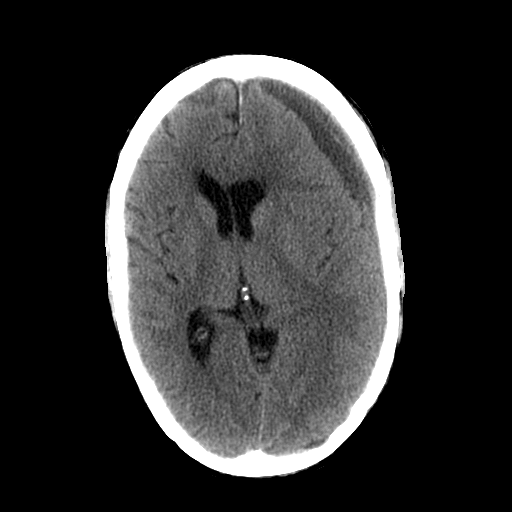
[im 16/32  brain]
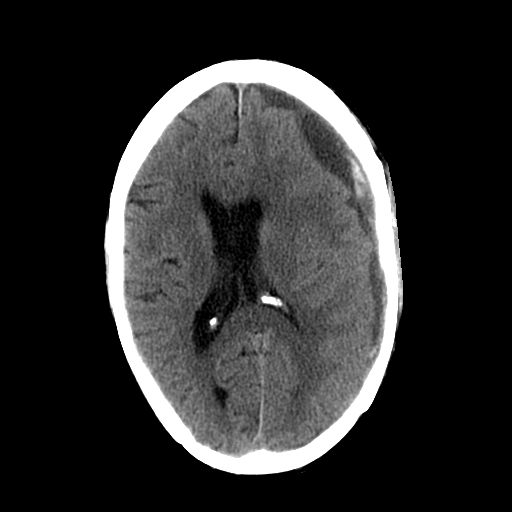
[im 18/32  brain]
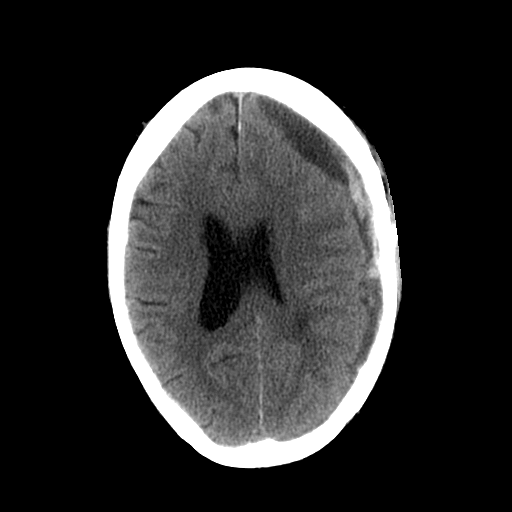
[im 20/32  brain]
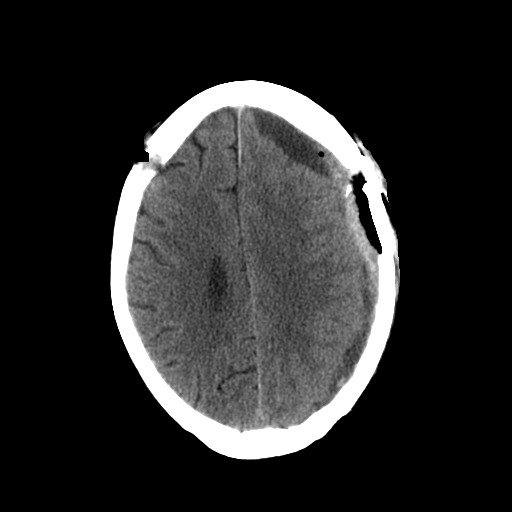
[im 20/32  bone]
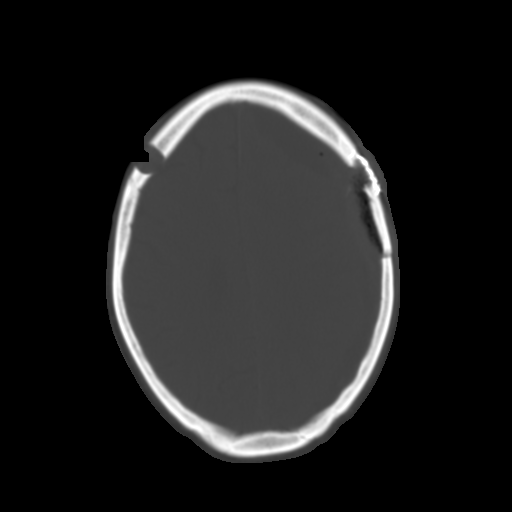
[im 23/32  brain]
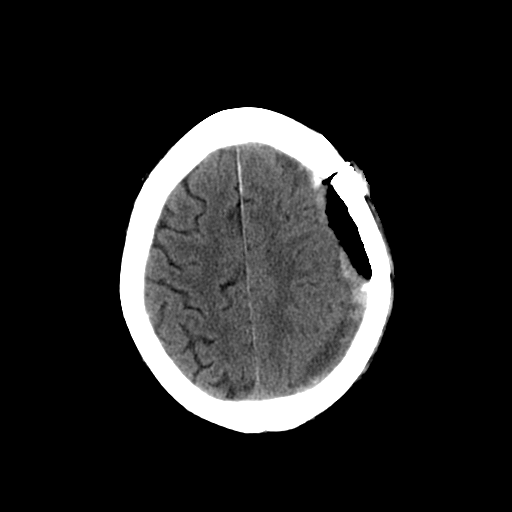
[im 25/32  brain]
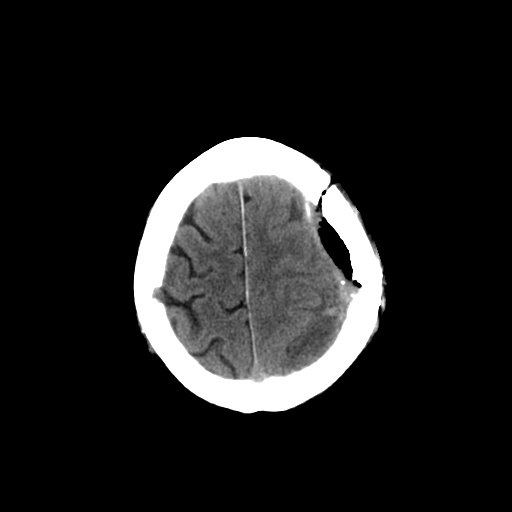
[im 27/32  brain]
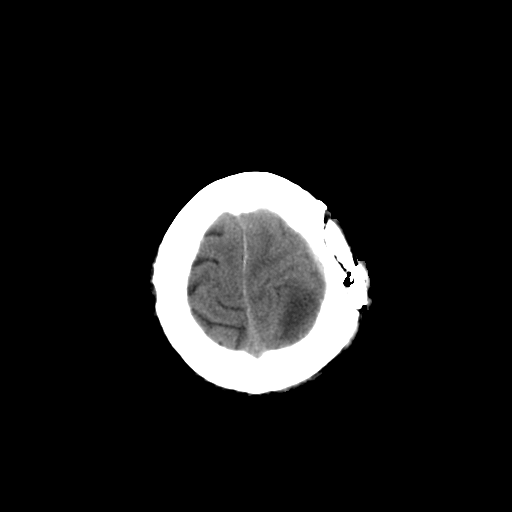
[im 29/32  brain]
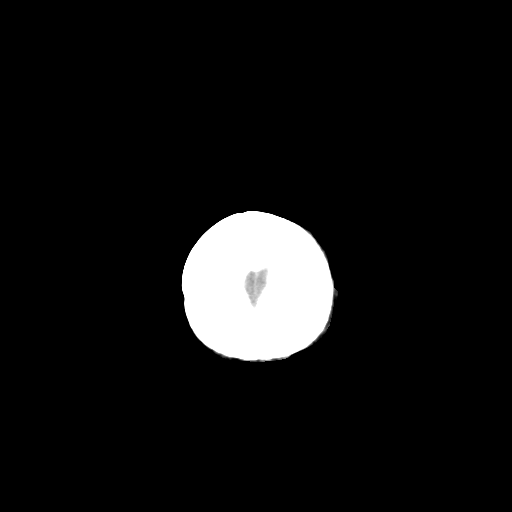
[im 29/32  bone]
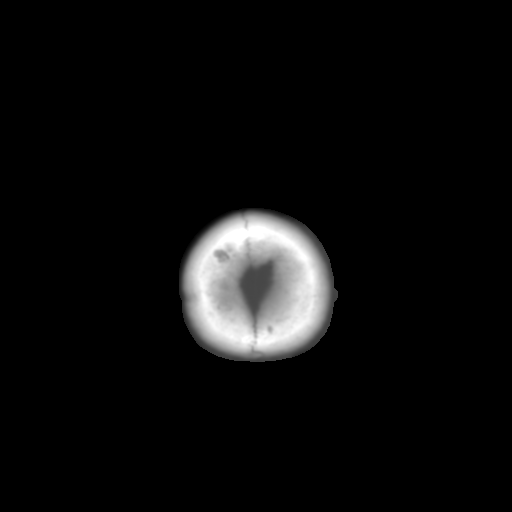

[13 of 30 positions shown; findings below may reference images not displayed]

FINDINGS: Mixed density subdural hematoma left frontal and parietal
lobes is similar to the prior study.  This measures 16 mm in
thickness in the frontal region and 8 mm in the parietal region.
There is gas in the subdural space related to recent craniotomy.

Midline shift measures 8 mm and is unchanged from the prior study.
No new area of hemorrhage or infarction is identified.  Ventricles
are not enlarged.
IMPRESSION: Moderately large mixed density subdural hematoma on the left is
unchanged.  8 mm midline shift is unchanged.

## 2013-09-03 IMAGING — CT CT HEAD W/O CM
1 of 2 series · 13 of 30 positions shown, 17 images · non-contrast
Comparison: Head CT scan 06/21/2011 at the [DATE] a.m.

CLINICAL DATA: Episode of aphasia.

CT HEAD WITHOUT CONTRAST
TECHNIQUE: Contiguous axial images were obtained from the base of
the skull through the vertex without contrast.

[Series 2: brain · axial · 0.49mm/px · z∈[+132,+263]mm · 13 of 32 slices shown, 17 images]
[im 3/32  brain]
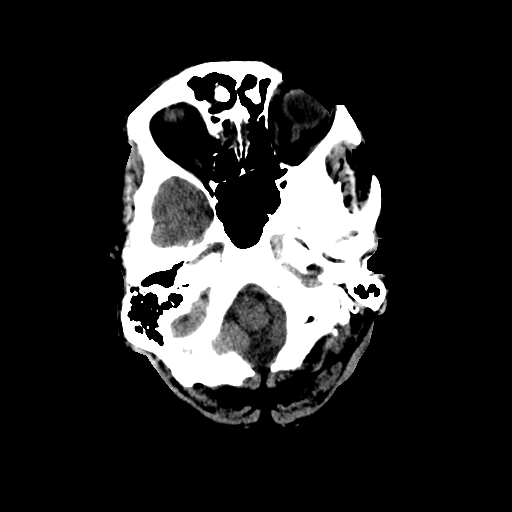
[im 3/32  bone]
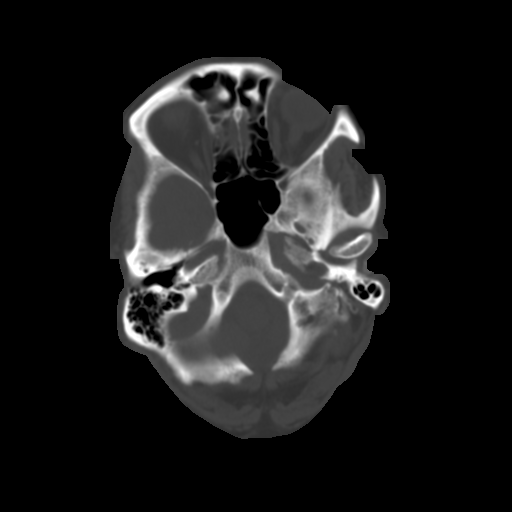
[im 5/32  brain]
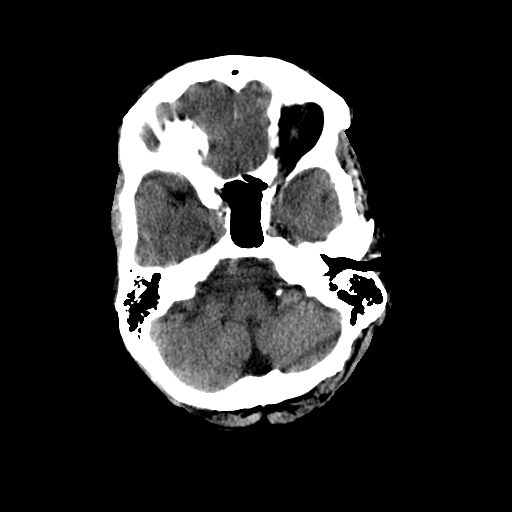
[im 7/32  brain]
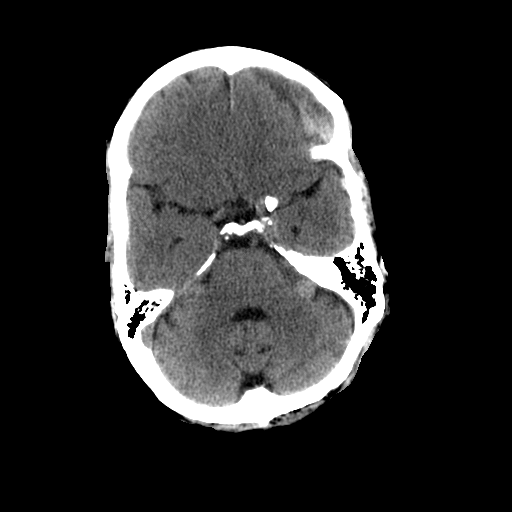
[im 9/32  brain]
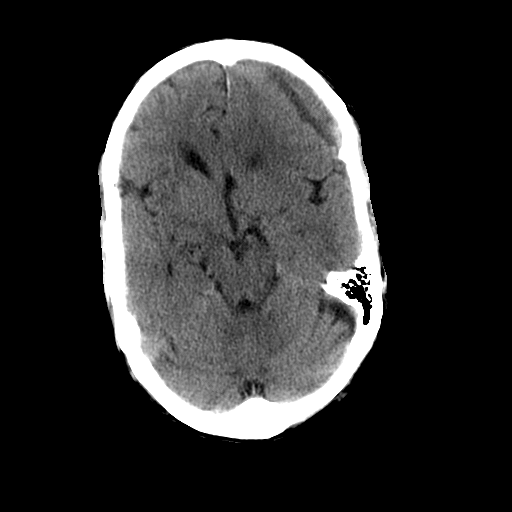
[im 12/32  brain]
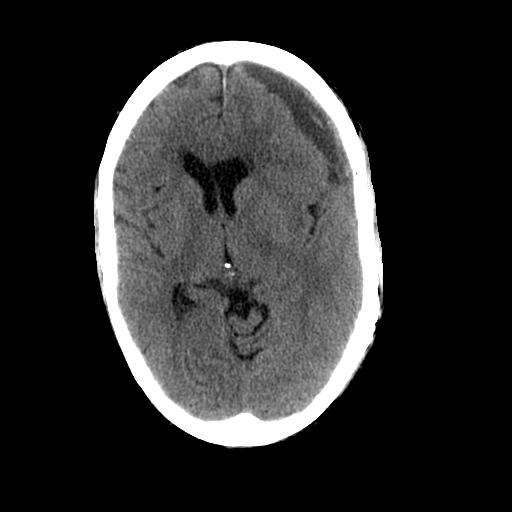
[im 12/32  bone]
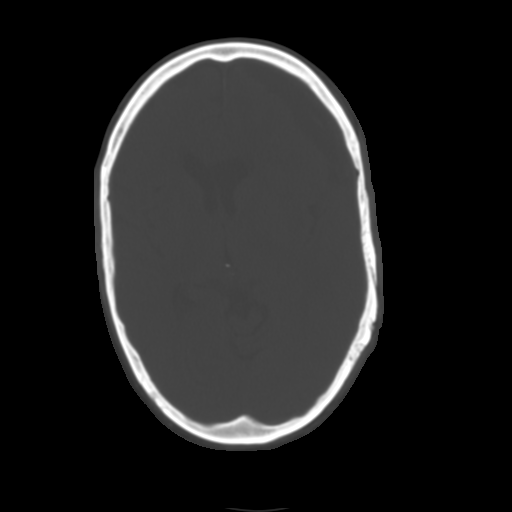
[im 14/32  brain]
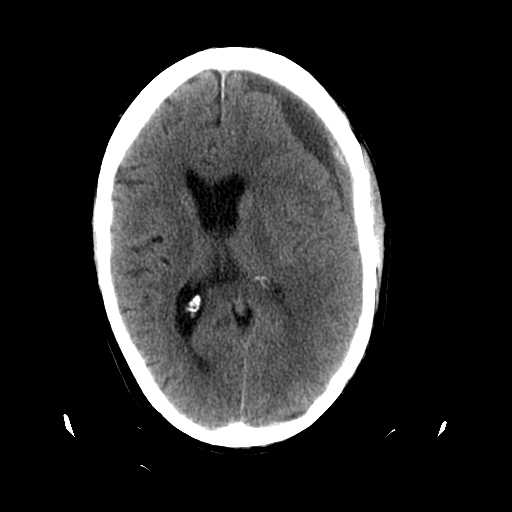
[im 16/32  brain]
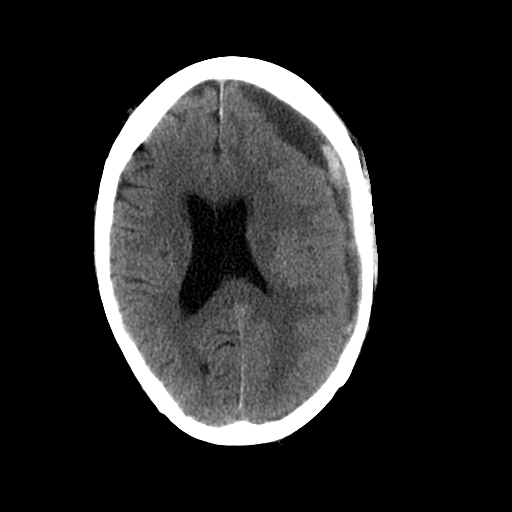
[im 18/32  brain]
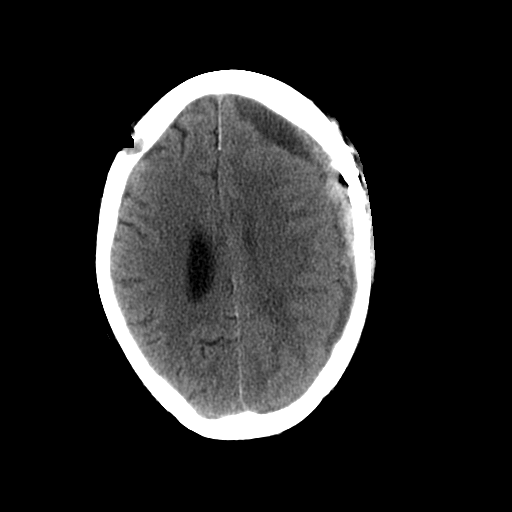
[im 20/32  brain]
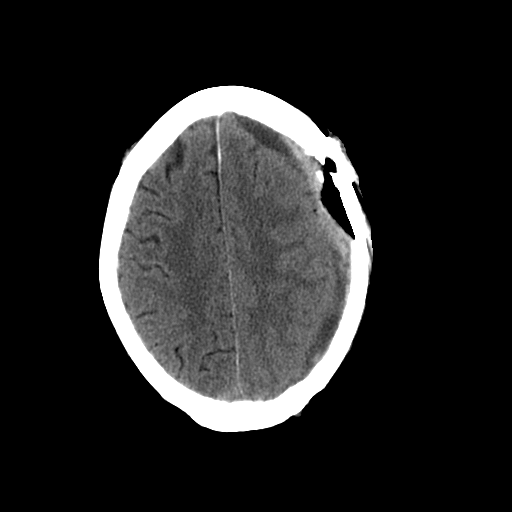
[im 20/32  bone]
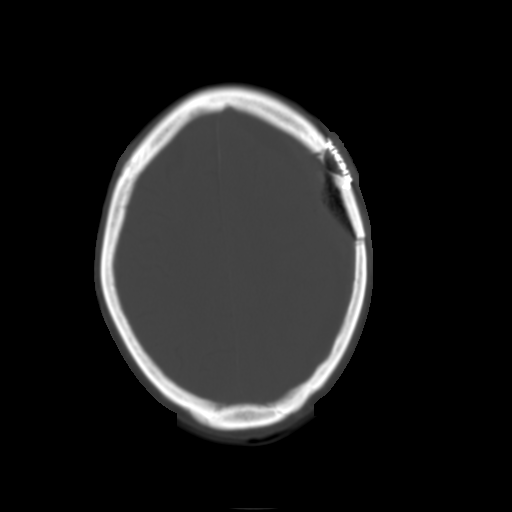
[im 23/32  brain]
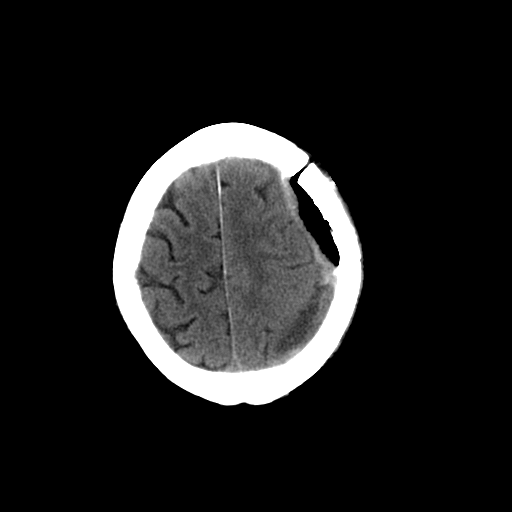
[im 25/32  brain]
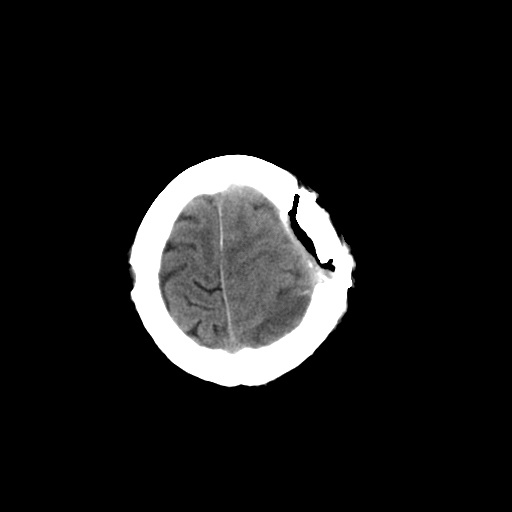
[im 27/32  brain]
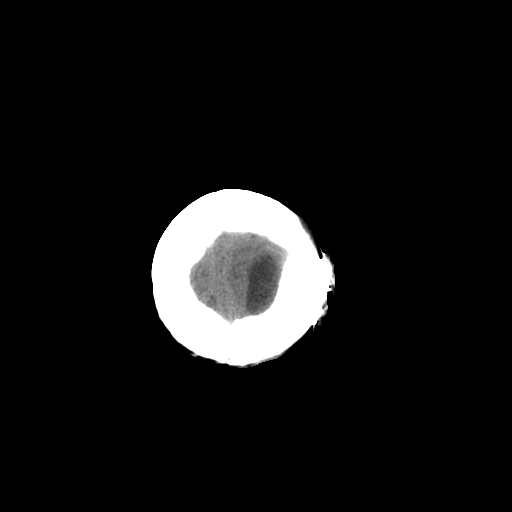
[im 29/32  brain]
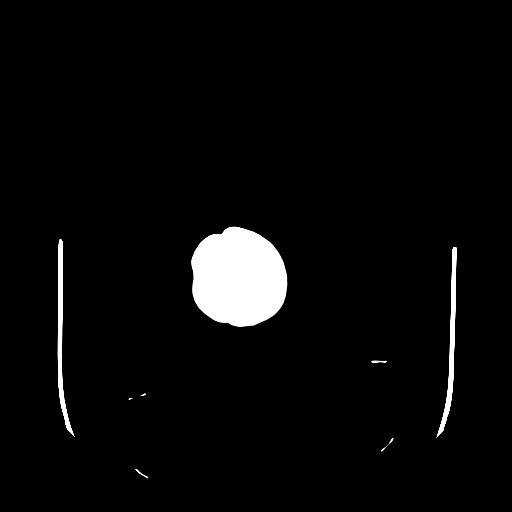
[im 29/32  bone]
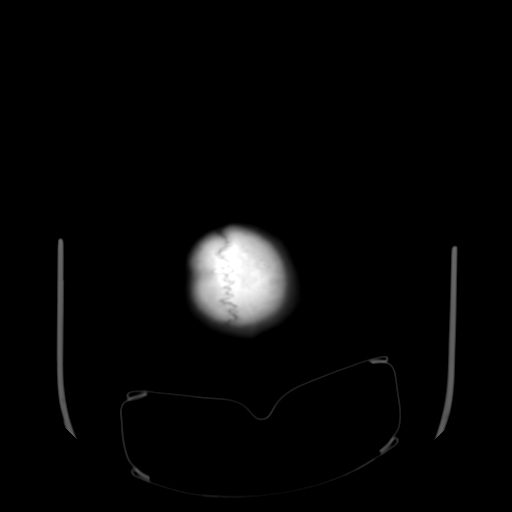

[13 of 30 positions shown; findings below may reference images not displayed]

FINDINGS: Left craniotomy defect is again seen.  Acute on chronic
subdural hemorrhage over the left convexities is not markedly
changed in appearance measuring 1.6 cm in thickness.  Left right
midline shift of 0.8 cm is unchanged.  No new hemorrhage is
identified.  No evidence of acute infarction is seen.  Burr holes
on the right again seen.
IMPRESSION: Acute on chronic left subdural hemorrhage is unchanged in
appearance with 0.8 cm of left right midline shift, also unchanged.
No new abnormality.

## 2013-09-26 DIAGNOSIS — Z23 Encounter for immunization: Secondary | ICD-10-CM | POA: Diagnosis not present

## 2013-09-26 DIAGNOSIS — R52 Pain, unspecified: Secondary | ICD-10-CM | POA: Diagnosis not present

## 2013-09-26 DIAGNOSIS — R51 Headache: Secondary | ICD-10-CM | POA: Diagnosis not present

## 2013-10-03 ENCOUNTER — Telehealth: Payer: Self-pay | Admitting: Endocrinology

## 2013-10-03 NOTE — Telephone Encounter (Signed)
Can we add the iron level, b12 level and hemoglobin level at the lab appt 10/17/13

## 2013-10-06 NOTE — Telephone Encounter (Signed)
Please see below and advise.

## 2013-10-07 ENCOUNTER — Other Ambulatory Visit: Payer: Self-pay | Admitting: *Deleted

## 2013-10-07 DIAGNOSIS — D509 Iron deficiency anemia, unspecified: Secondary | ICD-10-CM

## 2013-10-07 DIAGNOSIS — D649 Anemia, unspecified: Secondary | ICD-10-CM

## 2013-10-07 DIAGNOSIS — E538 Deficiency of other specified B group vitamins: Secondary | ICD-10-CM

## 2013-10-07 NOTE — Telephone Encounter (Signed)
Labs ordered.

## 2013-10-07 NOTE — Telephone Encounter (Signed)
CBC, iron panel and vitamin B 12 level, diagnoses iron deficiency anemia and B12 deficiency

## 2013-10-21 ENCOUNTER — Other Ambulatory Visit (INDEPENDENT_AMBULATORY_CARE_PROVIDER_SITE_OTHER): Payer: Medicare Other

## 2013-10-21 DIAGNOSIS — D649 Anemia, unspecified: Secondary | ICD-10-CM

## 2013-10-21 DIAGNOSIS — D509 Iron deficiency anemia, unspecified: Secondary | ICD-10-CM

## 2013-10-21 DIAGNOSIS — IMO0002 Reserved for concepts with insufficient information to code with codable children: Secondary | ICD-10-CM

## 2013-10-21 DIAGNOSIS — E039 Hypothyroidism, unspecified: Secondary | ICD-10-CM | POA: Diagnosis not present

## 2013-10-21 DIAGNOSIS — E1165 Type 2 diabetes mellitus with hyperglycemia: Secondary | ICD-10-CM

## 2013-10-21 DIAGNOSIS — E538 Deficiency of other specified B group vitamins: Secondary | ICD-10-CM | POA: Diagnosis not present

## 2013-10-21 LAB — COMPREHENSIVE METABOLIC PANEL
ALT: 17 U/L (ref 0–53)
AST: 25 U/L (ref 0–37)
Albumin: 3.6 g/dL (ref 3.5–5.2)
Alkaline Phosphatase: 55 U/L (ref 39–117)
BUN: 24 mg/dL — ABNORMAL HIGH (ref 6–23)
CO2: 19 mEq/L (ref 19–32)
Calcium: 9.1 mg/dL (ref 8.4–10.5)
Chloride: 105 mEq/L (ref 96–112)
Creatinine, Ser: 1.3 mg/dL (ref 0.4–1.5)
GFR: 56.14 mL/min — ABNORMAL LOW (ref >60.00)
Glucose, Bld: 121 mg/dL — ABNORMAL HIGH (ref 70–99)
Potassium: 4.3 mEq/L (ref 3.5–5.1)
Sodium: 137 mEq/L (ref 135–145)
Total Bilirubin: 0.5 mg/dL (ref 0.2–1.2)
Total Protein: 7 g/dL (ref 6.0–8.3)

## 2013-10-21 LAB — URINALYSIS, ROUTINE W REFLEX MICROSCOPIC
Bilirubin Urine: NEGATIVE
Hgb urine dipstick: NEGATIVE
Ketones, ur: NEGATIVE
Leukocytes, UA: NEGATIVE
Nitrite: NEGATIVE
Specific Gravity, Urine: 1.02 (ref 1.000–1.030)
Total Protein, Urine: NEGATIVE
Urine Glucose: NEGATIVE
Urobilinogen, UA: 0.2 (ref 0.0–1.0)
pH: 5.5 (ref 5.0–8.0)

## 2013-10-21 LAB — CBC
HCT: 37.3 % — ABNORMAL LOW (ref 39.0–52.0)
Hemoglobin: 11.8 g/dL — ABNORMAL LOW (ref 13.0–17.0)
MCHC: 31.7 g/dL (ref 30.0–36.0)
MCV: 92.4 fl (ref 78.0–100.0)
Platelets: 178 10*3/uL (ref 150.0–400.0)
RBC: 4.03 Mil/uL — ABNORMAL LOW (ref 4.22–5.81)
RDW: 14.1 % (ref 11.5–15.5)
WBC: 5.7 10*3/uL (ref 4.0–10.5)

## 2013-10-21 LAB — TSH: TSH: 0.36 u[IU]/mL (ref 0.35–4.50)

## 2013-10-21 LAB — VITAMIN B12: Vitamin B-12: 481 pg/mL (ref 211–911)

## 2013-10-21 LAB — HEMOGLOBIN A1C: Hgb A1c MFr Bld: 6.4 % (ref 4.6–6.5)

## 2013-10-21 LAB — IRON: Iron: 81 ug/dL (ref 42–165)

## 2013-10-24 ENCOUNTER — Ambulatory Visit (INDEPENDENT_AMBULATORY_CARE_PROVIDER_SITE_OTHER): Payer: Medicare Other | Admitting: Endocrinology

## 2013-10-24 ENCOUNTER — Encounter: Payer: Self-pay | Admitting: Endocrinology

## 2013-10-24 ENCOUNTER — Other Ambulatory Visit: Payer: Self-pay

## 2013-10-24 VITALS — BP 127/76 | HR 81 | Temp 98.1°F | Resp 16 | Ht 70.0 in | Wt 177.2 lb

## 2013-10-24 DIAGNOSIS — E039 Hypothyroidism, unspecified: Secondary | ICD-10-CM

## 2013-10-24 DIAGNOSIS — D508 Other iron deficiency anemias: Secondary | ICD-10-CM

## 2013-10-24 DIAGNOSIS — E119 Type 2 diabetes mellitus without complications: Secondary | ICD-10-CM | POA: Diagnosis not present

## 2013-10-24 NOTE — Patient Instructions (Signed)
Synthroid 1 daily except 1x per week take 1/2 tab  Please check blood sugars at least half the time about 2 hours after any meal and times per week on waking up. Please bring blood sugar monitor to each visit

## 2013-10-24 NOTE — Progress Notes (Signed)
Patient ID: James Juarez, male   DOB: [birth year], 73 y.o.   MRN: [id number]   Reason for Appointment: Diabetes follow-up   History of Present Illness   Diagnosis: Type 2 DIABETES MELITUS  He has had long-standing diabetes which has been well-controlled with a 4 drug regimen which has been unchanged for a few years He is usually is fairly compliant with his diet and exercise regimen  Usually has some weight loss over summer A1c is usually upper normal and is stable at 6.4 now He has checked blood sugars at home but mostly in the mornings and relatively infrequently Blood sugars are excellent at home and he has no hypoglycemia with taking his glipizide ER     Oral hypoglycemic drugs: Glipizide ER, Actos and Janumet        Side effects from medications: None Proper timing of medications in relation to meals: Yes.         Monitors blood glucose: Once a day.    Glucometer: ? Accu-Chek     Blood Glucose readings from meter download:  30 day average is 109, fasting = usually less than 100, once 120, postprandial 111, 142  Meals: 3 meals per day.          Physical activity: exercise: Walking 30  min, 5/7 days a week, more recently             Wt Readings from Last 3 Encounters:  10/24/13 177 lb 3.2 oz (80.377 kg)  04/18/13 182 lb (82.555 kg)  08/21/12 176 lb 9.6 oz (80.105 kg)   LABS:  Lab Results  Component Value Date   HGBA1C 6.4 10/21/2013   HGBA1C 6.3 08/19/2012   Lab Results  Component Value Date   MICROALBUR 0.2 08/19/2012   [place] 94 04/14/2013   CREATININE 1.3 10/21/2013     Office Visit on 10/24/2013  Component Date Value Ref Range Status  . HM Diabetic Eye Exam 04/15/2013 No Retinopathy  No Retinopathy Final  Appointment on 10/21/2013  Component Date Value Ref Range Status  . Color, Urine 10/21/2013 YELLOW  Yellow;Lt. Yellow Final  . APPearance 10/21/2013 CLEAR  Clear Final  . Specific Gravity, Urine 10/21/2013 1.020  1.000-1.030 Final  . pH 10/21/2013 5.5  5.0 - 8.0  Final  . Total Protein, Urine 10/21/2013 NEGATIVE  Negative Final  . Urine Glucose 10/21/2013 NEGATIVE  Negative Final  . Ketones, ur 10/21/2013 NEGATIVE  Negative Final  . Bilirubin Urine 10/21/2013 NEGATIVE  Negative Final  . Hgb urine dipstick 10/21/2013 NEGATIVE  Negative Final  . Urobilinogen, UA 10/21/2013 0.2  0.0 - 1.0 Final  . Leukocytes, UA 10/21/2013 NEGATIVE  Negative Final  . Nitrite 10/21/2013 NEGATIVE  Negative Final  . WBC, UA 10/21/2013 0-2/hpf  0-2/hpf Final  . RBC / HPF 10/21/2013 0-2/hpf  0-2/hpf Final  . Squamous Epithelial / LPF 10/21/2013 Rare(0-4/hpf)  Rare(0-4/hpf) Final  . TSH 10/21/2013 0.36  0.35 - 4.50 uIU/mL Final  . WBC 10/21/2013 5.7  4.0 - 10.5 K/uL Final  . RBC 10/21/2013 4.03* 4.22 - 5.81 Mil/uL Final  . Platelets 10/21/2013 178.0  150.0 - 400.0 K/uL Final  . Hemoglobin 10/21/2013 11.8* 13.0 - 17.0 g/dL Final  . HCT 10/21/2013 37.3* 39.0 - 52.0 % Final  . MCV 10/21/2013 92.4  78.0 - 100.0 fl Final  . MCHC 10/21/2013 31.7  30.0 - 36.0 g/dL Final  . RDW 10/21/2013 14.1  11.5 - 15.5 % Final  . Iron 10/21/2013 81  42 - 165  ug/dL Final  . Vitamin B-12 10/21/2013 481  211 - 911 pg/mL Final  . Hemoglobin A1C 10/21/2013 6.4  4.6 - 6.5 % Final   Glycemic Control Guidelines for People with Diabetes:Non Diabetic:  <6%Goal of Therapy: <7%Additional Action Suggested:  >8%   . Sodium 10/21/2013 137  135 - 145 mEq/L Final  . Potassium 10/21/2013 4.3  3.5 - 5.1 mEq/L Final  . Chloride 10/21/2013 105  96 - 112 mEq/L Final  . CO2 10/21/2013 19  19 - 32 mEq/L Final  . Glucose, Bld 10/21/2013 121* 70 - 99 mg/dL Final  . BUN 10/21/2013 24* 6 - 23 mg/dL Final  . Creatinine, Ser 10/21/2013 1.3  0.4 - 1.5 mg/dL Final  . Total Bilirubin 10/21/2013 0.5  0.2 - 1.2 mg/dL Final  . Alkaline Phosphatase 10/21/2013 55  39 - 117 U/L Final  . AST 10/21/2013 25  0 - 37 U/L Final  . ALT 10/21/2013 17  0 - 53 U/L Final  . Total Protein 10/21/2013 7.0  6.0 - 8.3 g/dL Final  .  Albumin 10/21/2013 3.6  3.5 - 5.2 g/dL Final  . Calcium 10/21/2013 9.1  8.4 - 10.5 mg/dL Final  . GFR 10/21/2013 56.14* >60.00 mL/min Final      Medication List       This list is accurate as of: 10/24/13  1:09 PM.  Always use your most recent med list.               ACCU-CHEK FASTCLIX LANCETS Misc  1 each by Does not apply route 2 (two) times daily. Dx code 250.00     acetaminophen 325 MG tablet  Commonly known as:  TYLENOL  Take 325 mg by mouth every 6 (six) hours as needed. For pain     albuterol 108 (90 BASE) MCG/ACT inhaler  Commonly known as:  PROVENTIL HFA;VENTOLIN HFA  Inhale 2 puffs into the lungs every 6 (six) hours as needed. For shortness of breath     clobetasol 0.05 % Gel  Commonly known as:  TEMOVATE  Apply 1 application topically daily as needed. For face.     CRESTOR 5 MG tablet  Generic drug:  rosuvastatin  TAKE 1 TABLET BY MOUTH EVERY DAY     fexofenadine 180 MG tablet  Commonly known as:  ALLEGRA  Take 180 mg by mouth daily as needed. For allergies     glipiZIDE 5 MG 24 hr tablet  Commonly known as:  GLUCOTROL XL  TAKE 1 TABLET BY MOUTH EVERY DAY     glucose blood test strip  Commonly known as:  ACCU-CHEK SMARTVIEW  Use as instructed to check blood sugars two times a day, Dx code 250.00     hydroxypropyl methylcellulose / hypromellose 2.5 % ophthalmic solution  Commonly known as:  ISOPTO TEARS / GONIOVISC  Place 1 drop into both eyes every other day.     levothyroxine 75 MCG tablet  Commonly known as:  SYNTHROID, LEVOTHROID  Take 1 tablet (75 mcg total) by mouth daily before breakfast.     losartan 50 MG tablet  Commonly known as:  COZAAR  Take 1 tablet (50 mg total) by mouth daily.     multivitamin with minerals Tabs tablet  Take 1 tablet by mouth daily.     pioglitazone 30 MG tablet  Commonly known as:  ACTOS  Take 1 tablet (30 mg total) by mouth daily.     sitaGLIPtin-metformin 50-1000 MG per tablet  Commonly known as:  JANUMET   Take 1 tablet by mouth 2 (two) times daily with a meal.        Allergies: No Known Allergies  Past Medical History  Diagnosis Date  . Diabetes mellitus   . Hypertension   . Asthma   . Diverticular disease   . Subdural hematoma May 2013    bilateral     Past Surgical History  Procedure Laterality Date  . Hernia repair    . Burr hole  05/22/2011    Procedure: Haskell Flirt;  Surgeon: Elaina Hoops, MD;  Location: [place] [facility];  Service: Neurosurgery;  Laterality: Right;  Right Burr Holes for evacuation of subdural hematoma  . Burr hole  05/23/2011    Procedure: Haskell Flirt;  Surgeon: Elaina Hoops, MD;  Location: [place] [facility];  Service: Neurosurgery;  Laterality: Left;  Left [facility]  . Craniotomy  06/14/2011    Procedure: CRANIOTOMY HEMATOMA EVACUATION SUBDURAL;  Surgeon: Elaina Hoops, MD;  Location: [place] [facility];  Service: Neurosurgery;  Laterality: N/A;  Left Craniotomy for subdural hematoma  . Craniotomy  06/23/2011    Procedure: CRANIOTOMY HEMATOMA EVACUATION SUBDURAL;  Surgeon: Elaina Hoops, MD;  Location: [place] [facility];  Service: Neurosurgery;  Laterality: Left;  Redo Craniotomy for Subdural Hematoma    No family history on file.  Social History:  reports that he has quit smoking. He quit smokeless tobacco use about 35 years ago. He reports that he does not drink alcohol or use illicit drugs.  Review of Systems:  HYPERTENSION:  he has had hypertension for several years   Currently on 50 mg losartan with good control  HYPERLIPIDEMIA: The lipid abnormality consists of elevated LDL, well controlled with 5 mg Crestor  Lab Results  Component Value Date   CHOL 160 04/14/2013   HDL 51.30 04/14/2013   LDLCALC 94 04/14/2013   TRIG 75.0 04/14/2013   CHOLHDL 3 04/14/2013    HYPOTHYROIDISM: he has had long-standing mild hypothyroidism since 1995 and since then has been on 75 mcg No previous history of goiter TSH level is low normal now   Lab Results  Component Value Date   TSH 0.36  10/21/2013    No symptoms of numbness in his feet. Diabetic foot exam done in 10/15  He has had anemia which is multifactorial and has been given iron by PCP.   Still is taking his B12 supplement for pernicious anemia Hemoglobin is slightly low, iron and B12 are normal  Lab Results  Component Value Date   WBC 5.7 10/21/2013   HGB 11.8* 10/21/2013   HCT 37.3* 10/21/2013   MCV 92.4 10/21/2013   PLT 178.0 10/21/2013     PRIMARY HYPOGONADISM:  He has minimal symptoms and his diagnosis was made in 2/14 with a free T level of 4.8, increased LH of 33 He was not interested in medications as he was asymptomatic     Examination:   BP 127/76  Pulse 81  Temp(Src) 98.1 F (36.7 C)  Resp 16  Ht 5\' 10"  (1.778 m)  Wt 177 lb 3.2 oz (80.377 kg)  BMI 25.43 kg/m2  Body mass index is 25.43 kg/(m^2).   No ankle edema Diabetic foot exam shows normal monofilament sensation in the toes and plantar surfaces, no skin lesions or ulcers on the feet and normal pedal pulses  He has a fullness on the right lobe of the thyroid but no distinct nodules felt  ASSESSMENT/ PLAN:   Diabetes type 2  The patient's diabetes control appears to be overall  well controlled with upper normal A1c His readings are fairly good at home although checking mostly in the morning He is usually compliant with diet and exercise Discussed needing to check more glucose after meals He will continue the same regimen including glipizide ER To have A1c checked in 6 months on followup  Preventive care: Has had regular eye and foot exams  HYPERTENSION: Blood pressure is well-controlled   Renal dysfunction: This is very mild and Has upper normal creatinine which is stable    Hypothyroidism: TSH low normal, He will reduced the dose to half tablet once a week and continue 1 tablet once 6 days The diagnosis is somewhat questionable since he is requiring only a low dose since it was first diagnosed and TSH is low normal  now  Anemia: He is on iron and B12 and followup with PCP for followup since hemoglobin is not quite normal, discussed  Total visit time = 25 minutes  Keili Hasten 10/24/2013, 1:09 PM

## 2013-11-26 DIAGNOSIS — E785 Hyperlipidemia, unspecified: Secondary | ICD-10-CM | POA: Diagnosis not present

## 2013-11-26 DIAGNOSIS — E119 Type 2 diabetes mellitus without complications: Secondary | ICD-10-CM | POA: Diagnosis not present

## 2013-11-26 DIAGNOSIS — E039 Hypothyroidism, unspecified: Secondary | ICD-10-CM | POA: Diagnosis not present

## 2013-11-26 DIAGNOSIS — Z79899 Other long term (current) drug therapy: Secondary | ICD-10-CM | POA: Diagnosis not present

## 2013-11-26 DIAGNOSIS — Z0001 Encounter for general adult medical examination with abnormal findings: Secondary | ICD-10-CM | POA: Diagnosis not present

## 2013-11-26 DIAGNOSIS — D649 Anemia, unspecified: Secondary | ICD-10-CM | POA: Diagnosis not present

## 2013-11-26 DIAGNOSIS — Z125 Encounter for screening for malignant neoplasm of prostate: Secondary | ICD-10-CM | POA: Diagnosis not present

## 2013-11-26 DIAGNOSIS — I10 Essential (primary) hypertension: Secondary | ICD-10-CM | POA: Diagnosis not present

## 2013-11-26 DIAGNOSIS — Z23 Encounter for immunization: Secondary | ICD-10-CM | POA: Diagnosis not present

## 2013-11-27 ENCOUNTER — Other Ambulatory Visit: Payer: Self-pay | Admitting: *Deleted

## 2013-11-27 MED ORDER — SITAGLIPTIN PHOS-METFORMIN HCL 50-1000 MG PO TABS: 1.0000 | ORAL_TABLET | Freq: Two times a day (BID) | ORAL | Status: DC

## 2013-12-25 ENCOUNTER — Other Ambulatory Visit: Payer: Self-pay | Admitting: Endocrinology

## 2013-12-26 ENCOUNTER — Other Ambulatory Visit: Payer: Self-pay | Admitting: Endocrinology

## 2013-12-30 ENCOUNTER — Other Ambulatory Visit: Payer: Self-pay | Admitting: Endocrinology

## 2014-01-15 ENCOUNTER — Other Ambulatory Visit: Payer: Self-pay | Admitting: Endocrinology

## 2014-03-23 DIAGNOSIS — L438 Other lichen planus: Secondary | ICD-10-CM | POA: Diagnosis not present

## 2014-03-23 DIAGNOSIS — J4520 Mild intermittent asthma, uncomplicated: Secondary | ICD-10-CM | POA: Diagnosis not present

## 2014-03-23 DIAGNOSIS — H9202 Otalgia, left ear: Secondary | ICD-10-CM | POA: Diagnosis not present

## 2014-05-30 ENCOUNTER — Other Ambulatory Visit: Payer: Self-pay | Admitting: Endocrinology

## 2014-06-01 ENCOUNTER — Telehealth: Payer: Self-pay | Admitting: Endocrinology

## 2014-06-01 MED ORDER — SITAGLIPTIN PHOS-METFORMIN HCL 50-1000 MG PO TABS: 1.0000 | ORAL_TABLET | Freq: Two times a day (BID) | ORAL | Status: DC

## 2014-06-01 NOTE — Telephone Encounter (Signed)
erx done

## 2014-06-01 NOTE — Telephone Encounter (Signed)
Patient need refill Janumet 50-1000 mg

## 2014-06-10 ENCOUNTER — Ambulatory Visit: Payer: Medicare Other | Admitting: Endocrinology

## 2014-06-15 ENCOUNTER — Encounter: Payer: Self-pay | Admitting: *Deleted

## 2014-06-15 ENCOUNTER — Other Ambulatory Visit: Payer: Self-pay | Admitting: Endocrinology

## 2014-06-15 DIAGNOSIS — H40013 Open angle with borderline findings, low risk, bilateral: Secondary | ICD-10-CM | POA: Diagnosis not present

## 2014-06-15 DIAGNOSIS — H35033 Hypertensive retinopathy, bilateral: Secondary | ICD-10-CM | POA: Diagnosis not present

## 2014-06-15 DIAGNOSIS — H2513 Age-related nuclear cataract, bilateral: Secondary | ICD-10-CM | POA: Diagnosis not present

## 2014-06-15 DIAGNOSIS — H35363 Drusen (degenerative) of macula, bilateral: Secondary | ICD-10-CM | POA: Diagnosis not present

## 2014-06-15 LAB — HM DIABETES EYE EXAM

## 2014-06-22 ENCOUNTER — Other Ambulatory Visit: Payer: Self-pay | Admitting: Endocrinology

## 2014-07-02 ENCOUNTER — Telehealth: Payer: Self-pay | Admitting: Endocrinology

## 2014-07-02 ENCOUNTER — Other Ambulatory Visit: Payer: Self-pay | Admitting: Endocrinology

## 2014-07-02 DIAGNOSIS — E291 Testicular hypofunction: Secondary | ICD-10-CM

## 2014-07-02 DIAGNOSIS — D508 Other iron deficiency anemias: Secondary | ICD-10-CM

## 2014-07-02 NOTE — Telephone Encounter (Signed)
Pt called and wants a full blood work done tomorrow 07/03/14 when he comes, including urine and prostate test

## 2014-07-02 NOTE — Telephone Encounter (Signed)
Please see below and advise if okay?

## 2014-07-03 ENCOUNTER — Other Ambulatory Visit: Payer: Medicare Other

## 2014-07-03 ENCOUNTER — Other Ambulatory Visit (INDEPENDENT_AMBULATORY_CARE_PROVIDER_SITE_OTHER): Payer: Medicare Other

## 2014-07-03 ENCOUNTER — Other Ambulatory Visit: Payer: Self-pay | Admitting: *Deleted

## 2014-07-03 ENCOUNTER — Other Ambulatory Visit: Payer: Self-pay | Admitting: Endocrinology

## 2014-07-03 DIAGNOSIS — E119 Type 2 diabetes mellitus without complications: Secondary | ICD-10-CM

## 2014-07-03 DIAGNOSIS — IMO0002 Reserved for concepts with insufficient information to code with codable children: Secondary | ICD-10-CM

## 2014-07-03 DIAGNOSIS — E1165 Type 2 diabetes mellitus with hyperglycemia: Secondary | ICD-10-CM | POA: Diagnosis not present

## 2014-07-03 DIAGNOSIS — E039 Hypothyroidism, unspecified: Secondary | ICD-10-CM

## 2014-07-03 LAB — URINALYSIS, ROUTINE W REFLEX MICROSCOPIC
Bilirubin Urine: NEGATIVE
Hgb urine dipstick: NEGATIVE
Ketones, ur: NEGATIVE
Leukocytes, UA: NEGATIVE
Nitrite: NEGATIVE
RBC / HPF: NONE SEEN (ref 0–?)
Specific Gravity, Urine: 1.02 (ref 1.000–1.030)
Total Protein, Urine: NEGATIVE
Urine Glucose: NEGATIVE
Urobilinogen, UA: 0.2 (ref 0.0–1.0)
WBC, UA: NONE SEEN (ref 0–?)
pH: 5.5 (ref 5.0–8.0)

## 2014-07-03 LAB — URINALYSIS
Bilirubin Urine: NEGATIVE
Hgb urine dipstick: NEGATIVE
Ketones, ur: NEGATIVE
Leukocytes, UA: NEGATIVE
Nitrite: NEGATIVE
Specific Gravity, Urine: 1.02 (ref 1.000–1.030)
Total Protein, Urine: NEGATIVE
Urine Glucose: NEGATIVE
Urobilinogen, UA: 0.2 (ref 0.0–1.0)
pH: 5.5 (ref 5.0–8.0)

## 2014-07-03 LAB — MICROALBUMIN / CREATININE URINE RATIO
Creatinine,U: 131.9 mg/dL
Microalb Creat Ratio: 0.5 mg/g (ref 0.0–30.0)
Microalb, Ur: <0.7 mg/dL (ref 0.0–1.9)

## 2014-07-06 ENCOUNTER — Other Ambulatory Visit (INDEPENDENT_AMBULATORY_CARE_PROVIDER_SITE_OTHER): Payer: Medicare Other

## 2014-07-06 ENCOUNTER — Other Ambulatory Visit: Payer: Self-pay

## 2014-07-06 DIAGNOSIS — IMO0002 Reserved for concepts with insufficient information to code with codable children: Secondary | ICD-10-CM

## 2014-07-06 DIAGNOSIS — E1165 Type 2 diabetes mellitus with hyperglycemia: Secondary | ICD-10-CM

## 2014-07-06 DIAGNOSIS — E039 Hypothyroidism, unspecified: Secondary | ICD-10-CM

## 2014-07-06 LAB — CBC
HCT: 38.4 % — ABNORMAL LOW (ref 39.0–52.0)
Hemoglobin: 12.6 g/dL — ABNORMAL LOW (ref 13.0–17.0)
MCHC: 32.8 g/dL (ref 30.0–36.0)
MCV: 91.5 fl (ref 78.0–100.0)
Platelets: 202 K/uL (ref 150.0–400.0)
RBC: 4.2 Mil/uL — ABNORMAL LOW (ref 4.22–5.81)
RDW: 14.2 % (ref 11.5–15.5)
WBC: 6.1 K/uL (ref 4.0–10.5)

## 2014-07-06 LAB — TSH: TSH: 0.69 u[IU]/mL (ref 0.35–4.50)

## 2014-07-06 LAB — COMPREHENSIVE METABOLIC PANEL
ALT: 16 U/L (ref 0–53)
AST: 25 U/L (ref 0–37)
Albumin: 4.2 g/dL (ref 3.5–5.2)
Alkaline Phosphatase: 66 U/L (ref 39–117)
BUN: 22 mg/dL (ref 6–23)
CO2: 24 mEq/L (ref 19–32)
Calcium: 9.3 mg/dL (ref 8.4–10.5)
Chloride: 105 mEq/L (ref 96–112)
Creatinine, Ser: 1.38 mg/dL (ref 0.40–1.50)
GFR: 53.24 mL/min — ABNORMAL LOW (ref >60.00)
Glucose, Bld: 121 mg/dL — ABNORMAL HIGH (ref 70–99)
Potassium: 4.4 mEq/L (ref 3.5–5.1)
Sodium: 139 mEq/L (ref 135–145)
Total Bilirubin: 0.4 mg/dL (ref 0.2–1.2)
Total Protein: 7.1 g/dL (ref 6.0–8.3)

## 2014-07-06 LAB — HEMOGLOBIN A1C: Hgb A1c MFr Bld: 6.4 % (ref 4.6–6.5)

## 2014-07-09 ENCOUNTER — Ambulatory Visit (INDEPENDENT_AMBULATORY_CARE_PROVIDER_SITE_OTHER): Payer: Medicare Other | Admitting: Endocrinology

## 2014-07-09 ENCOUNTER — Encounter: Payer: Self-pay | Admitting: Endocrinology

## 2014-07-09 VITALS — BP 116/70 | HR 80 | Temp 97.9°F | Resp 16 | Ht 70.0 in | Wt 180.2 lb

## 2014-07-09 DIAGNOSIS — E119 Type 2 diabetes mellitus without complications: Secondary | ICD-10-CM | POA: Diagnosis not present

## 2014-07-09 DIAGNOSIS — D508 Other iron deficiency anemias: Secondary | ICD-10-CM | POA: Diagnosis not present

## 2014-07-09 DIAGNOSIS — E291 Testicular hypofunction: Secondary | ICD-10-CM

## 2014-07-09 DIAGNOSIS — E7800 Pure hypercholesterolemia, unspecified: Secondary | ICD-10-CM

## 2014-07-09 NOTE — Addendum Note (Signed)
Addended by: Elayne Snare on: 07/09/2014 05:20 PM   Modules accepted: Level of Service

## 2014-07-09 NOTE — Patient Instructions (Signed)
Cut Losartan in 1/2 daily

## 2014-07-09 NOTE — Progress Notes (Signed)
Patient ID: James Juarez, male   DOB: 03-24-1940, 74 y.o.   MRN: [id number]   Reason for Appointment: Diabetes follow-up   History of Present Illness   Diagnosis: Type 2 DIABETES MELITUS  He has had long-standing diabetes which has been well-controlled with a 4 drug regimen which has been unchanged for a few years He is usually is fairly compliant with his diet and exercise regimen with walking regularly  A1c is usually upper normal and is stable at 6.4 again He has checked blood sugars at home somewhat infrequently but these are looking excellent  He has gained 3 pounds but his weight fluctuates somewhat  He may feel slightly hypoglycemic if late for lunch     Oral hypoglycemic drugs: Glipizide ER, Actos and Janumet        Side effects from medications: None Proper timing of medications in relation to meals: Yes.         Monitors blood glucose:  infrequently    Glucometer: ? Accu-Chek     Blood Glucose readings from meter download:  30 day average is  110  FASTING range 84-110   Late evening blood sugars 110-149 with only one reading before supper    Diet: Generally low fat and small portions        Physical activity: exercise: Walking 20-30  min, 5/7 days a week, more recently             Wt Readings from Last 3 Encounters:  07/09/14 180 lb 3.2 oz (81.738 kg)  10/24/13 177 lb 3.2 oz (80.377 kg)  04/18/13 182 lb (82.555 kg)    He has several other active problems which are discussed in review of systems   LABS:  Lab Results  Component Value Date   HGBA1C 6.4 07/06/2014   HGBA1C 6.4 10/21/2013   HGBA1C 6.3 08/19/2012   Lab Results  Component Value Date   MICROALBUR <0.7 07/03/2014   LDLCALC 94 04/14/2013   CREATININE 1.38 07/06/2014     Lab on 07/06/2014  Component Date Value Ref Range Status  . Hgb A1c MFr Bld 07/06/2014 6.4  4.6 - 6.5 % Final   Glycemic Control Guidelines for People with Diabetes:Non Diabetic:  <6%Goal of Therapy: <7%Additional Action Suggested:   >8%   . Sodium 07/06/2014 139  135 - 145 mEq/L Final  . Potassium 07/06/2014 4.4  3.5 - 5.1 mEq/L Final  . Chloride 07/06/2014 105  96 - 112 mEq/L Final  . CO2 07/06/2014 24  19 - 32 mEq/L Final  . Glucose, Bld 07/06/2014 121* 70 - 99 mg/dL Final  . BUN 07/06/2014 22  6 - 23 mg/dL Final  . Creatinine, Ser 07/06/2014 1.38  0.40 - 1.50 mg/dL Final  . Total Bilirubin 07/06/2014 0.4  0.2 - 1.2 mg/dL Final  . Alkaline Phosphatase 07/06/2014 66  39 - 117 U/L Final  . AST 07/06/2014 25  0 - 37 U/L Final  . ALT 07/06/2014 16  0 - 53 U/L Final  . Total Protein 07/06/2014 7.1  6.0 - 8.3 g/dL Final  . Albumin 07/06/2014 4.2  3.5 - 5.2 g/dL Final  . Calcium 07/06/2014 9.3  8.4 - 10.5 mg/dL Final  . GFR 07/06/2014 53.24* >60.00 mL/min Final  . WBC 07/06/2014 6.1  4.0 - 10.5 K/uL Final  . RBC 07/06/2014 4.20* 4.22 - 5.81 Mil/uL Final  . Platelets 07/06/2014 202.0  150.0 - 400.0 K/uL Final  . Hemoglobin 07/06/2014 12.6* 13.0 - 17.0 g/dL Final  .  HCT 07/06/2014 38.4* 39.0 - 52.0 % Final  . MCV 07/06/2014 91.5  78.0 - 100.0 fl Final  . MCHC 07/06/2014 32.8  30.0 - 36.0 g/dL Final  . RDW 07/06/2014 14.2  11.5 - 15.5 % Final  . TSH 07/06/2014 0.69  0.35 - 4.50 uIU/mL Final  Appointment on 07/03/2014  Component Date Value Ref Range Status  . Microalb, Ur 07/03/2014 <0.7  0.0 - 1.9 mg/dL Final  . Creatinine,U 07/03/2014 131.9   Final  . Microalb Creat Ratio 07/03/2014 0.5  0.0 - 30.0 mg/g Final  . Color, Urine 07/03/2014 YELLOW  Yellow;Lt. Yellow Final  . APPearance 07/03/2014 CLEAR  Clear Final  . Specific Gravity, Urine 07/03/2014 1.020  1.000-1.030 Final  . pH 07/03/2014 5.5  5.0 - 8.0 Final  . Total Protein, Urine 07/03/2014 NEGATIVE  Negative Final  . Urine Glucose 07/03/2014 NEGATIVE  Negative Final  . Ketones, ur 07/03/2014 NEGATIVE  Negative Final  . Bilirubin Urine 07/03/2014 NEGATIVE  Negative Final  . Hgb urine dipstick 07/03/2014 NEGATIVE  Negative Final  . Urobilinogen, UA 07/03/2014  0.2  0.0 - 1.0 Final  . Leukocytes, UA 07/03/2014 NEGATIVE  Negative Final  . Nitrite 07/03/2014 NEGATIVE  Negative Final  . WBC, UA 07/03/2014 none seen  0-2/hpf Final  . RBC / HPF 07/03/2014 none seen  0-2/hpf Final  . Squamous Epithelial / LPF 07/03/2014 Rare(0-4/hpf)  Rare(0-4/hpf) Final  . Color, Urine 07/03/2014 YELLOW  Yellow;Lt. Yellow Final  . APPearance 07/03/2014 CLEAR  Clear Final  . Specific Gravity, Urine 07/03/2014 1.020  1.000-1.030 Final  . pH 07/03/2014 5.5  5.0 - 8.0 Final  . Total Protein, Urine 07/03/2014 NEGATIVE  Negative Final  . Urine Glucose 07/03/2014 NEGATIVE  Negative Final  . Ketones, ur 07/03/2014 NEGATIVE  Negative Final  . Bilirubin Urine 07/03/2014 NEGATIVE  Negative Final  . Hgb urine dipstick 07/03/2014 NEGATIVE  Negative Final  . Urobilinogen, UA 07/03/2014 0.2  0.0 - 1.0 Final  . Leukocytes, UA 07/03/2014 NEGATIVE  Negative Final  . Nitrite 07/03/2014 NEGATIVE  Negative Final      Medication List       This list is accurate as of: 07/09/14  1:29 PM.  Always use your most recent med list.               ACCU-CHEK FASTCLIX LANCETS Misc  1 each by Does not apply route 2 (two) times daily. Dx code 250.00     acetaminophen 325 MG tablet  Commonly known as:  TYLENOL  Take 325 mg by mouth every 6 (six) hours as needed. For pain     albuterol 108 (90 BASE) MCG/ACT inhaler  Commonly known as:  PROVENTIL HFA;VENTOLIN HFA  Inhale 2 puffs into the lungs every 6 (six) hours as needed. For shortness of breath     clobetasol 0.05 % Gel  Commonly known as:  TEMOVATE  Apply 1 application topically daily as needed. For face.     CRESTOR 5 MG tablet  Generic drug:  rosuvastatin  TAKE 1 TABLET BY MOUTH EVERY DAY     fexofenadine 180 MG tablet  Commonly known as:  ALLEGRA  Take 180 mg by mouth daily as needed. For allergies     glipiZIDE 5 MG 24 hr tablet  Commonly known as:  GLUCOTROL XL  TAKE 1 TABLET BY MOUTH EVERY DAY     glucose blood test  strip  Commonly known as:  ACCU-CHEK SMARTVIEW  Use as instructed to check blood  sugars two times a day, Dx code 250.00     hydroxypropyl methylcellulose / hypromellose 2.5 % ophthalmic solution  Commonly known as:  ISOPTO TEARS / GONIOVISC  Place 1 drop into both eyes every other day.     levothyroxine 75 MCG tablet  Commonly known as:  SYNTHROID, LEVOTHROID  TAKE 1 TABLET DAILY BEFORE BREAKFAST     losartan 50 MG tablet  Commonly known as:  COZAAR  Take 1 tablet (50 mg total) by mouth daily.     multivitamin with minerals Tabs tablet  Take 1 tablet by mouth daily.     pioglitazone 30 MG tablet  Commonly known as:  ACTOS  TAKE 1 TABLET BY MOUTH EVERY DAY     sitaGLIPtin-metformin 50-1000 MG per tablet  Commonly known as:  JANUMET  Take 1 tablet by mouth 2 (two) times daily with a meal.        Allergies: No Known Allergies  Past Medical History  Diagnosis Date  . Diabetes mellitus   . Hypertension   . Asthma   . Diverticular disease   . Subdural hematoma May 2013    bilateral     Past Surgical History  Procedure Laterality Date  . Hernia repair    . Burr hole  05/22/2011    Procedure: Haskell Flirt;  Surgeon: Elaina Hoops, MD;  Location: [place] [facility];  Service: Neurosurgery;  Laterality: Right;  Right Burr Holes for evacuation of subdural hematoma  . Burr hole  05/23/2011    Procedure: Haskell Flirt;  Surgeon: Elaina Hoops, MD;  Location: [place] [facility];  Service: Neurosurgery;  Laterality: Left;  Left [facility]  . Craniotomy  06/14/2011    Procedure: CRANIOTOMY HEMATOMA EVACUATION SUBDURAL;  Surgeon: Elaina Hoops, MD;  Location: [place] [facility];  Service: Neurosurgery;  Laterality: N/A;  Left Craniotomy for subdural hematoma  . Craniotomy  06/23/2011    Procedure: CRANIOTOMY HEMATOMA EVACUATION SUBDURAL;  Surgeon: Elaina Hoops, MD;  Location: [place] [facility];  Service: Neurosurgery;  Laterality: Left;  Redo Craniotomy for Subdural Hematoma    No family history on file.  Social  History:  reports that he has quit smoking. He quit smokeless tobacco use about 36 years ago. He reports that he does not drink alcohol or use illicit drugs.  Review of Systems:  HYPERTENSION:  he has had hypertension for several years   Currently on 50 mg losartan with good control and no lightheadedness.  He thinks his blood pressure is about 502 systolic at home   RENAL dysfunction: His creatinine is trending higher and he is concerned about his relatively low GFR.  Currently on losartan  HYPERLIPIDEMIA: The lipid abnormality consists of elevated LDL, well controlled with 5 mg Crestor  Lab Results  Component Value Date   CHOL 160 04/14/2013   HDL 51.30 04/14/2013   LDLCALC 94 04/14/2013   TRIG 75.0 04/14/2013   CHOLHDL 3 04/14/2013    HYPOTHYROIDISM: he has had long-standing mild hypothyroidism since 1995 and since then has been on 75 mcg  On his last visit he was told to take 6 a half tablets a week because of low normal TSH but he did not do so. His TSH is not as low at this time.  No complaints of unusual fatigue or shakiness  No previous history of goiter   Lab Results  Component Value Date   TSH 0.69 07/06/2014   TSH 0.36 10/21/2013   TSH 0.63 04/14/2013  FREET4 1.15 04/14/2013    No symptoms of numbness in his feet. Diabetic foot exam done in  6/16  He has had anemia which is multifactorial and has been given iron by PCP.   Still is taking his B12 supplement for pernicious anemia Hemoglobin is gradually improving and nearly normal now   Lab Results  Component Value Date   WBC 6.1 07/06/2014   HGB 12.6* 07/06/2014   HCT 38.4* 07/06/2014   MCV 91.5 07/06/2014   PLT 202.0 07/06/2014    PRIMARY HYPOGONADISM:  He has minimal symptoms and his diagnosis was made in 2/14 with a free T level of 4.8, increased LH of 33 He still feels fairly well and is not concerned about it, his levels have not been checked again   He is asking about PSA testing and discussed  evidence against doing this after the age of 55     he feels a little fullness in his ears and is asking them to be checked   Examination:   BP 116/70 mmHg  Pulse 80  Temp(Src) 97.9 F (36.6 C)  Resp 16  Ht 5\' 10"  (1.778 m)  Wt 180 lb 3.2 oz (81.738 kg)  BMI 25.86 kg/m2  SpO2 95%  Body mass index is 25.86 kg/(m^2).   No ankle edema Diabetic foot exam shows normal monofilament sensation in the toes and plantar surfaces, no skin lesions or ulcers on the feet and normal pedal pulses   he has mild ear wax on the right side and normal  Tympanic membrane on the left  ASSESSMENT/ PLAN:   Diabetes type 2   The patient's diabetes control appears to be overall  well controlled with consistently A1c His readings are fairly good at home including some at night He is usually compliant with diet and exercise He will continue the same regimen including glipizide ER but advised him to call if he feels hypoglycemic more than rarely To have A1c checked in 6 months on followup  Preventive care: Has had regular eye and foot exams and vaccinations  HYPERTENSION: Blood pressure is low normal  Renal dysfunction: This is slightly worse and he agrees to try reducing his losartan to 25 mg and continue to monitor his blood pressure  Hypothyroidism: TSH normal, he will continue the same dose   LIPIDS: excellent control  Anemia: He is on iron and B12 and his level is nearly normal now  He needs to follow-up with PCP for general care also     [facility] 07/09/2014, 1:29 PM

## 2014-07-25 ENCOUNTER — Other Ambulatory Visit: Payer: Self-pay | Admitting: Endocrinology

## 2014-09-07 ENCOUNTER — Ambulatory Visit: Payer: Medicare Other | Admitting: Podiatry

## 2014-10-14 ENCOUNTER — Ambulatory Visit (INDEPENDENT_AMBULATORY_CARE_PROVIDER_SITE_OTHER): Payer: Medicare Other | Admitting: Podiatry

## 2014-10-14 ENCOUNTER — Ambulatory Visit (INDEPENDENT_AMBULATORY_CARE_PROVIDER_SITE_OTHER): Payer: Medicare Other

## 2014-10-14 ENCOUNTER — Encounter: Payer: Self-pay | Admitting: Podiatry

## 2014-10-14 VITALS — BP 126/77 | HR 69 | Resp 16 | Ht 70.0 in | Wt 179.0 lb

## 2014-10-14 DIAGNOSIS — S93409A Sprain of unspecified ligament of unspecified ankle, initial encounter: Secondary | ICD-10-CM

## 2014-10-14 DIAGNOSIS — M779 Enthesopathy, unspecified: Secondary | ICD-10-CM | POA: Diagnosis not present

## 2014-10-14 DIAGNOSIS — M79673 Pain in unspecified foot: Secondary | ICD-10-CM

## 2014-10-14 MED ORDER — TRIAMCINOLONE ACETONIDE 10 MG/ML IJ SUSP
10.0000 mg | Freq: Once | INTRAMUSCULAR | Status: AC
  Administered 2014-10-14: 10 mg

## 2014-10-14 NOTE — Progress Notes (Signed)
   Subjective:    Patient ID: James Juarez, male    DOB: [birth year], 74 y.o.   MRN: [id number]  HPI Patient presents with bilateral ankle pain, lateral side; x1 month. Pt is using a compression sock with some relief; used ice pack with relief. Pt diabetic, did not check sugar level today; A1C=6.4 (07/06/14).   Review of Systems  Musculoskeletal: Positive for gait problem.  All other systems reviewed and are negative.      Objective:   Physical Exam        Assessment & Plan:

## 2014-10-14 NOTE — Progress Notes (Signed)
Subjective:     Patient ID: James Juarez, male   DOB: November 12, 1940, 74 y.o.   MRN: [id number]  HPI patient presents stating I'm getting some pain in the ankles of both my feet and it's been going on for over a month. I may have sprained them return in them but I don't remember a specific bad injury   Review of Systems  All other systems reviewed and are negative.      Objective:   Physical Exam  Constitutional: He is oriented to person, place, and time.  Cardiovascular: Intact distal pulses.   Musculoskeletal: Normal range of motion.  Neurological: He is oriented to person, place, and time.  Skin: Skin is warm.  Nursing note and vitals reviewed.  neurovascular status intact muscle strength adequate range of motion within normal limits with patient having moderate depression of the arch bilateral and noted to have discomfort in the lateral aspect of the ankles bilateral sinus tarsi with inflammation and fluid noted. Patient has mild medial pain but not to the same degree and there is mild edema in the area. Patient has good digital perfusion and is well oriented 3     Assessment:     Inflammatory capsulitis of the sinus tarsi bilateral    Plan:     H&P x-rays reviewed condition discussed with patient. At this point I went ahead and I injected the sinus tarsi bilateral 3 mg Kenalog 5 mg Xylocaine and instructed on reduced activity. If symptoms persist reappoint

## 2014-10-20 DIAGNOSIS — Z23 Encounter for immunization: Secondary | ICD-10-CM | POA: Diagnosis not present

## 2014-11-23 ENCOUNTER — Other Ambulatory Visit: Payer: Self-pay | Admitting: Endocrinology

## 2014-12-18 DIAGNOSIS — I10 Essential (primary) hypertension: Secondary | ICD-10-CM | POA: Diagnosis not present

## 2014-12-18 DIAGNOSIS — E538 Deficiency of other specified B group vitamins: Secondary | ICD-10-CM | POA: Diagnosis not present

## 2014-12-18 DIAGNOSIS — E1122 Type 2 diabetes mellitus with diabetic chronic kidney disease: Secondary | ICD-10-CM | POA: Diagnosis not present

## 2014-12-18 DIAGNOSIS — D649 Anemia, unspecified: Secondary | ICD-10-CM | POA: Diagnosis not present

## 2014-12-18 DIAGNOSIS — Z79899 Other long term (current) drug therapy: Secondary | ICD-10-CM | POA: Diagnosis not present

## 2014-12-18 DIAGNOSIS — N183 Chronic kidney disease, stage 3 (moderate): Secondary | ICD-10-CM | POA: Diagnosis not present

## 2014-12-18 DIAGNOSIS — E039 Hypothyroidism, unspecified: Secondary | ICD-10-CM | POA: Diagnosis not present

## 2014-12-22 DIAGNOSIS — E039 Hypothyroidism, unspecified: Secondary | ICD-10-CM | POA: Diagnosis not present

## 2014-12-22 DIAGNOSIS — E785 Hyperlipidemia, unspecified: Secondary | ICD-10-CM | POA: Diagnosis not present

## 2014-12-22 DIAGNOSIS — D649 Anemia, unspecified: Secondary | ICD-10-CM | POA: Diagnosis not present

## 2014-12-22 DIAGNOSIS — Z1389 Encounter for screening for other disorder: Secondary | ICD-10-CM | POA: Diagnosis not present

## 2014-12-22 DIAGNOSIS — I10 Essential (primary) hypertension: Secondary | ICD-10-CM | POA: Diagnosis not present

## 2014-12-22 DIAGNOSIS — E1122 Type 2 diabetes mellitus with diabetic chronic kidney disease: Secondary | ICD-10-CM | POA: Diagnosis not present

## 2014-12-22 DIAGNOSIS — Z0001 Encounter for general adult medical examination with abnormal findings: Secondary | ICD-10-CM | POA: Diagnosis not present

## 2014-12-22 DIAGNOSIS — N183 Chronic kidney disease, stage 3 (moderate): Secondary | ICD-10-CM | POA: Diagnosis not present

## 2014-12-22 DIAGNOSIS — Z7984 Long term (current) use of oral hypoglycemic drugs: Secondary | ICD-10-CM | POA: Diagnosis not present

## 2014-12-22 DIAGNOSIS — J4520 Mild intermittent asthma, uncomplicated: Secondary | ICD-10-CM | POA: Diagnosis not present

## 2014-12-23 ENCOUNTER — Other Ambulatory Visit: Payer: Self-pay | Admitting: Endocrinology

## 2014-12-28 DIAGNOSIS — Z1211 Encounter for screening for malignant neoplasm of colon: Secondary | ICD-10-CM | POA: Diagnosis not present

## 2015-01-05 ENCOUNTER — Other Ambulatory Visit: Payer: Medicare Other

## 2015-01-08 ENCOUNTER — Ambulatory Visit: Payer: Medicare Other | Admitting: Endocrinology

## 2015-01-13 ENCOUNTER — Other Ambulatory Visit: Payer: Self-pay | Admitting: Endocrinology

## 2015-01-19 DIAGNOSIS — D509 Iron deficiency anemia, unspecified: Secondary | ICD-10-CM | POA: Diagnosis not present

## 2015-01-19 DIAGNOSIS — J069 Acute upper respiratory infection, unspecified: Secondary | ICD-10-CM | POA: Diagnosis not present

## 2015-01-21 ENCOUNTER — Other Ambulatory Visit: Payer: Self-pay | Admitting: Endocrinology

## 2015-02-22 ENCOUNTER — Other Ambulatory Visit: Payer: Self-pay | Admitting: Endocrinology

## 2015-02-22 DIAGNOSIS — H04123 Dry eye syndrome of bilateral lacrimal glands: Secondary | ICD-10-CM | POA: Diagnosis not present

## 2015-02-22 DIAGNOSIS — H5200 Hypermetropia, unspecified eye: Secondary | ICD-10-CM | POA: Diagnosis not present

## 2015-02-22 DIAGNOSIS — H02833 Dermatochalasis of right eye, unspecified eyelid: Secondary | ICD-10-CM | POA: Diagnosis not present

## 2015-02-22 DIAGNOSIS — H40013 Open angle with borderline findings, low risk, bilateral: Secondary | ICD-10-CM | POA: Diagnosis not present

## 2015-03-30 ENCOUNTER — Other Ambulatory Visit (INDEPENDENT_AMBULATORY_CARE_PROVIDER_SITE_OTHER): Payer: Medicare Other

## 2015-03-30 DIAGNOSIS — D508 Other iron deficiency anemias: Secondary | ICD-10-CM | POA: Diagnosis not present

## 2015-03-30 DIAGNOSIS — E291 Testicular hypofunction: Secondary | ICD-10-CM | POA: Diagnosis not present

## 2015-03-30 DIAGNOSIS — E119 Type 2 diabetes mellitus without complications: Secondary | ICD-10-CM | POA: Diagnosis not present

## 2015-03-30 LAB — CBC
HCT: 38.1 % — ABNORMAL LOW (ref 39.0–52.0)
Hemoglobin: 12.5 g/dL — ABNORMAL LOW (ref 13.0–17.0)
MCHC: 32.8 g/dL (ref 30.0–36.0)
MCV: 91.4 fl (ref 78.0–100.0)
Platelets: 195 10*3/uL (ref 150.0–400.0)
RBC: 4.16 Mil/uL — ABNORMAL LOW (ref 4.22–5.81)
RDW: 14 % (ref 11.5–15.5)
WBC: 5.9 10*3/uL (ref 4.0–10.5)

## 2015-03-30 LAB — COMPREHENSIVE METABOLIC PANEL
ALT: 15 U/L (ref 0–53)
AST: 21 U/L (ref 0–37)
Albumin: 4 g/dL (ref 3.5–5.2)
Alkaline Phosphatase: 57 U/L (ref 39–117)
BUN: 21 mg/dL (ref 6–23)
CO2: 28 mEq/L (ref 19–32)
Calcium: 9.4 mg/dL (ref 8.4–10.5)
Chloride: 105 mEq/L (ref 96–112)
Creatinine, Ser: 1.37 mg/dL (ref 0.40–1.50)
GFR: 53.58 mL/min — ABNORMAL LOW (ref >60.00)
Glucose, Bld: 112 mg/dL — ABNORMAL HIGH (ref 70–99)
Potassium: 4.6 mEq/L (ref 3.5–5.1)
Sodium: 138 mEq/L (ref 135–145)
Total Bilirubin: 0.4 mg/dL (ref 0.2–1.2)
Total Protein: 6.8 g/dL (ref 6.0–8.3)

## 2015-03-30 LAB — HEMOGLOBIN A1C: Hgb A1c MFr Bld: 6.6 % — ABNORMAL HIGH (ref 4.6–6.5)

## 2015-03-30 LAB — TESTOSTERONE: Testosterone: 311.21 ng/dL (ref 300.00–890.00)

## 2015-03-30 LAB — T4, FREE: Free T4: 1.31 ng/dL (ref 0.60–1.60)

## 2015-03-30 LAB — TSH: TSH: 0.51 u[IU]/mL (ref 0.35–4.50)

## 2015-04-02 ENCOUNTER — Encounter: Payer: Self-pay | Admitting: Endocrinology

## 2015-04-02 ENCOUNTER — Ambulatory Visit (INDEPENDENT_AMBULATORY_CARE_PROVIDER_SITE_OTHER): Payer: Medicare Other | Admitting: Endocrinology

## 2015-04-02 VITALS — BP 122/64 | HR 83 | Temp 98.2°F | Resp 16 | Ht 70.0 in | Wt 179.6 lb

## 2015-04-02 DIAGNOSIS — E039 Hypothyroidism, unspecified: Secondary | ICD-10-CM | POA: Diagnosis not present

## 2015-04-02 DIAGNOSIS — E119 Type 2 diabetes mellitus without complications: Secondary | ICD-10-CM

## 2015-04-02 DIAGNOSIS — E7800 Pure hypercholesterolemia, unspecified: Secondary | ICD-10-CM | POA: Diagnosis not present

## 2015-04-02 DIAGNOSIS — D510 Vitamin B12 deficiency anemia due to intrinsic factor deficiency: Secondary | ICD-10-CM

## 2015-04-02 NOTE — Patient Instructions (Signed)
Check blood sugars on waking up 1-2   times a week Also check blood sugars about 2 hours after a meal and do this after different meals by rotation  Recommended blood sugar levels on waking up is 90-130 and about 2 hours after meal is 130-160  Please bring your blood sugar monitor to each visit, thank you  

## 2015-04-02 NOTE — Progress Notes (Signed)
Patient ID: James Juarez, male   DOB: [birth year], 75 y.o.   MRN: [id number]   Reason for Appointment: Diabetes follow-up   History of Present Illness   Diagnosis: Type 2 DIABETES MELITUS  He has had long-standing diabetes which has been well-controlled with a 4 drug regimen which has been unchanged for several years He is usually is fairly compliant with his diet and exercise regimen  However has not done much walking recently  A1c is usually upper normal and is slightly higher at 6.6 He has checked blood sugars at home somewhat infrequently and probably not any after meals Again did not bring his monitor for download Weight is stable      Oral hypoglycemic drugs: Glipizide ER 5 mg, Actos and Janumet 50/1000 twice a day        Side effects from medications: None Proper timing of medications in relation to meals: Yes.         Monitors blood glucose:  infrequently    Glucometer: ? Accu-Chek     Blood Glucose readings from recall:   FASTING range 99-102   Late evening blood sugars ?     Diet: Generally low fat and small portions        Physical activity: exercise: Walking 20-30  min, 5/7 days a week, less recently             Wt Readings from Last 3 Encounters:  04/02/15 179 lb 9.6 oz (81.466 kg)  10/14/14 179 lb (81.194 kg)  07/09/14 180 lb 3.2 oz (81.738 kg)     He has several other active problems which are discussed in review of systems   LABS:  Lab Results  Component Value Date   HGBA1C 6.6* 03/30/2015   HGBA1C 6.4 07/06/2014   HGBA1C 6.4 10/21/2013   Lab Results  Component Value Date   MICROALBUR <0.7 07/03/2014   LDLCALC 94 04/14/2013   CREATININE 1.37 03/30/2015     Lab on 03/30/2015  Component Date Value Ref Range Status  . Hgb A1c MFr Bld 03/30/2015 6.6* 4.6 - 6.5 % Final   Glycemic Control Guidelines for People with Diabetes:Non Diabetic:  <6%Goal of Therapy: <7%Additional Action Suggested:  >8%   . Sodium 03/30/2015 138  135 - 145 mEq/L  Final  . Potassium 03/30/2015 4.6  3.5 - 5.1 mEq/L Final  . Chloride 03/30/2015 105  96 - 112 mEq/L Final  . CO2 03/30/2015 28  19 - 32 mEq/L Final  . Glucose, Bld 03/30/2015 112* 70 - 99 mg/dL Final  . BUN 03/30/2015 21  6 - 23 mg/dL Final  . Creatinine, Ser 03/30/2015 1.37  0.40 - 1.50 mg/dL Final  . Total Bilirubin 03/30/2015 0.4  0.2 - 1.2 mg/dL Final  . Alkaline Phosphatase 03/30/2015 57  39 - 117 U/L Final  . AST 03/30/2015 21  0 - 37 U/L Final  . ALT 03/30/2015 15  0 - 53 U/L Final  . Total Protein 03/30/2015 6.8  6.0 - 8.3 g/dL Final  . Albumin 03/30/2015 4.0  3.5 - 5.2 g/dL Final  . Calcium 03/30/2015 9.4  8.4 - 10.5 mg/dL Final  . GFR 03/30/2015 53.58* >60.00 mL/min Final  . WBC 03/30/2015 5.9  4.0 - 10.5 K/uL Final  . RBC 03/30/2015 4.16* 4.22 - 5.81 Mil/uL Final  . Platelets 03/30/2015 195.0  150.0 - 400.0 K/uL Final  . Hemoglobin 03/30/2015 12.5* 13.0 - 17.0 g/dL Final  . HCT 03/30/2015 38.1* 39.0 - 52.0 % Final  .  MCV 03/30/2015 91.4  78.0 - 100.0 fl Final  . MCHC 03/30/2015 32.8  30.0 - 36.0 g/dL Final  . RDW 03/30/2015 14.0  11.5 - 15.5 % Final  . TSH 03/30/2015 0.51  0.35 - 4.50 uIU/mL Final  . Free T4 03/30/2015 1.31  0.60 - 1.60 ng/dL Final  . Testosterone 03/30/2015 311.21  300.00 - 890.00 ng/dL Final      Medication List       This list is accurate as of: 04/02/15  1:21 PM.  Always use your most recent med list.               ACCU-CHEK FASTCLIX LANCETS Misc  1 each by Does not apply route 2 (two) times daily. Dx code 250.00     acetaminophen 325 MG tablet  Commonly known as:  TYLENOL  Take 325 mg by mouth every 6 (six) hours as needed. For pain     albuterol 108 (90 Base) MCG/ACT inhaler  Commonly known as:  PROVENTIL HFA;VENTOLIN HFA  Inhale 2 puffs into the lungs every 6 (six) hours as needed. For shortness of breath     clobetasol 0.05 % Gel  Commonly known as:  TEMOVATE  Apply 1 application topically daily as needed. For face.     CRESTOR 5  MG tablet  Generic drug:  rosuvastatin  TAKE 1 TABLET BY MOUTH EVERY DAY     fexofenadine 180 MG tablet  Commonly known as:  ALLEGRA  Take 180 mg by mouth daily as needed. For allergies     glipiZIDE 5 MG 24 hr tablet  Commonly known as:  GLUCOTROL XL  TAKE 1 TABLET BY MOUTH EVERY DAY     glucose blood test strip  Commonly known as:  ACCU-CHEK SMARTVIEW  Use as instructed to check blood sugars two times a day, Dx code 250.00     hydroxypropyl methylcellulose / hypromellose 2.5 % ophthalmic solution  Commonly known as:  ISOPTO TEARS / GONIOVISC  Place 1 drop into both eyes every other day.     JANUMET 50-1000 MG tablet  Generic drug:  sitaGLIPtin-metformin  TAKE 1 TABLET BY MOUTH TWICE A DAY WITH A MEAL     levothyroxine 75 MCG tablet  Commonly known as:  SYNTHROID, LEVOTHROID  TAKE 1 TABLET DAILY BEFORE BREAKFAST     losartan 50 MG tablet  Commonly known as:  COZAAR  TAKE 1 TABLET EVERY DAY     multivitamin with minerals Tabs tablet  Take 1 tablet by mouth daily.     pioglitazone 30 MG tablet  Commonly known as:  ACTOS  TAKE 1 TABLET BY MOUTH EVERY DAY        Allergies: No Known Allergies  Past Medical History  Diagnosis Date  . Diabetes mellitus   . Hypertension   . Asthma   . Diverticular disease   . Subdural hematoma [facility]) May 2013    bilateral     Past Surgical History  Procedure Laterality Date  . Hernia repair    . Burr hole  05/22/2011    Procedure: Haskell Flirt;  Surgeon: Elaina Hoops, MD;  Location: [place] [facility];  Service: Neurosurgery;  Laterality: Right;  Right Burr Holes for evacuation of subdural hematoma  . Burr hole  05/23/2011    Procedure: Haskell Flirt;  Surgeon: Elaina Hoops, MD;  Location: [place] [facility];  Service: Neurosurgery;  Laterality: Left;  Left [facility]  . Craniotomy  06/14/2011    Procedure: CRANIOTOMY HEMATOMA  EVACUATION SUBDURAL;  Surgeon: Elaina Hoops, MD;  Location: [place] [facility];  Service: Neurosurgery;  Laterality: N/A;  Left  Craniotomy for subdural hematoma  . Craniotomy  06/23/2011    Procedure: CRANIOTOMY HEMATOMA EVACUATION SUBDURAL;  Surgeon: Elaina Hoops, MD;  Location: [place] [facility];  Service: Neurosurgery;  Laterality: Left;  Redo Craniotomy for Subdural Hematoma    No family history on file.  Social History:  reports that he has quit smoking. He quit smokeless tobacco use about 37 years ago. He reports that he does not drink alcohol or use illicit drugs.  Review of Systems:  HYPERTENSION:  he has had hypertension for several years   Currently on 50 mg losartan with good control and no lightheadedness.    RENAL dysfunction: His creatinine is mildly increased but stable He was asked to try only 25 mg losartan but he did not do so  Lab Results  Component Value Date   CREATININE 1.37 03/30/2015   CREATININE 1.38 07/06/2014   CREATININE 1.3 10/21/2013    HYPERLIPIDEMIA: The lipid abnormality consists of elevated LDL, well controlled with 5 mg Crestor His last LDL was 84 in 12/16 done by PCP  Lab Results  Component Value Date   CHOL 160 04/14/2013   HDL 51.30 04/14/2013   LDLCALC 94 04/14/2013   TRIG 75.0 04/14/2013   CHOLHDL 3 04/14/2013    HYPOTHYROIDISM: he has had long-standing mild hypothyroidism since 1995 and since then has been on 75 mcg No complaints of unusual fatigue or shakiness  No previous history of goiter Thyroid levels as follows:  Lab Results  Component Value Date   TSH 0.51 03/30/2015   TSH 0.69 07/06/2014   TSH 0.36 10/21/2013   FREET4 1.31 03/30/2015   FREET4 1.15 04/14/2013    No symptoms of numbness in his feet. Diabetic foot exam done in  6/16  He has had anemia which is multifactorial and has been given iron by PCP.   Still is taking his B12 supplement for pernicious anemia Hemoglobin is Stable He is asking about continuing iron  Lab Results  Component Value Date   WBC 5.9 03/30/2015   HGB 12.5* 03/30/2015   HCT 38.1* 03/30/2015   MCV 91.4 03/30/2015     PLT 195.0 03/30/2015    PRIMARY HYPOGONADISM:  He has minimal symptoms and his diagnosis was made in 2/14 with a free T level of 4.8, increased LH of 33 He still feels fairly well and his testosterone is low normal  Has occasional cough   Examination:   BP 122/64 mmHg  Pulse 83  Temp(Src) 98.2 F (36.8 C)  Resp 16  Ht 5\' 10"  (1.778 m)  Wt 179 lb 9.6 oz (81.466 kg)  BMI 25.77 kg/m2  SpO2 97%  Body mass index is 25.77 kg/(m^2).    No thyroid enlargement present Lungs clear  No ankle edema present  ASSESSMENT/ PLAN:   Diabetes type 2   The patient's diabetes appears to be overall  well controlled with consistently upper normal A1c His readings are fairly good at home but he has not done postprandial readings He is usually compliant with diet  However has not been consistent exercise and her mind edema were the importance of exercise  He will continue the same regimen  To have A1c checked in 6 months on followup  Preventive care: Has had regular eye and foot exams and vaccinations  HYPERTENSION: Blood pressure is  normal on losartan  Renal dysfunction: This is  stable  Hypothyroidism: TSH normal, he will continue the same dose, consider reducing the dose if TSH any lower   LIPIDS: excellent control  Anemia: He is on iron and B12  He needs to follow-up with PCP regarding continuing iron  Deferred his question about treating itching in his left ear to PCP, he probably needs hydrocortisone drops   Counseling time on subjects discussed above is over 50% of today's 25 minute visit   James Juarez 04/02/2015, 1:21 PM

## 2015-04-05 DIAGNOSIS — J209 Acute bronchitis, unspecified: Secondary | ICD-10-CM | POA: Diagnosis not present

## 2015-04-05 DIAGNOSIS — D649 Anemia, unspecified: Secondary | ICD-10-CM | POA: Diagnosis not present

## 2015-04-13 ENCOUNTER — Other Ambulatory Visit: Payer: Self-pay | Admitting: Endocrinology

## 2015-04-27 ENCOUNTER — Telehealth: Payer: Self-pay | Admitting: Endocrinology

## 2015-04-27 NOTE — Telephone Encounter (Signed)
Labs faxed to Dr. Penelope Coop

## 2015-04-27 NOTE — Telephone Encounter (Signed)
Patient would like to send his lab results to [facility], Dr Narda Amber fax [phone]

## 2015-05-07 DIAGNOSIS — D509 Iron deficiency anemia, unspecified: Secondary | ICD-10-CM | POA: Diagnosis not present

## 2015-06-17 ENCOUNTER — Other Ambulatory Visit: Payer: Self-pay | Admitting: Endocrinology

## 2015-06-20 ENCOUNTER — Other Ambulatory Visit: Payer: Self-pay | Admitting: Endocrinology

## 2015-07-14 ENCOUNTER — Other Ambulatory Visit: Payer: Self-pay

## 2015-07-14 MED ORDER — PIOGLITAZONE HCL 30 MG PO TABS: 30.0000 mg | ORAL_TABLET | Freq: Every day | ORAL | Status: DC

## 2015-07-14 MED ORDER — ROSUVASTATIN CALCIUM 5 MG PO TABS: 5.0000 mg | ORAL_TABLET | Freq: Every day | ORAL | Status: DC

## 2015-07-14 MED ORDER — LOSARTAN POTASSIUM 50 MG PO TABS: 50.0000 mg | ORAL_TABLET | Freq: Every day | ORAL | Status: DC

## 2015-07-19 ENCOUNTER — Other Ambulatory Visit: Payer: Self-pay | Admitting: Gastroenterology

## 2015-07-19 DIAGNOSIS — K5730 Diverticulosis of large intestine without perforation or abscess without bleeding: Secondary | ICD-10-CM | POA: Diagnosis not present

## 2015-07-19 DIAGNOSIS — D509 Iron deficiency anemia, unspecified: Secondary | ICD-10-CM | POA: Diagnosis not present

## 2015-07-19 DIAGNOSIS — D122 Benign neoplasm of ascending colon: Secondary | ICD-10-CM | POA: Diagnosis not present

## 2015-07-19 DIAGNOSIS — K635 Polyp of colon: Secondary | ICD-10-CM | POA: Diagnosis not present

## 2015-07-20 ENCOUNTER — Other Ambulatory Visit: Payer: Self-pay

## 2015-07-20 MED ORDER — PIOGLITAZONE HCL 30 MG PO TABS: 30.0000 mg | ORAL_TABLET | Freq: Every day | ORAL | Status: DC

## 2015-07-20 MED ORDER — ROSUVASTATIN CALCIUM 5 MG PO TABS: 5.0000 mg | ORAL_TABLET | Freq: Every day | ORAL | Status: DC

## 2015-08-17 DIAGNOSIS — D649 Anemia, unspecified: Secondary | ICD-10-CM | POA: Diagnosis not present

## 2015-08-22 ENCOUNTER — Other Ambulatory Visit: Payer: Self-pay | Admitting: Endocrinology

## 2015-09-29 DIAGNOSIS — H35373 Puckering of macula, bilateral: Secondary | ICD-10-CM | POA: Diagnosis not present

## 2015-09-29 DIAGNOSIS — H35363 Drusen (degenerative) of macula, bilateral: Secondary | ICD-10-CM | POA: Diagnosis not present

## 2015-09-29 DIAGNOSIS — H2513 Age-related nuclear cataract, bilateral: Secondary | ICD-10-CM | POA: Diagnosis not present

## 2015-09-29 DIAGNOSIS — H40013 Open angle with borderline findings, low risk, bilateral: Secondary | ICD-10-CM | POA: Diagnosis not present

## 2015-09-29 DIAGNOSIS — H35033 Hypertensive retinopathy, bilateral: Secondary | ICD-10-CM | POA: Diagnosis not present

## 2015-09-29 DIAGNOSIS — H25013 Cortical age-related cataract, bilateral: Secondary | ICD-10-CM | POA: Diagnosis not present

## 2015-10-04 ENCOUNTER — Other Ambulatory Visit: Payer: Self-pay | Admitting: *Deleted

## 2015-10-04 ENCOUNTER — Telehealth: Payer: Self-pay | Admitting: Endocrinology

## 2015-10-04 NOTE — Telephone Encounter (Signed)
What labs? He hasn't had any done since march?

## 2015-10-04 NOTE — Telephone Encounter (Signed)
What labs? He hasn't had any done since

## 2015-10-04 NOTE — Telephone Encounter (Signed)
Patient stated he want his lab work sent to Adrian Prows (Cardiology) office, as soon as possible. [phone]

## 2015-10-05 DIAGNOSIS — Z23 Encounter for immunization: Secondary | ICD-10-CM | POA: Diagnosis not present

## 2015-10-10 ENCOUNTER — Other Ambulatory Visit: Payer: Self-pay | Admitting: Endocrinology

## 2015-10-20 DIAGNOSIS — E119 Type 2 diabetes mellitus without complications: Secondary | ICD-10-CM | POA: Diagnosis not present

## 2015-10-20 DIAGNOSIS — E7800 Pure hypercholesterolemia, unspecified: Secondary | ICD-10-CM | POA: Diagnosis not present

## 2015-10-20 DIAGNOSIS — I10 Essential (primary) hypertension: Secondary | ICD-10-CM | POA: Diagnosis not present

## 2015-10-27 ENCOUNTER — Other Ambulatory Visit (INDEPENDENT_AMBULATORY_CARE_PROVIDER_SITE_OTHER): Payer: Medicare Other

## 2015-10-27 ENCOUNTER — Other Ambulatory Visit: Payer: Self-pay | Admitting: Endocrinology

## 2015-10-27 DIAGNOSIS — E039 Hypothyroidism, unspecified: Secondary | ICD-10-CM

## 2015-10-27 DIAGNOSIS — D510 Vitamin B12 deficiency anemia due to intrinsic factor deficiency: Secondary | ICD-10-CM | POA: Diagnosis not present

## 2015-10-27 DIAGNOSIS — E119 Type 2 diabetes mellitus without complications: Secondary | ICD-10-CM | POA: Diagnosis not present

## 2015-10-27 LAB — COMPREHENSIVE METABOLIC PANEL
ALT: 15 U/L (ref 0–53)
AST: 21 U/L (ref 0–37)
Albumin: 4.5 g/dL (ref 3.5–5.2)
Alkaline Phosphatase: 62 U/L (ref 39–117)
BUN: 24 mg/dL — ABNORMAL HIGH (ref 6–23)
CO2: 29 mEq/L (ref 19–32)
Calcium: 9.6 mg/dL (ref 8.4–10.5)
Chloride: 103 mEq/L (ref 96–112)
Creatinine, Ser: 1.41 mg/dL (ref 0.40–1.50)
GFR: 51.75 mL/min — ABNORMAL LOW (ref >60.00)
Glucose, Bld: 140 mg/dL — ABNORMAL HIGH (ref 70–99)
Potassium: 4.5 mEq/L (ref 3.5–5.1)
Sodium: 138 mEq/L (ref 135–145)
Total Bilirubin: 0.6 mg/dL (ref 0.2–1.2)
Total Protein: 7.3 g/dL (ref 6.0–8.3)

## 2015-10-27 LAB — T4, FREE: Free T4: 1.2 ng/dL (ref 0.60–1.60)

## 2015-10-27 LAB — TSH: TSH: 1.01 u[IU]/mL (ref 0.35–4.50)

## 2015-10-27 LAB — LIPID PANEL
Cholesterol: 147 mg/dL (ref 0–200)
HDL: 53.4 mg/dL (ref >39.00)
LDL Cholesterol: 81 mg/dL (ref 0–99)
NonHDL: 93.8
Total CHOL/HDL Ratio: 3
Triglycerides: 63 mg/dL (ref 0.0–149.0)
VLDL: 12.6 mg/dL (ref 0.0–40.0)

## 2015-10-27 LAB — MICROALBUMIN / CREATININE URINE RATIO
Creatinine,U: 232.6 mg/dL
Microalb Creat Ratio: 1.1 mg/g (ref 0.0–30.0)
Microalb, Ur: 2.5 mg/dL — ABNORMAL HIGH (ref 0.0–1.9)

## 2015-10-27 LAB — CBC
HCT: 39.2 % (ref 39.0–52.0)
Hemoglobin: 13 g/dL (ref 13.0–17.0)
MCHC: 33.1 g/dL (ref 30.0–36.0)
MCV: 90.6 fl (ref 78.0–100.0)
Platelets: 215 10*3/uL (ref 150.0–400.0)
RBC: 4.32 Mil/uL (ref 4.22–5.81)
RDW: 13.7 % (ref 11.5–15.5)
WBC: 5.3 10*3/uL (ref 4.0–10.5)

## 2015-10-27 LAB — HEMOGLOBIN A1C: Hgb A1c MFr Bld: 6.4 % (ref 4.6–6.5)

## 2015-11-02 ENCOUNTER — Encounter: Payer: Self-pay | Admitting: Endocrinology

## 2015-11-02 ENCOUNTER — Ambulatory Visit: Payer: Medicare Other | Admitting: Endocrinology

## 2015-11-02 ENCOUNTER — Other Ambulatory Visit: Payer: Self-pay | Admitting: *Deleted

## 2015-11-02 ENCOUNTER — Ambulatory Visit (INDEPENDENT_AMBULATORY_CARE_PROVIDER_SITE_OTHER): Payer: Medicare Other | Admitting: Endocrinology

## 2015-11-02 VITALS — BP 116/66 | HR 73 | Temp 98.0°F | Resp 16 | Ht 69.0 in | Wt 172.2 lb

## 2015-11-02 DIAGNOSIS — E119 Type 2 diabetes mellitus without complications: Secondary | ICD-10-CM

## 2015-11-02 DIAGNOSIS — E7800 Pure hypercholesterolemia, unspecified: Secondary | ICD-10-CM

## 2015-11-02 MED ORDER — GLUCOSE BLOOD VI STRP: ORAL_STRIP | 3 refills | Status: DC

## 2015-11-02 MED ORDER — ACCU-CHEK FASTCLIX LANCETS MISC: 3 refills | Status: AC

## 2015-11-02 NOTE — Patient Instructions (Signed)
Check blood sugars on waking up  2x weekly  Also check blood sugars about 2 hours after a meal and do this after different meals by rotation  Recommended blood sugar levels on waking up is 90-130 and about 2 hours after meal is 130-160  Please bring your blood sugar monitor to each visit, thank you  

## 2015-11-02 NOTE — Progress Notes (Signed)
Patient ID: James Juarez, male   DOB: Jun 17, 1940, 75 y.o.   MRN: [id number]   Reason for Appointment: Diabetes follow-up   History of Present Illness   Diagnosis: Type 2 DIABETES MELITUS  Oral hypoglycemic drugs: Glipizide ER 5 mg, Actos and Janumet 50/1000 twice a day   He has had long-standing diabetes which has been well-controlled with the same 4 drug regimen which has been unchanged for several years  He is usually is fairly compliant with his diet  However has not done as much walking recently However has lost weight since his last visit  A1c is usually upper normal and is slightly better at 6.4 He has checked blood sugars at home somewhat infrequently and has a few readings in the mornings averaging 99 and a couple of readings in the evenings which are 89 and 113 His glucose was 100 and the same morning when his lab glucose was 140  Side effects from medications: None        Monitors blood glucose:  infrequently    Glucometer:  Accu-Chek     Blood Glucose readings from download as above :      Diet: Generally low fat and small portions        Physical activity: exercise: Walking 20-30 min, 3/7 days a week, less recently             Wt Readings from Last 3 Encounters:  11/02/15 172 lb 3.2 oz (78.1 kg)  04/02/15 179 lb 9.6 oz (81.5 kg)  10/14/14 179 lb (81.2 kg)     He has several other active problems which are discussed in review of systems   LABS:  Lab Results  Component Value Date   HGBA1C 6.4 10/27/2015   HGBA1C 6.6 (H) 03/30/2015   HGBA1C 6.4 07/06/2014   Lab Results  Component Value Date   MICROALBUR 2.5 (H) 10/27/2015   LDLCALC 81 10/27/2015   CREATININE 1.41 10/27/2015     Lab on 10/27/2015  Component Date Value Ref Range Status  . Hgb A1c MFr Bld 10/27/2015 6.4  4.6 - 6.5 % Final  . Sodium 10/27/2015 138  135 - 145 mEq/L Final  . Potassium 10/27/2015 4.5  3.5 - 5.1 mEq/L Final  . Chloride 10/27/2015 103  96 - 112 mEq/L Final  . CO2  10/27/2015 29  19 - 32 mEq/L Final  . Glucose, Bld 10/27/2015 140* 70 - 99 mg/dL Final  . BUN 10/27/2015 24* 6 - 23 mg/dL Final  . Creatinine, Ser 10/27/2015 1.41  0.40 - 1.50 mg/dL Final  . Total Bilirubin 10/27/2015 0.6  0.2 - 1.2 mg/dL Final  . Alkaline Phosphatase 10/27/2015 62  39 - 117 U/L Final  . AST 10/27/2015 21  0 - 37 U/L Final  . ALT 10/27/2015 15  0 - 53 U/L Final  . Total Protein 10/27/2015 7.3  6.0 - 8.3 g/dL Final  . Albumin 10/27/2015 4.5  3.5 - 5.2 g/dL Final  . Calcium 10/27/2015 9.6  8.4 - 10.5 mg/dL Final  . GFR 10/27/2015 51.75* >60.00 mL/min Final  . WBC 10/27/2015 5.3  4.0 - 10.5 K/uL Final  . RBC 10/27/2015 4.32  4.22 - 5.81 Mil/uL Final  . Platelets 10/27/2015 215.0  150.0 - 400.0 K/uL Final  . Hemoglobin 10/27/2015 13.0  13.0 - 17.0 g/dL Final  . HCT 10/27/2015 39.2  39.0 - 52.0 % Final  . MCV 10/27/2015 90.6  78.0 - 100.0 fl Final  . MCHC 10/27/2015  33.1  30.0 - 36.0 g/dL Final  . RDW 10/27/2015 13.7  11.5 - 15.5 % Final  . Free T4 10/27/2015 1.20  0.60 - 1.60 ng/dL Final   Comment: Specimens from patients who are undergoing biotin therapy and /or ingesting biotin supplements may contain high levels of biotin.  The higher biotin concentration in these specimens interferes with this Free T4 assay.  Specimens that contain high levels  of biotin may cause false high results for this Free T4 assay.  Please interpret results in light of the total clinical presentation of the patient.    [name] Kitchen TSH 10/27/2015 1.01  0.35 - 4.50 uIU/mL Final  . Cholesterol 10/27/2015 147  0 - 200 mg/dL Final  . Triglycerides 10/27/2015 63.0  0.0 - 149.0 mg/dL Final  . HDL 10/27/2015 53.40  >39.00 mg/dL Final  . VLDL 10/27/2015 12.6  0.0 - 40.0 mg/dL Final  . LDL Cholesterol 10/27/2015 81  0 - 99 mg/dL Final  . Total CHOL/HDL Ratio 10/27/2015 3   Final  . NonHDL 10/27/2015 93.80   Final  . Microalb, Ur 10/27/2015 2.5* 0.0 - 1.9 mg/dL Final  . Creatinine,U 10/27/2015 232.6  mg/dL Final    . Microalb Creat Ratio 10/27/2015 1.1  0.0 - 30.0 mg/g Final      Medication List       Accurate as of 11/02/15  8:55 PM. Always use your most recent med list.          ACCU-CHEK FASTCLIX LANCETS Misc Use to check blood sugar 2 times per day dx code E11.65   acetaminophen 325 MG tablet Commonly known as:  TYLENOL Take 325 mg by mouth every 6 (six) hours as needed. For pain   albuterol 108 (90 Base) MCG/ACT inhaler Commonly known as:  PROVENTIL HFA;VENTOLIN HFA Inhale 2 puffs into the lungs every 6 (six) hours as needed. For shortness of breath   clobetasol 0.05 % Gel Commonly known as:  TEMOVATE Apply 1 application topically daily as needed. For face.   CRESTOR 5 MG tablet Generic drug:  rosuvastatin TAKE 1 TABLET BY MOUTH EVERY DAY   rosuvastatin 5 MG tablet Commonly known as:  CRESTOR Take 1 tablet (5 mg total) by mouth daily.   ferrous sulfate 325 (65 FE) MG EC tablet Take 50 mg by mouth 3 (three) times daily with meals.   fexofenadine 180 MG tablet Commonly known as:  ALLEGRA Take 180 mg by mouth daily as needed. For allergies   glipiZIDE 5 MG 24 hr tablet Commonly known as:  GLUCOTROL XL TAKE 1 TABLET BY MOUTH EVERY DAY   glucose blood test strip Commonly known as:  ACCU-CHEK GUIDE Use as instructed to check blood sugar 2 times per day dx code E11.65   hydroxypropyl methylcellulose / hypromellose 2.5 % ophthalmic solution Commonly known as:  ISOPTO TEARS / GONIOVISC Place 1 drop into both eyes every other day.   JANUMET 50-1000 MG tablet Generic drug:  sitaGLIPtin-metformin TAKE 1 TABLET BY MOUTH TWICE A DAY WITH A MEAL   levothyroxine 75 MCG tablet Commonly known as:  SYNTHROID, LEVOTHROID TAKE 1 TABLET DAILY BEFORE BREAKFAST   losartan 50 MG tablet Commonly known as:  COZAAR TAKE 1 TABLET BY MOUTH EVERY DAY   pioglitazone 30 MG tablet Commonly known as:  ACTOS Take 1 tablet (30 mg total) by mouth daily.   vitamin B-12 1000 MCG  tablet Commonly known as:  CYANOCOBALAMIN Take 1,000 mcg by mouth daily.  Allergies: No Known Allergies  Past Medical History:  Diagnosis Date  . Asthma   . Diabetes mellitus   . Diverticular disease   . Hypertension   . Subdural hematoma [facility]) May 2013   bilateral     Past Surgical History:  Procedure Laterality Date  . BURR HOLE  05/22/2011   Procedure: Haskell Flirt;  Surgeon: Elaina Hoops, MD;  Location: [place] [facility];  Service: Neurosurgery;  Laterality: Right;  Right Burr Holes for evacuation of subdural hematoma  . BURR HOLE  05/23/2011   Procedure: Haskell Flirt;  Surgeon: Elaina Hoops, MD;  Location: [place] [facility];  Service: Neurosurgery;  Laterality: Left;  Left [facility]  . CRANIOTOMY  06/14/2011   Procedure: CRANIOTOMY HEMATOMA EVACUATION SUBDURAL;  Surgeon: Elaina Hoops, MD;  Location: [place] [facility];  Service: Neurosurgery;  Laterality: N/A;  Left Craniotomy for subdural hematoma  . CRANIOTOMY  06/23/2011   Procedure: CRANIOTOMY HEMATOMA EVACUATION SUBDURAL;  Surgeon: Elaina Hoops, MD;  Location: [place] [facility];  Service: Neurosurgery;  Laterality: Left;  Redo Craniotomy for Subdural Hematoma  . HERNIA REPAIR      Family History  Problem Relation Age of Onset  . Diabetes Mother   . Diabetes Father   . Heart attack Father   . Diabetes Sister   . Diabetes Brother   . Heart attack Brother     Social History:  reports that he has quit smoking. He quit smokeless tobacco use about 37 years ago. He reports that he does not drink alcohol or use drugs.  Review of Systems:  HYPERTENSION:  he has had hypertension for several years   Currently on 50 mg losartan with good control and no lightheadedness.    RENAL dysfunction: His creatinine is mildly increased but stable   Lab Results  Component Value Date   CREATININE 1.41 10/27/2015   CREATININE 1.37 03/30/2015   CREATININE 1.38 07/06/2014    HYPERLIPIDEMIA: The lipid abnormality consists of elevated LDL, well controlled  with 5 mg Crestor His last LDL was 84 in 12/16 done by PCP  Lab Results  Component Value Date   CHOL 147 10/27/2015   HDL 53.40 10/27/2015   LDLCALC 81 10/27/2015   TRIG 63.0 10/27/2015   CHOLHDL 3 10/27/2015    HYPOTHYROIDISM: he has had long-standing mild hypothyroidism since 1995 and since then has been on 75 mcg No complaints of unusual fatigue    No previous history of goiter Thyroid levels as follows:  Lab Results  Component Value Date   TSH 1.01 10/27/2015   TSH 0.51 03/30/2015   TSH 0.69 07/06/2014   FREET4 1.20 10/27/2015   FREET4 1.31 03/30/2015   FREET4 1.15 04/14/2013    No symptoms of numbness in his feet. Diabetic foot exam done in 10/17  He has had anemia which is multifactorial and has been given iron by PCP.  He is taking his B12 supplement for pernicious anemia Hemoglobin is Stable   Lab Results  Component Value Date   WBC 5.3 10/27/2015   HGB 13.0 10/27/2015   HCT 39.2 10/27/2015   MCV 90.6 10/27/2015   PLT 215.0 10/27/2015    PRIMARY HYPOGONADISM:  He has No unusual fatigue Previously had low testosterone level with minimal symptoms and his diagnosis was made in 2/14 with a free T level of 4.8, increased LH of 33 Testosterone was normal more recently  Lab Results  Component Value Date   TESTOSTERONE 311.21 03/30/2015  Examination:   BP 116/66   Pulse 73   Temp 98 F (36.7 C)   Resp 16   Ht 5\' 9"  (1.753 m)   Wt 172 lb 3.2 oz (78.1 kg)   SpO2 98%   BMI 25.43 kg/m   Body mass index is 25.43 kg/m.    No thyroid enlargement present  No ankle edema present  Diabetic Foot Exam - Simple   Simple Foot Form Diabetic Foot exam was performed with the following findings:  Yes   Visual Inspection No deformities, no ulcerations, no other skin breakdown bilaterally:  Yes Sensation Testing Intact to touch and monofilament testing bilaterally:  Yes Pulse Check Posterior Tibialis and Dorsalis pulse intact bilaterally:   Yes Comments      ASSESSMENT/ PLAN:   Diabetes type 2   The patient's diabetes appears to be overall  well controlled with consistently upper normal A1c His readings are fairly good at home but he has checked only a few fasting readings and rarely in the evening His monitor is relatively old and his glucose was 40 mg lower than the lab glucose Will give him a new Accu-Chek meter today He is usually compliant with diet and has lost weight Encouraged him to start exercising and walking more regularly  HYPERTENSION: Blood pressure is  normal on losartan  Renal dysfunction: This is mildly abnormal blood stable, no microalbuminuria  Hypothyroidism: TSH normal, he will continue the same dose, consider reducing the dose if TSH any lower   LIPIDS: excellent control  Anemia: Resolved    Javad Salva 11/02/2015, 8:55 PM

## 2015-11-08 ENCOUNTER — Telehealth: Payer: Self-pay | Admitting: Endocrinology

## 2015-11-08 NOTE — Telephone Encounter (Signed)
I am sorry we do not have any samples at this time

## 2015-11-08 NOTE — Telephone Encounter (Signed)
Insurance will not cover the glucose blood (ACCU-CHEK GUIDE) test strip    Patient ask if she could come to office and pick one up Accu Chek Aviva Plus test strips. Please advise

## 2015-11-26 ENCOUNTER — Emergency Department (HOSPITAL_BASED_OUTPATIENT_CLINIC_OR_DEPARTMENT_OTHER): Payer: No Typology Code available for payment source

## 2015-11-26 ENCOUNTER — Emergency Department (HOSPITAL_BASED_OUTPATIENT_CLINIC_OR_DEPARTMENT_OTHER)
Admission: EM | Admit: 2015-11-26 | Discharge: 2015-11-26 | Disposition: A | Discharge status: Home / Self Care | Payer: No Typology Code available for payment source | Attending: Emergency Medicine | Admitting: Emergency Medicine

## 2015-11-26 ENCOUNTER — Encounter (HOSPITAL_BASED_OUTPATIENT_CLINIC_OR_DEPARTMENT_OTHER): Payer: Self-pay

## 2015-11-26 DIAGNOSIS — S299XXA Unspecified injury of thorax, initial encounter: Secondary | ICD-10-CM | POA: Diagnosis present

## 2015-11-26 DIAGNOSIS — I129 Hypertensive chronic kidney disease with stage 1 through stage 4 chronic kidney disease, or unspecified chronic kidney disease: Secondary | ICD-10-CM | POA: Diagnosis not present

## 2015-11-26 DIAGNOSIS — S20212A Contusion of left front wall of thorax, initial encounter: Secondary | ICD-10-CM | POA: Diagnosis not present

## 2015-11-26 DIAGNOSIS — Z7984 Long term (current) use of oral hypoglycemic drugs: Secondary | ICD-10-CM | POA: Insufficient documentation

## 2015-11-26 DIAGNOSIS — N189 Chronic kidney disease, unspecified: Secondary | ICD-10-CM | POA: Diagnosis not present

## 2015-11-26 DIAGNOSIS — S161XXA Strain of muscle, fascia and tendon at neck level, initial encounter: Secondary | ICD-10-CM | POA: Diagnosis not present

## 2015-11-26 DIAGNOSIS — E1122 Type 2 diabetes mellitus with diabetic chronic kidney disease: Secondary | ICD-10-CM | POA: Insufficient documentation

## 2015-11-26 DIAGNOSIS — M546 Pain in thoracic spine: Secondary | ICD-10-CM | POA: Diagnosis not present

## 2015-11-26 DIAGNOSIS — E039 Hypothyroidism, unspecified: Secondary | ICD-10-CM | POA: Insufficient documentation

## 2015-11-26 DIAGNOSIS — Y92410 Unspecified street and highway as the place of occurrence of the external cause: Secondary | ICD-10-CM | POA: Diagnosis not present

## 2015-11-26 DIAGNOSIS — Z87891 Personal history of nicotine dependence: Secondary | ICD-10-CM | POA: Diagnosis not present

## 2015-11-26 DIAGNOSIS — R0781 Pleurodynia: Secondary | ICD-10-CM | POA: Diagnosis not present

## 2015-11-26 DIAGNOSIS — Y939 Activity, unspecified: Secondary | ICD-10-CM | POA: Insufficient documentation

## 2015-11-26 DIAGNOSIS — Y999 Unspecified external cause status: Secondary | ICD-10-CM | POA: Diagnosis not present

## 2015-11-26 DIAGNOSIS — M542 Cervicalgia: Secondary | ICD-10-CM | POA: Diagnosis not present

## 2015-11-26 DIAGNOSIS — J45909 Unspecified asthma, uncomplicated: Secondary | ICD-10-CM | POA: Diagnosis not present

## 2015-11-26 DIAGNOSIS — S0990XA Unspecified injury of head, initial encounter: Secondary | ICD-10-CM | POA: Diagnosis not present

## 2015-11-26 DIAGNOSIS — R51 Headache: Secondary | ICD-10-CM | POA: Diagnosis not present

## 2015-11-26 DIAGNOSIS — Z79899 Other long term (current) drug therapy: Secondary | ICD-10-CM | POA: Diagnosis not present

## 2015-11-26 LAB — BASIC METABOLIC PANEL
Anion gap: 6 (ref 5–15)
BUN: 28 mg/dL — ABNORMAL HIGH (ref 6–20)
CO2: 26 mmol/L (ref 22–32)
Calcium: 9.6 mg/dL (ref 8.9–10.3)
Chloride: 105 mmol/L (ref 101–111)
Creatinine, Ser: 1.45 mg/dL — ABNORMAL HIGH (ref 0.61–1.24)
GFR calc Af Amer: 52 mL/min — ABNORMAL LOW (ref >60)
GFR calc non Af Amer: 45 mL/min — ABNORMAL LOW (ref >60)
Glucose, Bld: 77 mg/dL (ref 65–99)
Potassium: 4.9 mmol/L (ref 3.5–5.1)
Sodium: 137 mmol/L (ref 135–145)

## 2015-11-26 LAB — CBC WITH DIFFERENTIAL/PLATELET
Basophils Absolute: 0 10*3/uL (ref 0.0–0.1)
Basophils Relative: 0 %
Eosinophils Absolute: 0.1 10*3/uL (ref 0.0–0.7)
Eosinophils Relative: 1 %
HCT: 38.5 % — ABNORMAL LOW (ref 39.0–52.0)
Hemoglobin: 12.3 g/dL — ABNORMAL LOW (ref 13.0–17.0)
Lymphocytes Relative: 12 %
Lymphs Abs: 1.1 10*3/uL (ref 0.7–4.0)
MCH: 30 pg (ref 26.0–34.0)
MCHC: 31.9 g/dL (ref 30.0–36.0)
MCV: 93.9 fL (ref 78.0–100.0)
Monocytes Absolute: 0.8 10*3/uL (ref 0.1–1.0)
Monocytes Relative: 9 %
Neutro Abs: 7 10*3/uL (ref 1.7–7.7)
Neutrophils Relative %: 78 %
Platelets: 215 10*3/uL (ref 150–400)
RBC: 4.1 MIL/uL — ABNORMAL LOW (ref 4.22–5.81)
RDW: 13.2 % (ref 11.5–15.5)
WBC: 8.9 10*3/uL (ref 4.0–10.5)

## 2015-11-26 LAB — CBG MONITORING, ED: Glucose-Capillary: 67 mg/dL (ref 65–99)

## 2015-11-26 MED ORDER — IOPAMIDOL (ISOVUE-370) INJECTION 76%
100.0000 mL | Freq: Once | INTRAVENOUS | Status: AC | PRN
  Administered 2015-11-26: 65 mL via INTRAVENOUS

## 2015-11-26 MED ORDER — TRAMADOL HCL 50 MG PO TABS: 50.0000 mg | ORAL_TABLET | Freq: Four times a day (QID) | ORAL | 0 refills | Status: DC | PRN
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
## 2015-11-26 NOTE — ED Notes (Signed)
Patient transported to CT 
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
## 2015-11-26 NOTE — ED Notes (Signed)
Per pt and son pt has no slurred speech at this time.

## 2015-12-08 DIAGNOSIS — R1032 Left lower quadrant pain: Secondary | ICD-10-CM | POA: Diagnosis not present

## 2015-12-08 DIAGNOSIS — J069 Acute upper respiratory infection, unspecified: Secondary | ICD-10-CM | POA: Diagnosis not present

## 2015-12-15 ENCOUNTER — Other Ambulatory Visit: Payer: Self-pay | Admitting: Endocrinology

## 2015-12-19 ENCOUNTER — Other Ambulatory Visit: Payer: Self-pay | Admitting: Endocrinology

## 2015-12-31 ENCOUNTER — Other Ambulatory Visit: Payer: Self-pay | Admitting: Endocrinology

## 2015-12-31 NOTE — Telephone Encounter (Signed)
Rx sent to [facility] needs to keep 05/2016 appt for further refills

## 2016-01-28 ENCOUNTER — Telehealth: Payer: Self-pay | Admitting: Endocrinology

## 2016-01-28 NOTE — Telephone Encounter (Signed)
If he is having problems with swelling, redness and itching he needs to see his PCP

## 2016-01-28 NOTE — Telephone Encounter (Signed)
Pt will try to get to see his PCP

## 2016-01-28 NOTE — Telephone Encounter (Signed)
Pt has had for the past 5-6 days toes issues with red spots, tingling, and itching on feet. Please advise  No fever, feet are very hot at night, feet swollen

## 2016-02-28 ENCOUNTER — Other Ambulatory Visit: Payer: Self-pay | Admitting: Endocrinology

## 2016-03-17 ENCOUNTER — Ambulatory Visit: Payer: Self-pay | Admitting: Surgery

## 2016-03-17 NOTE — H&P (Signed)
Jarry A Gottschall 03/17/2016 1:30 PM Location: [facility] Patient #: [id number] DOB: [birth year] Married / Language: English / Race: Refused to Report/Unreported Male  History of Present Illness (Maddilynn Esperanza A. 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 He thinks he had it repaired about 15 years ago. He does not low how long the recurrence has been present but started to notice pain a few months ago. He does not have any issues with nausea vomiting, distention or change in bowel habits, no urinary symptoms. He states that the pain is most noticeable in the morning when he wakes up. He's had no prior abdominal surgeries otherwise.  The patient is a 76 year old male.   Past Surgical History Nance Pear, [place]; 03/17/2016 1:31 PM) Colon Polyp Removal - Colonoscopy Laparoscopic Inguinal Hernia Surgery Bilateral. Oral Surgery  Diagnostic Studies History Nance Pear, [place]; 03/17/2016 1:31 PM) Colonoscopy within last year  Allergies Nance Pear, CMA; 03/17/2016 1:31 PM) No Known Drug Allergies 03/17/2016 Allergies Reconciled  Medication History Nance Pear, CMA; 03/17/2016 1:35 PM) Janumet (50-1000MG Tablet, Oral daily) Active. GlipiZIDE ER (5MG Tablet ER 24HR, Oral daily) Active. Rosuvastatin Calcium (5MG Tablet, Oral daily) Active. Clobetasol Propionate (0.05% Gel, External daily) Active. Losartan Potassium (50MG Tablet, Oral daily) Active. Pioglitazone HCl (30MG Tablet, Oral daily) Active. Levothyroxine Sodium (75MCG Tablet, Oral daily) Active. Accu-Chek Active Care Kit Active. Tylenol (325MG Tablet, Oral as needed) Active. Crestor (5MG Tablet, Oral daily) Active. Albuterol Sulfate HFA (108 (90 Base)MCG/ACT Aerosol Soln, Inhalation as needed) Active. Ferrous Gluconate (325MG Tablet, Oral daily) Active. Allegra (180MG Tablet, Oral  daily) Active. Glucose Blood (In Vitro daily) Active. Isopto Tears (0.5% Solution, Ophthalmic daily) Active. Vitamin B 12 (100MCG Lozenge, 1000 Oral daily) Active. Medications Reconciled  Social History Nance Pear, [place]; 03/17/2016 1:31 PM) Alcohol use Remotely quit alcohol use. Caffeine use Coffee, Tea. No drug use Tobacco use Former smoker.  Family History Nance Pear, [place]; 03/17/2016 1:31 PM) Diabetes Mellitus Brother, Father, Mother, Sister. Heart Disease Father, Mother. Hypertension Mother.  Other Problems Nance Pear, [place]; 03/17/2016 1:31 PM) Asthma Diabetes Mellitus Gastroesophageal Reflux Disease High blood pressure Hypercholesterolemia Inguinal Hernia Thyroid Disease Ventral Hernia Repair     Review of Systems Nance Pear CMA; 03/17/2016 1:31 PM) General Not Present- Appetite Loss, Chills, Fatigue, Fever, Night Sweats, Weight Gain and Weight Loss. Skin Not Present- Change in Wart/Mole, Dryness, Hives, Jaundice, New Lesions, Non-Healing Wounds, Rash and Ulcer. HEENT Present- Sore Throat and Wears glasses/contact lenses. Not Present- Earache, Hearing Loss, Hoarseness, Nose Bleed, Oral Ulcers, Ringing in the Ears, Seasonal Allergies, Sinus Pain, Visual Disturbances and Yellow Eyes. Respiratory Present- Snoring and Wheezing. Not Present- Bloody sputum, Chronic Cough and Difficulty Breathing. Breast Not Present- Breast Mass, Breast Pain, Nipple Discharge and Skin Changes. Cardiovascular Present- Leg Cramps. Not Present- Chest Pain, Difficulty Breathing Lying Down, Palpitations, Rapid Heart Rate, Shortness of Breath and Swelling of Extremities. Gastrointestinal Not Present- Abdominal Pain, Bloating, Bloody Stool, Change in Bowel Habits, Chronic diarrhea, Constipation, Difficulty Swallowing, Excessive gas, Gets full quickly at meals, Hemorrhoids, Indigestion, Nausea, Rectal Pain and Vomiting. Male Genitourinary Present- Nocturia and Urgency. Not Present-  Blood in Urine, Change in Urinary Stream, Frequency, Impotence, Painful Urination and Urine Leakage.  Vitals Bary Castilla Bradford CMA; 03/17/2016 1:36 PM) 03/17/2016 1:35 PM Weight: 174 lb Height: 70.5in Body Surface Area: 1.98 m Body Mass Index: 24.61 kg/m  Temp.: 97.71F  Pulse: 71 (Regular)  BP: 112/78 (Sitting, Left Arm, Standard)      Physical Exam (Briea Mcenery A. Kae Heller MD; 03/17/2016 1:50 PM)  General Note: Alert and oriented, appears stated age  Integumentary Note: No lesions or rashes on limited skin exam  Head and Neck Note: Left frontotemporal burr hole scar. Neck No mass or thyromegaly  Eye Note: Anicteric, extra ocular motion intact  ENMT Note: moist mucus membranes, good dentition  Chest and Lung Exam Note: Unlabored respirations, symmetrical air entry  Cardiovascular Note: Regular rate and rhythm, no pedal edema  Abdomen Note: Soft, nontender nondistended. There is a likely indirect hernia on the left side. No hernia on the right side.  Neurologic Note: Grossly normal, normal gait  Neuropsychiatric Note: Normal mood and affect, appropriate insight  Musculoskeletal Note: strength symmetrical throughout, no deformity    Assessment & Plan (Aahil Fredin A. Kae Heller MD; 03/17/2016 1:55 PM)  RECURRENT LEFT INGUINAL HERNIA (K40.91) Story: Thinks this is a recurrence, vaguely recalls having repaired open 15 years ago although I cannot see the scar. Currently moderately symptomatic with pain. No episodes of incarceration. We spent a long time discussing the natural history of hernias and things to watch out for signs of incarceration or strangulation which should prompt him to visit the emergency department. Discussed use of a hernia belt which he does not wish to do at this time, minimizing straining, normal bowel movements. We discussed the difference between laparoscopic and open repair. He is hesitant to have laparoscopic repair due to being worried about  injury to intra-abdominal structures, but I described to him the benefit of laparoscopic surgery and using new tissue planes, in addition to decreased pain and easier recovery compared to the open approach. We discussed that there is a very low risk of other intra-abdominal injury given he has not had prior abdominal surgery. Ultimately I do recommend that he have it repaired and my preference to be laparoscopic. He is currently scheduled to have oral surgery on March 26, which she is eager to get done, and he does not want to schedule his hernia surgery prior to this. He will call in the first week of April to set up a surgery date.

## 2016-03-31 ENCOUNTER — Ambulatory Visit: Payer: Medicare Other | Admitting: Endocrinology

## 2016-05-27 ENCOUNTER — Encounter: Payer: Self-pay | Admitting: Endocrinology

## 2016-05-29 ENCOUNTER — Other Ambulatory Visit (INDEPENDENT_AMBULATORY_CARE_PROVIDER_SITE_OTHER): Payer: Medicare Other

## 2016-05-29 ENCOUNTER — Other Ambulatory Visit: Payer: Self-pay | Admitting: Endocrinology

## 2016-05-29 DIAGNOSIS — E7800 Pure hypercholesterolemia, unspecified: Secondary | ICD-10-CM

## 2016-05-29 DIAGNOSIS — D508 Other iron deficiency anemias: Secondary | ICD-10-CM | POA: Diagnosis not present

## 2016-05-29 DIAGNOSIS — E119 Type 2 diabetes mellitus without complications: Secondary | ICD-10-CM | POA: Diagnosis not present

## 2016-05-29 LAB — COMPREHENSIVE METABOLIC PANEL
ALT: 19 U/L (ref 0–53)
AST: 21 U/L (ref 0–37)
Albumin: 4.3 g/dL (ref 3.5–5.2)
Alkaline Phosphatase: 56 U/L (ref 39–117)
BUN: 22 mg/dL (ref 6–23)
CO2: 28 mEq/L (ref 19–32)
Calcium: 9.6 mg/dL (ref 8.4–10.5)
Chloride: 103 mEq/L (ref 96–112)
Creatinine, Ser: 1.24 mg/dL (ref 0.40–1.50)
GFR: 59.93 mL/min — ABNORMAL LOW (ref >60.00)
Glucose, Bld: 109 mg/dL — ABNORMAL HIGH (ref 70–99)
Potassium: 4.4 mEq/L (ref 3.5–5.1)
Sodium: 135 mEq/L (ref 135–145)
Total Bilirubin: 0.5 mg/dL (ref 0.2–1.2)
Total Protein: 7.1 g/dL (ref 6.0–8.3)

## 2016-05-29 LAB — LIPID PANEL
Cholesterol: 153 mg/dL (ref 0–200)
HDL: 49.1 mg/dL (ref >39.00)
LDL Cholesterol: 86 mg/dL (ref 0–99)
NonHDL: 103.93
Total CHOL/HDL Ratio: 3
Triglycerides: 89 mg/dL (ref 0.0–149.0)
VLDL: 17.8 mg/dL (ref 0.0–40.0)

## 2016-05-29 LAB — TSH: TSH: 0.48 u[IU]/mL (ref 0.35–4.50)

## 2016-05-29 LAB — FERRITIN: Ferritin: 14.8 ng/mL — ABNORMAL LOW (ref 22.0–322.0)

## 2016-05-29 LAB — HEMOGLOBIN A1C: Hgb A1c MFr Bld: 6.4 % (ref 4.6–6.5)

## 2016-05-31 ENCOUNTER — Encounter: Payer: Self-pay | Admitting: Endocrinology

## 2016-05-31 ENCOUNTER — Ambulatory Visit (INDEPENDENT_AMBULATORY_CARE_PROVIDER_SITE_OTHER): Payer: Medicare Other | Admitting: Endocrinology

## 2016-05-31 ENCOUNTER — Other Ambulatory Visit: Payer: Self-pay

## 2016-05-31 VITALS — BP 124/74 | HR 77 | Ht 69.0 in | Wt 171.6 lb

## 2016-05-31 DIAGNOSIS — E119 Type 2 diabetes mellitus without complications: Secondary | ICD-10-CM | POA: Diagnosis not present

## 2016-05-31 DIAGNOSIS — E063 Autoimmune thyroiditis: Secondary | ICD-10-CM

## 2016-05-31 DIAGNOSIS — D508 Other iron deficiency anemias: Secondary | ICD-10-CM | POA: Diagnosis not present

## 2016-05-31 MED ORDER — GLUCOSE BLOOD VI STRP: ORAL_STRIP | 12 refills | Status: DC

## 2016-05-31 MED ORDER — ACCU-CHEK AVIVA PLUS W/DEVICE KIT: PACK | 1 refills | Status: AC

## 2016-05-31 NOTE — Patient Instructions (Addendum)
Skip thyroid pill on Sundays.  Check blood sugars on waking up    Also check blood sugars about 2 hours after a meal and do this after different meals by rotation  Recommended blood sugar levels on waking up is 90-130 and about 2 hours after meal is 130-160  Please bring your blood sugar monitor to each visit, thank you

## 2016-05-31 NOTE — Progress Notes (Signed)
Patient ID: James Juarez, male   DOB: 1940-03-27, 76 y.o.   MRN: [id number]   Reason for Appointment: follow-up   History of Present Illness   Diagnosis: Type 2 DIABETES MELITUS  Oral hypoglycemic drugs: Glipizide ER 5 mg, Actos and Janumet 50/1000 twice a day   He has had long-standing diabetes which has been well-controlled with the same 4 drug regimen which has been unchanged for several years  He is usually is fairly compliant with his diet although because of his oral surgery he is not eating solid foods and sometimes will have milk shakes Weight is the same  A1c is usually upper normal and is stable at 6.4 He has checked blood sugars at home somewhat infrequently   Blood sugars by recall: Fasting 100-102, after meals no higher than about 120 at night His glucose was 109 in the lab  Side effects from medications: None        Monitors blood glucose:  infrequently    Glucometer:  Accu-Chek Aviva His insurance does not cover the Accu-Chek guide    Blood Glucose readings as above :      Diet: Generally low fat and small portions        Physical activity: exercise: Walking 20-30 min, 5/7 days a week              Wt Readings from Last 3 Encounters:  05/31/16 171 lb 9.6 oz (77.8 kg)  11/26/15 170 lb (77.1 kg)  11/02/15 172 lb 3.2 oz (78.1 kg)     He has several other active problems which are discussed in review of systems   LABS:  Lab Results  Component Value Date   HGBA1C 6.4 05/29/2016   HGBA1C 6.4 10/27/2015   HGBA1C 6.6 (H) 03/30/2015   Lab Results  Component Value Date   MICROALBUR 2.5 (H) 10/27/2015   LDLCALC 86 05/29/2016   CREATININE 1.24 05/29/2016     Lab on 05/29/2016  Component Date Value Ref Range Status  . Ferritin 05/29/2016 14.8* 22.0 - 322.0 ng/mL Final  Lab on 05/29/2016  Component Date Value Ref Range Status  . Hgb A1c MFr Bld 05/29/2016 6.4  4.6 - 6.5 % Final   Glycemic Control Guidelines for People with Diabetes:Non Diabetic:   <6%Goal of Therapy: <7%Additional Action Suggested:  >8%   . Sodium 05/29/2016 135  135 - 145 mEq/L Final  . Potassium 05/29/2016 4.4  3.5 - 5.1 mEq/L Final  . Chloride 05/29/2016 103  96 - 112 mEq/L Final  . CO2 05/29/2016 28  19 - 32 mEq/L Final  . Glucose, Bld 05/29/2016 109* 70 - 99 mg/dL Final  . BUN 05/29/2016 22  6 - 23 mg/dL Final  . Creatinine, Ser 05/29/2016 1.24  0.40 - 1.50 mg/dL Final  . Total Bilirubin 05/29/2016 0.5  0.2 - 1.2 mg/dL Final  . Alkaline Phosphatase 05/29/2016 56  39 - 117 U/L Final  . AST 05/29/2016 21  0 - 37 U/L Final  . ALT 05/29/2016 19  0 - 53 U/L Final  . Total Protein 05/29/2016 7.1  6.0 - 8.3 g/dL Final  . Albumin 05/29/2016 4.3  3.5 - 5.2 g/dL Final  . Calcium 05/29/2016 9.6  8.4 - 10.5 mg/dL Final  . GFR 05/29/2016 59.93* >60.00 mL/min Final  . Cholesterol 05/29/2016 153  0 - 200 mg/dL Final   ATP III Classification       Desirable:  < 200 mg/dL  Borderline High:  200 - 239 mg/dL          High:  > = 240 mg/dL  . Triglycerides 05/29/2016 89.0  0.0 - 149.0 mg/dL Final   Normal:  <150 mg/dLBorderline High:  150 - 199 mg/dL  . HDL 05/29/2016 49.10  >39.00 mg/dL Final  . VLDL 05/29/2016 17.8  0.0 - 40.0 mg/dL Final  . LDL Cholesterol 05/29/2016 86  0 - 99 mg/dL Final  . Total CHOL/HDL Ratio 05/29/2016 3   Final                  Men          Women1/2 Average Risk     3.4          3.3Average Risk          5.0          4.42X Average Risk          9.6          7.13X Average Risk          15.0          11.0                      . NonHDL 05/29/2016 103.93   Final   NOTE:  Non-HDL goal should be 30 mg/dL higher than patient's LDL goal (i.e. LDL goal of < 70 mg/dL, would have non-HDL goal of < 100 mg/dL)  . TSH 05/29/2016 0.48  0.35 - 4.50 uIU/mL Final    Allergies as of 05/31/2016   No Known Allergies     Medication List       Accurate as of 05/31/16  1:23 PM. Always use your most recent med list.          ACCU-CHEK FASTCLIX LANCETS  Misc Use to check blood sugar 2 times per day dx code E11.65   acetaminophen 325 MG tablet Commonly known as:  TYLENOL Take 325 mg by mouth every 6 (six) hours as needed. For pain   albuterol 108 (90 Base) MCG/ACT inhaler Commonly known as:  PROVENTIL HFA;VENTOLIN HFA Inhale 2 puffs into the lungs every 6 (six) hours as needed. For shortness of breath   clobetasol 0.05 % Gel Commonly known as:  TEMOVATE Apply 1 application topically daily as needed. For face.   ferrous sulfate 325 (65 FE) MG EC tablet Take 50 mg by mouth 3 (three) times daily with meals.   fexofenadine 180 MG tablet Commonly known as:  ALLEGRA Take 180 mg by mouth daily as needed. For allergies   glipiZIDE 5 MG 24 hr tablet Commonly known as:  GLUCOTROL XL TAKE 1 TABLET BY MOUTH EVERY DAY   glucose blood test strip Commonly known as:  ACCU-CHEK GUIDE Use as instructed to check blood sugar 2 times per day dx code E11.65   hydroxypropyl methylcellulose / hypromellose 2.5 % ophthalmic solution Commonly known as:  ISOPTO TEARS / GONIOVISC Place 1 drop into both eyes every other day.   JANUMET 50-1000 MG tablet Generic drug:  sitaGLIPtin-metformin TAKE 1 TABLET BY MOUTH TWICE A DAY WITH A MEAL   levothyroxine 75 MCG tablet Commonly known as:  SYNTHROID, LEVOTHROID TAKE 1 TABLET BY MOUTH EVERY DAY BEFORE BREAKFAST   losartan 50 MG tablet Commonly known as:  COZAAR TAKE 1 TABLET BY MOUTH EVERY DAY   pioglitazone 30 MG tablet Commonly known as:  ACTOS TAKE 1 TABLET EVERY DAY   rosuvastatin  5 MG tablet Commonly known as:  CRESTOR TAKE 1 TABLET BY MOUTH EVERY DAY   vitamin B-12 1000 MCG tablet Commonly known as:  CYANOCOBALAMIN Take 1,000 mcg by mouth daily.       Allergies: No Known Allergies  Past Medical History:  Diagnosis Date  . Asthma   . Diabetes mellitus   . Diverticular disease   . Hypertension   . Subdural hematoma [facility]) May 2013   bilateral     Past Surgical History:    Procedure Laterality Date  . BURR HOLE  05/22/2011   Procedure: Haskell Flirt;  Surgeon: Elaina Hoops, MD;  Location: [place] [facility];  Service: Neurosurgery;  Laterality: Right;  Right Burr Holes for evacuation of subdural hematoma  . BURR HOLE  05/23/2011   Procedure: Haskell Flirt;  Surgeon: Elaina Hoops, MD;  Location: [place] [facility];  Service: Neurosurgery;  Laterality: Left;  Left [facility]  . CRANIOTOMY  06/14/2011   Procedure: CRANIOTOMY HEMATOMA EVACUATION SUBDURAL;  Surgeon: Elaina Hoops, MD;  Location: [place] [facility];  Service: Neurosurgery;  Laterality: N/A;  Left Craniotomy for subdural hematoma  . CRANIOTOMY  06/23/2011   Procedure: CRANIOTOMY HEMATOMA EVACUATION SUBDURAL;  Surgeon: Elaina Hoops, MD;  Location: [place] [facility];  Service: Neurosurgery;  Laterality: Left;  Redo Craniotomy for Subdural Hematoma  . HERNIA REPAIR      Family History  Problem Relation Age of Onset  . Diabetes Mother   . Diabetes Father   . Heart attack Father   . Diabetes Sister   . Diabetes Brother   . Heart attack Brother     Social History:  reports that he has quit smoking. He quit smokeless tobacco use about 38 years ago. He reports that he does not drink alcohol or use drugs.  Review of Systems:  HYPERTENSION:  he has had hypertension for several years   Currently on 50 mg losartan with good control    RENAL dysfunction: His creatinine is mildly increased In the past but appears better now   Lab Results  Component Value Date   CREATININE 1.24 05/29/2016   CREATININE 1.45 (H) 11/26/2015   CREATININE 1.41 10/27/2015    HYPERLIPIDEMIA: The lipid abnormality consists of elevated LDL, well controlled with 5 mg Crestor LDL is slightly higher but overall the same   Lab Results  Component Value Date   CHOL 153 05/29/2016   HDL 49.10 05/29/2016   LDLCALC 86 05/29/2016   TRIG 89.0 05/29/2016   CHOLHDL 3 05/29/2016    HYPOTHYROIDISM: he has had long-standing mild hypothyroidism since 1995 and since  then has been on 75 mcg Does not complain of fatigue    No previous history of goiter Thyroid levels as follows:  Lab Results  Component Value Date   TSH 0.48 05/29/2016   TSH 1.01 10/27/2015   TSH 0.51 03/30/2015   FREET4 1.20 10/27/2015   FREET4 1.31 03/30/2015   FREET4 1.15 04/14/2013    No symptoms of numbness in his feet. Diabetic foot exam done in 10/17  He has had anemia which is multifactorial and has been given iron by PCP.  Ferritin this still low and he is asking about alternate iron preparation He is taking his B12 supplement for pernicious anemia   Lab Results  Component Value Date   WBC 8.9 11/26/2015   HGB 12.3 (L) 11/26/2015   HCT 38.5 (L) 11/26/2015   MCV 93.9 11/26/2015   PLT 215 11/26/2015  PRIMARY HYPOGONADISM:  He has No unusual fatigue Previously had low testosterone level with minimal symptoms and his diagnosis was made in 2/14 with a free T level of 4.8, increased LH of 33 Testosterone was normal    Lab Results  Component Value Date   TESTOSTERONE 311.21 03/30/2015      Examination:   BP 124/74   Pulse 77   Ht 5\' 9"  (1.753 m)   Wt 171 lb 9.6 oz (77.8 kg)   SpO2 98%   BMI 25.34 kg/m   Body mass index is 25.34 kg/m.       ASSESSMENT/ PLAN:   Diabetes type 2   The patient's diabetes appears to be overall  well controlled with consistently upper normal A1c, now 6.4 His readings are fairly good at home but he did not bring his monitor  His monitor is relatively old and will prescribe new Aviva monitoring Reminded him to check more readings after meals Continue regular walking  HYPERTENSION: Blood pressure is  normal on losartan  Renal dysfunction: This is relatively better  Hypothyroidism: TSH low normal, he will take to Synthroid 75 g, 6 days a week now   LIPIDS: excellent control  Anemia: Followed by PCP     Chrles Selley 05/31/2016, 1:23 PM

## 2016-06-20 ENCOUNTER — Other Ambulatory Visit: Payer: Self-pay | Admitting: Endocrinology

## 2016-06-28 ENCOUNTER — Other Ambulatory Visit: Payer: Self-pay | Admitting: Endocrinology

## 2016-07-19 ENCOUNTER — Ambulatory Visit
Admission: RE | Admit: 2016-07-19 | Discharge: 2016-07-19 | Disposition: A | Discharge status: Home / Self Care | Payer: Medicare Other | Source: Ambulatory Visit | Attending: Family Medicine | Admitting: Family Medicine

## 2016-07-19 ENCOUNTER — Other Ambulatory Visit: Payer: Self-pay | Admitting: Family Medicine

## 2016-07-19 DIAGNOSIS — R059 Cough, unspecified: Secondary | ICD-10-CM

## 2016-07-19 DIAGNOSIS — R05 Cough: Secondary | ICD-10-CM

## 2016-08-18 ENCOUNTER — Other Ambulatory Visit: Payer: Self-pay | Admitting: Endocrinology

## 2016-09-01 ENCOUNTER — Ambulatory Visit: Payer: Self-pay | Admitting: Surgery

## 2016-09-01 NOTE — H&P (Signed)
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 He thinks he had it repaired about 15 years ago. He does not low how long the recurrence has been present but started to notice pain a few months ago. He does not have any issues with nausea vomiting, distention or change in bowel habits, no urinary symptoms. He states that the pain is most noticeable in the morning when he wakes up. He's had no prior abdominal surgeries otherwise. I saw him initially in March at that time he was awaiting some dental surgery which has been completed and for which she is healed at this point. He thinks that the hernia is more painful now that it was when I last saw him.  He has some questions today about the laparoscopic technique.   Past Surgical History  Colon Polyp Removal - Colonoscopy Laparoscopic Inguinal Hernia Surgery Bilateral. Oral Surgery  Diagnostic Studies History  Colonoscopy within last year  Allergies  No Known Drug Allergies 03/17/2016 Allergies Reconciled  Medication History  Janumet (50-1000MG Tablet, Oral daily) Active. GlipiZIDE ER (5MG Tablet ER 24HR, Oral daily) Active. Rosuvastatin Calcium (5MG Tablet, Oral daily) Active. Clobetasol Propionate (0.05% Gel, External daily) Active. Losartan Potassium (50MG Tablet, Oral daily) Active. Pioglitazone HCl (30MG Tablet, Oral daily) Active. Levothyroxine Sodium (75MCG Tablet, Oral daily) Active. Accu-Chek Active Care Kit Active. Tylenol (325MG Tablet, Oral as needed) Active. Crestor (5MG Tablet, Oral daily) Active. Albuterol Sulfate HFA (108 (90 Base)MCG/ACT Aerosol Soln, Inhalation as needed) Active. Ferrous Gluconate (325MG Tablet, Oral daily) Active. Allegra (180MG  Tablet, Oral daily) Active. Glucose Blood (In Vitro daily) Active. Isopto Tears (0.5% Solution, Ophthalmic daily) Active. Vitamin B 12 (100MCG Lozenge, 1000 Oral daily) Active. Medications Reconciled  Social History  Alcohol use Remotely quit alcohol use. Caffeine use Coffee, Tea. No drug use Tobacco use Former smoker.  Family History  Diabetes Mellitus Brother, Father, Mother, Sister. Heart Disease Father, Mother. Hypertension Mother.  Other Problems  Asthma Diabetes Mellitus Gastroesophageal Reflux Disease High blood pressure Hypercholesterolemia Inguinal Hernia Thyroid Disease Ventral Hernia Repair     Review of Systems  General Not Present- Appetite Loss, Chills, Fatigue, Fever, Night Sweats, Weight Gain and Weight Loss. Skin Not Present- Change in Wart/Mole, Dryness, Hives, Jaundice, New Lesions, Non-Healing Wounds, Rash and Ulcer. HEENT Present- Sore Throat and Wears glasses/contact lenses. Not Present- Earache, Hearing Loss, Hoarseness, Nose Bleed, Oral Ulcers, Ringing in the Ears, Seasonal Allergies, Sinus Pain, Visual Disturbances and Yellow Eyes. Respiratory Present- Snoring and Wheezing. Not Present- Bloody sputum, Chronic Cough and Difficulty Breathing. Breast Not Present- Breast Mass, Breast Pain, Nipple Discharge and Skin Changes. Cardiovascular Present- Leg Cramps. Not Present- Chest Pain, Difficulty Breathing Lying Down, Palpitations, Rapid Heart Rate, Shortness of Breath and Swelling of Extremities. Gastrointestinal Not Present- Abdominal Pain, Bloating, Bloody Stool, Change in Bowel Habits, Chronic diarrhea, Constipation, Difficulty Swallowing, Excessive gas, Gets full quickly at meals, Hemorrhoids, Indigestion, Nausea, Rectal Pain and Vomiting. Male Genitourinary Present- Nocturia and Urgency. Not Present- Blood in Urine, Change in Urinary Stream, Frequency, Impotence, Painful Urination and Urine Leakage.  Vitals   Weight: 174  lb Height: 70.5in Body Surface Area: 1.98 m Body Mass Index: 24.61 kg/m  Temp.: 97.35F  Pulse: 71 (Regular)  BP: 112/78 (Sitting, Left Arm, Standard)      Physical Exam  General Note: Alert and oriented, appears stated age  Integumentary Note: No lesions or rashes on limited skin exam  Head and Neck Note: Left frontotemporal burr hole scar. Neck No mass or thyromegaly  Eye Note: Anicteric, extra ocular motion intact  ENMT Note: moist mucus membranes, good dentition  Chest and Lung Exam Note: Unlabored respirations, symmetrical air entry  Cardiovascular Note: Regular rate and rhythm, no pedal edema  Abdomen Note: Soft, nontender nondistended. There is a likely indirect hernia on the left side. No hernia on the right side.  Neurologic Note: Grossly normal, normal gait  Neuropsychiatric Note: Normal mood and affect, appropriate insight  Musculoskeletal Note: strength symmetrical throughout, no deformity    Assessment & Plan   RECURRENT LEFT INGUINAL HERNIA (K40.91) Story: Thinks this is a recurrence, vaguely recalls having repaired open 15 years ago although I cannot see the scar. Currently moderately symptomatic with pain. No episodes of incarceration. We spent a long time discussing the natural history of hernias and things to watch out for signs of incarceration or strangulation which should prompt him to visit the emergency department. Discussed use of a hernia belt which he does not wish to do at this time, minimizing straining, normal bowel movements. We discussed the difference between laparoscopic and open repair. He is hesitant to have laparoscopic repair due to being worried about injury to intra-abdominal structures, but I described to him the benefit of laparoscopic surgery and using new tissue planes, in addition to decreased pain and easier recovery compared to the open approach. We discussed that there is a very  low risk of other intra-abdominal injury given he has not had prior abdominal surgery. Ultimately I do recommend that he have it repaired and my preference to be laparoscopic. I discussed with him the laparoscopic technique, possible inspection and repair of the right side if there is a hernia there.  We discussed risks of surgery once again.  All discussions were answered today.  We'll get him scheduled in the coming weeks.

## 2016-09-08 NOTE — Patient Instructions (Addendum)
James Juarez  09/08/2016   Your procedure is scheduled on: 09-27-16   Report to [facility] Main  Entrance Take [place]  elevators to 3rd floor to  [place] at 8:15 AM.    Call this number if you have problems the morning of surgery [phone]    Remember: ONLY 1 PERSON MAY GO WITH YOU TO SHORT STAY TO GET  READY MORNING OF [place].  Do not eat food or drink liquids :After Midnight.     Take these medicines the morning of surgery with A SIP OF WATER: Levothyroxine (Synthroid). You may also bring and use your inhaler as needed  DO NOT TAKE ANY DIABETIC MEDICATIONS DAY OF YOUR SURGERY                               You may not have any metal on your body including hair pins and              piercings  Do not wear jewelry, make-up, lotions, powders or perfumes, deodorant             Do not wear nail polish.  Do not shave  48 hours prior to surgery.              Men may shave face and neck.   Do not bring valuables to the hospital. [place].  Contacts, dentures or bridgework may not be worn into surgery.       Patients discharged the day of surgery will not be allowed to drive home.  Name and phone number of your driver:  Special Instructions: James Juarez [phone]              Please read over the following fact sheets you were given: _____________________________________________________________________  How to Manage Your Diabetes Before and After Surgery  Why is it important to control my blood sugar before and after surgery? . Improving blood sugar levels before and after surgery helps healing and can limit problems. . A way of improving blood sugar control is eating a healthy diet by: o  Eating less sugar and carbohydrates o  Increasing activity/exercise o  Talking with your doctor about reaching your blood sugar goals . High blood sugars (greater than 180 mg/dL) can raise your risk of  infections and slow your recovery, so you will need to focus on controlling your diabetes during the weeks before surgery. . Make sure that the doctor who takes care of your diabetes knows about your planned surgery including the date and location.  How do I manage my blood sugar before surgery? . Check your blood sugar at least 4 times a day, starting 2 days before surgery, to make sure that the level is not too high or low. o Check your blood sugar the morning of your surgery when you wake up and every 2 hours until you get to the Short Stay unit. . If your blood sugar is less than 70 mg/dL, you will need to treat for low blood sugar: o Do not take insulin. o Treat a low blood sugar (less than 70 mg/dL) with  cup of clear juice (cranberry or apple), 4 glucose tablets, OR glucose gel. o Recheck blood sugar in  15 minutes after treatment (to make sure it is greater than 70 mg/dL). If your blood sugar is not greater than 70 mg/dL on recheck, call [phone] for further instructions. . Report your blood sugar to the short stay nurse when you get to Short Stay.  . If you are admitted to the hospital after surgery: o Your blood sugar will be checked by the staff and you will probably be given insulin after surgery (instead of oral diabetes medicines) to make sure you have good blood sugar levels. o The goal for blood sugar control after surgery is 80-180 mg/dL.   WHAT DO I DO ABOUT MY DIABETES MEDICATION?  [name] Kitchen Do not take oral diabetes medicines (pills) the morning of surgery.  . THE DAY BEFORE SURGERY, your usual dose of Janumet, Actos, and morning dose of Glipizide.              [facility] - Preparing for Surgery Before surgery, you can play an important role.  Because skin is not sterile, your skin needs to be as free of germs as possible.  You can reduce the number of germs on your skin by washing with CHG (chlorahexidine gluconate) soap before surgery.  CHG is an antiseptic cleaner which  kills germs and bonds with the skin to continue killing germs even after washing. Please DO NOT use if you have an allergy to CHG or antibacterial soaps.  If your skin becomes reddened/irritated stop using the CHG and inform your nurse when you arrive at Short Stay. Do not shave (including legs and underarms) for at least 48 hours prior to the first CHG shower.  You may shave your face/neck. Please follow these instructions carefully:  1.  Shower with CHG Soap the night before surgery and the  morning of Surgery.  2.  If you choose to wash your hair, wash your hair first as usual with your  normal  shampoo.  3.  After you shampoo, rinse your hair and body thoroughly to remove the  shampoo.                           4.  Use CHG as you would any other liquid soap.  You can apply chg directly  to the skin and wash                       Gently with a scrungie or clean washcloth.  5.  Apply the CHG Soap to your body ONLY FROM THE NECK DOWN.   Do not use on face/ open                           Wound or open sores. Avoid contact with eyes, ears mouth and genitals (private parts).                       Wash face,  Genitals (private parts) with your normal soap.             6.  Wash thoroughly, paying special attention to the area where your surgery  will be performed.  7.  Thoroughly rinse your body with warm water from the neck down.  8.  DO NOT shower/wash with your normal soap after using and rinsing off  the CHG Soap.                9.  James Juarez  yourself dry with a clean towel.            10.  Wear clean pajamas.            11.  Place clean sheets on your bed the night of your first shower and do not  sleep with pets. Day of Surgery : Do not apply any lotions/deodorants the morning of surgery.  Please wear clean clothes to the hospital/surgery center.  FAILURE TO FOLLOW THESE INSTRUCTIONS MAY RESULT IN THE CANCELLATION OF YOUR SURGERY PATIENT SIGNATURE_________________________________  NURSE  SIGNATURE__________________________________  ________________________________________________________________________

## 2016-09-08 NOTE — Progress Notes (Signed)
07-19-16 (EPIC) CXR  11-26-15 (EPIC) EKG

## 2016-09-13 ENCOUNTER — Encounter (HOSPITAL_COMMUNITY): Payer: Self-pay

## 2016-09-13 ENCOUNTER — Encounter (HOSPITAL_COMMUNITY)
Admission: RE | Admit: 2016-09-13 | Discharge: 2016-09-13 | Disposition: A | Discharge status: Home / Self Care | Payer: Medicare Other | Source: Ambulatory Visit | Attending: Surgery | Admitting: Surgery

## 2016-09-13 DIAGNOSIS — E119 Type 2 diabetes mellitus without complications: Secondary | ICD-10-CM | POA: Diagnosis not present

## 2016-09-13 DIAGNOSIS — Z01810 Encounter for preprocedural cardiovascular examination: Secondary | ICD-10-CM | POA: Insufficient documentation

## 2016-09-13 DIAGNOSIS — E7800 Pure hypercholesterolemia, unspecified: Secondary | ICD-10-CM | POA: Diagnosis not present

## 2016-09-13 DIAGNOSIS — K4090 Unilateral inguinal hernia, without obstruction or gangrene, not specified as recurrent: Secondary | ICD-10-CM | POA: Insufficient documentation

## 2016-09-13 DIAGNOSIS — Z01812 Encounter for preprocedural laboratory examination: Secondary | ICD-10-CM | POA: Insufficient documentation

## 2016-09-13 DIAGNOSIS — I10 Essential (primary) hypertension: Secondary | ICD-10-CM | POA: Insufficient documentation

## 2016-09-13 LAB — BASIC METABOLIC PANEL
Anion gap: 4 — ABNORMAL LOW (ref 5–15)
BUN: 27 mg/dL — ABNORMAL HIGH (ref 6–20)
CO2: 30 mmol/L (ref 22–32)
Calcium: 9.6 mg/dL (ref 8.9–10.3)
Chloride: 102 mmol/L (ref 101–111)
Creatinine, Ser: 1.46 mg/dL — ABNORMAL HIGH (ref 0.61–1.24)
GFR calc Af Amer: 51 mL/min — ABNORMAL LOW (ref >60)
GFR calc non Af Amer: 44 mL/min — ABNORMAL LOW (ref >60)
Glucose, Bld: 131 mg/dL — ABNORMAL HIGH (ref 65–99)
Potassium: 4.7 mmol/L (ref 3.5–5.1)
Sodium: 136 mmol/L (ref 135–145)

## 2016-09-13 LAB — CBC
HCT: 36.3 % — ABNORMAL LOW (ref 39.0–52.0)
Hemoglobin: 12.1 g/dL — ABNORMAL LOW (ref 13.0–17.0)
MCH: 30.3 pg (ref 26.0–34.0)
MCHC: 33.3 g/dL (ref 30.0–36.0)
MCV: 90.8 fL (ref 78.0–100.0)
Platelets: 226 10*3/uL (ref 150–400)
RBC: 4 MIL/uL — ABNORMAL LOW (ref 4.22–5.81)
RDW: 14.1 % (ref 11.5–15.5)
WBC: 5.4 10*3/uL (ref 4.0–10.5)

## 2016-09-13 LAB — GLUCOSE, CAPILLARY: Glucose-Capillary: 187 mg/dL — ABNORMAL HIGH (ref 65–99)

## 2016-09-13 LAB — HEMOGLOBIN A1C
Hgb A1c MFr Bld: 5.9 % — ABNORMAL HIGH (ref 4.8–5.6)
Mean Plasma Glucose: 122.63 mg/dL

## 2016-09-13 NOTE — Progress Notes (Signed)
09-13-16 BMP result routed to Dr. Kae Heller for review

## 2016-09-14 ENCOUNTER — Ambulatory Visit: Payer: Medicare Other | Admitting: Podiatry

## 2016-09-21 ENCOUNTER — Ambulatory Visit (INDEPENDENT_AMBULATORY_CARE_PROVIDER_SITE_OTHER): Payer: Medicare Other | Admitting: Podiatry

## 2016-09-21 ENCOUNTER — Encounter: Payer: Self-pay | Admitting: Podiatry

## 2016-09-21 ENCOUNTER — Ambulatory Visit (INDEPENDENT_AMBULATORY_CARE_PROVIDER_SITE_OTHER): Payer: Medicare Other

## 2016-09-21 DIAGNOSIS — M722 Plantar fascial fibromatosis: Secondary | ICD-10-CM

## 2016-09-21 MED ORDER — TRIAMCINOLONE ACETONIDE 10 MG/ML IJ SUSP
10.0000 mg | Freq: Once | INTRAMUSCULAR | Status: AC
  Administered 2016-09-21: 10 mg

## 2016-09-21 NOTE — Patient Instructions (Signed)

## 2016-09-21 NOTE — Progress Notes (Signed)
Subjective:    Patient ID: James Juarez, male   DOB: 76 y.o.   MRN: [id number]   HPI patient states my heel has been hurting mostly in the left and it's been bothering me now for several months with the right bothering me but not to the same degree    ROS      Objective:  Physical Exam neurovascular status intact negative Homans sign was noted with patient found have exquisite discomfort plantar aspect left heel with depression of the arch and moderate discomfort in the right heel     Assessment:   Acute plantar fasciitis left heel at the insertional point tendon calcaneus with mild discomfort in the right      Plan:   H&P x-rays reviewed condition discussed and injected the left plantar fascia 3 mg Kenalog 5 mill grams Xylocaine and applied fascial brace to lift the arch. Gave instructions on physical therapy supportive shoe gear usage and reappoint to recheck  X-rays indicate there is spur formation with no indication of stress fracture arthritis

## 2016-09-27 ENCOUNTER — Ambulatory Visit (HOSPITAL_COMMUNITY): Payer: Medicare Other

## 2016-09-27 ENCOUNTER — Encounter (HOSPITAL_COMMUNITY)
Admission: RE | Disposition: A | Discharge status: Home / Self Care | Payer: Self-pay | Source: Ambulatory Visit | Attending: Surgery

## 2016-09-27 ENCOUNTER — Encounter (HOSPITAL_COMMUNITY): Payer: Self-pay | Admitting: Emergency Medicine

## 2016-09-27 ENCOUNTER — Ambulatory Visit (HOSPITAL_COMMUNITY)
Admission: RE | Admit: 2016-09-27 | Discharge: 2016-09-27 | Disposition: A | Discharge status: Home / Self Care | Payer: Medicare Other | Source: Ambulatory Visit | Attending: Surgery | Admitting: Surgery

## 2016-09-27 DIAGNOSIS — K219 Gastro-esophageal reflux disease without esophagitis: Secondary | ICD-10-CM | POA: Insufficient documentation

## 2016-09-27 DIAGNOSIS — I10 Essential (primary) hypertension: Secondary | ICD-10-CM | POA: Insufficient documentation

## 2016-09-27 DIAGNOSIS — Z833 Family history of diabetes mellitus: Secondary | ICD-10-CM | POA: Insufficient documentation

## 2016-09-27 DIAGNOSIS — J45909 Unspecified asthma, uncomplicated: Secondary | ICD-10-CM | POA: Insufficient documentation

## 2016-09-27 DIAGNOSIS — Z87891 Personal history of nicotine dependence: Secondary | ICD-10-CM | POA: Diagnosis not present

## 2016-09-27 DIAGNOSIS — D176 Benign lipomatous neoplasm of spermatic cord: Secondary | ICD-10-CM | POA: Insufficient documentation

## 2016-09-27 DIAGNOSIS — Z79899 Other long term (current) drug therapy: Secondary | ICD-10-CM | POA: Diagnosis not present

## 2016-09-27 DIAGNOSIS — Z86010 Personal history of colonic polyps: Secondary | ICD-10-CM | POA: Insufficient documentation

## 2016-09-27 DIAGNOSIS — K4091 Unilateral inguinal hernia, without obstruction or gangrene, recurrent: Secondary | ICD-10-CM | POA: Insufficient documentation

## 2016-09-27 DIAGNOSIS — Z8249 Family history of ischemic heart disease and other diseases of the circulatory system: Secondary | ICD-10-CM | POA: Diagnosis not present

## 2016-09-27 DIAGNOSIS — Z7984 Long term (current) use of oral hypoglycemic drugs: Secondary | ICD-10-CM | POA: Insufficient documentation

## 2016-09-27 DIAGNOSIS — E7800 Pure hypercholesterolemia, unspecified: Secondary | ICD-10-CM | POA: Insufficient documentation

## 2016-09-27 DIAGNOSIS — E039 Hypothyroidism, unspecified: Secondary | ICD-10-CM | POA: Insufficient documentation

## 2016-09-27 DIAGNOSIS — E119 Type 2 diabetes mellitus without complications: Secondary | ICD-10-CM | POA: Insufficient documentation

## 2016-09-27 DIAGNOSIS — D649 Anemia, unspecified: Secondary | ICD-10-CM | POA: Insufficient documentation

## 2016-09-27 HISTORY — PX: INSERTION OF MESH: SHX5868

## 2016-09-27 HISTORY — PX: INGUINAL HERNIA REPAIR: SHX194

## 2016-09-27 LAB — GLUCOSE, CAPILLARY
Glucose-Capillary: 104 mg/dL — ABNORMAL HIGH (ref 65–99)
Glucose-Capillary: 150 mg/dL — ABNORMAL HIGH (ref 65–99)
Glucose-Capillary: 165 mg/dL — ABNORMAL HIGH (ref 65–99)

## 2016-09-27 SURGERY — REPAIR, HERNIA, INGUINAL, BILATERAL, LAPAROSCOPIC
Anesthesia: General | Laterality: Left

## 2016-09-27 MED ORDER — 0.9 % SODIUM CHLORIDE (POUR BTL) OPTIME
TOPICAL | Status: DC | PRN
  Administered 2016-09-27: 1000 mL

## 2016-09-27 MED ORDER — PROMETHAZINE HCL 25 MG/ML IJ SOLN: 6.2500 mg | INTRAMUSCULAR | Status: DC | PRN

## 2016-09-27 MED ORDER — SODIUM CHLORIDE 0.9 % IJ SOLN
INTRAMUSCULAR | Status: AC
  Filled 2016-09-27: qty 10

## 2016-09-27 MED ORDER — SUGAMMADEX SODIUM 200 MG/2ML IV SOLN
INTRAVENOUS | Status: AC
  Filled 2016-09-27: qty 2

## 2016-09-27 MED ORDER — CELECOXIB 200 MG PO CAPS
400.0000 mg | ORAL_CAPSULE | ORAL | Status: AC
  Administered 2016-09-27: 400 mg via ORAL
  Filled 2016-09-27: qty 2

## 2016-09-27 MED ORDER — HYDROMORPHONE HCL-NACL 0.5-0.9 MG/ML-% IV SOSY
PREFILLED_SYRINGE | INTRAVENOUS | Status: AC
  Filled 2016-09-27: qty 2

## 2016-09-27 MED ORDER — GABAPENTIN 300 MG PO CAPS
300.0000 mg | ORAL_CAPSULE | ORAL | Status: AC
  Administered 2016-09-27: 300 mg via ORAL
  Filled 2016-09-27: qty 1

## 2016-09-27 MED ORDER — LACTATED RINGERS IV SOLN
INTRAVENOUS | Status: DC | PRN
  Administered 2016-09-27: 09:00:00 via INTRAVENOUS

## 2016-09-27 MED ORDER — SODIUM CHLORIDE 0.9% FLUSH: 3.0000 mL | Freq: Two times a day (BID) | INTRAVENOUS | Status: DC

## 2016-09-27 MED ORDER — FENTANYL CITRATE (PF) 100 MCG/2ML IJ SOLN
INTRAMUSCULAR | Status: DC | PRN
  Administered 2016-09-27 (×2): 50 ug via INTRAVENOUS

## 2016-09-27 MED ORDER — SODIUM CHLORIDE 0.9% FLUSH: 3.0000 mL | INTRAVENOUS | Status: DC | PRN

## 2016-09-27 MED ORDER — CHLORHEXIDINE GLUCONATE 4 % EX LIQD: 60.0000 mL | Freq: Once | CUTANEOUS | Status: DC

## 2016-09-27 MED ORDER — ACETAMINOPHEN 650 MG RE SUPP
650.0000 mg | RECTAL | Status: DC | PRN
  Filled 2016-09-27: qty 1

## 2016-09-27 MED ORDER — HYDROMORPHONE HCL-NACL 0.5-0.9 MG/ML-% IV SOSY
0.2500 mg | PREFILLED_SYRINGE | INTRAVENOUS | Status: DC | PRN
  Administered 2016-09-27 (×4): 0.25 mg via INTRAVENOUS

## 2016-09-27 MED ORDER — FENTANYL CITRATE (PF) 100 MCG/2ML IJ SOLN: 25.0000 ug | INTRAMUSCULAR | Status: DC | PRN

## 2016-09-27 MED ORDER — ROCURONIUM BROMIDE 10 MG/ML (PF) SYRINGE
PREFILLED_SYRINGE | INTRAVENOUS | Status: DC | PRN
  Administered 2016-09-27: 50 mg via INTRAVENOUS

## 2016-09-27 MED ORDER — EPHEDRINE 5 MG/ML INJ
INTRAVENOUS | Status: AC
  Filled 2016-09-27: qty 10

## 2016-09-27 MED ORDER — MEPERIDINE HCL 50 MG/ML IJ SOLN: 6.2500 mg | INTRAMUSCULAR | Status: DC | PRN

## 2016-09-27 MED ORDER — FENTANYL CITRATE (PF) 100 MCG/2ML IJ SOLN
INTRAMUSCULAR | Status: AC
  Filled 2016-09-27: qty 2

## 2016-09-27 MED ORDER — LIDOCAINE 2% (20 MG/ML) 5 ML SYRINGE
INTRAMUSCULAR | Status: DC | PRN
  Administered 2016-09-27: 1 mg/kg/h via INTRAVENOUS

## 2016-09-27 MED ORDER — EPHEDRINE SULFATE-NACL 50-0.9 MG/10ML-% IV SOSY
PREFILLED_SYRINGE | INTRAVENOUS | Status: DC | PRN
  Administered 2016-09-27 (×2): 10 mg via INTRAVENOUS

## 2016-09-27 MED ORDER — DOCUSATE SODIUM 100 MG PO CAPS: 100.0000 mg | ORAL_CAPSULE | Freq: Two times a day (BID) | ORAL | 0 refills | Status: AC

## 2016-09-27 MED ORDER — PHENYLEPHRINE 40 MCG/ML (10ML) SYRINGE FOR IV PUSH (FOR BLOOD PRESSURE SUPPORT)
PREFILLED_SYRINGE | INTRAVENOUS | Status: DC | PRN
  Administered 2016-09-27 (×3): 80 ug via INTRAVENOUS

## 2016-09-27 MED ORDER — PROPOFOL 10 MG/ML IV BOLUS
INTRAVENOUS | Status: DC | PRN
  Administered 2016-09-27: 180 mg via INTRAVENOUS

## 2016-09-27 MED ORDER — OXYCODONE HCL 5 MG PO TABS
5.0000 mg | ORAL_TABLET | ORAL | Status: DC | PRN
Start: 2016-09-27 — End: 2016-09-27

## 2016-09-27 MED ORDER — SUGAMMADEX SODIUM 200 MG/2ML IV SOLN
INTRAVENOUS | Status: DC | PRN
  Administered 2016-09-27: 200 mg via INTRAVENOUS

## 2016-09-27 MED ORDER — ACETAMINOPHEN 325 MG PO TABS: 650.0000 mg | ORAL_TABLET | ORAL | Status: DC | PRN

## 2016-09-27 MED ORDER — PHENYLEPHRINE HCL 10 MG/ML IJ SOLN
INTRAMUSCULAR | Status: AC
  Filled 2016-09-27: qty 5

## 2016-09-27 MED ORDER — SODIUM CHLORIDE 0.9 % IV SOLN: 250.0000 mL | INTRAVENOUS | Status: DC | PRN

## 2016-09-27 MED ORDER — SODIUM CHLORIDE 0.9 % IJ SOLN
INTRAMUSCULAR | Status: DC | PRN
  Administered 2016-09-27: 10 mL

## 2016-09-27 MED ORDER — HYDROCODONE-ACETAMINOPHEN 5-325 MG PO TABS: 1.0000 | ORAL_TABLET | Freq: Four times a day (QID) | ORAL | 0 refills | Status: DC | PRN

## 2016-09-27 MED ORDER — BUPIVACAINE LIPOSOME 1.3 % IJ SUSP
20.0000 mL | Freq: Once | INTRAMUSCULAR | Status: AC
  Administered 2016-09-27: 20 mL
  Filled 2016-09-27: qty 20

## 2016-09-27 MED ORDER — ACETAMINOPHEN 500 MG PO TABS
1000.0000 mg | ORAL_TABLET | ORAL | Status: AC
  Administered 2016-09-27: 1000 mg via ORAL
  Filled 2016-09-27: qty 2

## 2016-09-27 MED ORDER — PROPOFOL 10 MG/ML IV BOLUS
INTRAVENOUS | Status: AC
  Filled 2016-09-27: qty 40

## 2016-09-27 MED ORDER — PHENYLEPHRINE 40 MCG/ML (10ML) SYRINGE FOR IV PUSH (FOR BLOOD PRESSURE SUPPORT)
PREFILLED_SYRINGE | INTRAVENOUS | Status: AC
  Filled 2016-09-27: qty 10

## 2016-09-27 MED ORDER — LIDOCAINE 2% (20 MG/ML) 5 ML SYRINGE
INTRAMUSCULAR | Status: DC | PRN
  Administered 2016-09-27: 40 mg via INTRAVENOUS

## 2016-09-27 MED ORDER — CEFAZOLIN SODIUM-DEXTROSE 2-4 GM/100ML-% IV SOLN
2.0000 g | INTRAVENOUS | Status: AC
  Administered 2016-09-27: 2 g via INTRAVENOUS
  Filled 2016-09-27: qty 100

## 2016-09-27 SURGICAL SUPPLY — 31 items
ADH SKN CLS APL DERMABOND .7 (GAUZE/BANDAGES/DRESSINGS) ×1
CABLE HIGH FREQUENCY MONO STRZ (ELECTRODE) ×2 IMPLANT
CHLORAPREP W/TINT 26ML (MISCELLANEOUS) ×2 IMPLANT
COVER SURGICAL LIGHT HANDLE (MISCELLANEOUS) ×2 IMPLANT
DECANTER SPIKE VIAL GLASS SM (MISCELLANEOUS) ×2 IMPLANT
DERMABOND ADVANCED (GAUZE/BANDAGES/DRESSINGS) ×1
DERMABOND ADVANCED .7 DNX12 (GAUZE/BANDAGES/DRESSINGS) ×1 IMPLANT
DEVICE SECURE STRAP 25 ABSORB (INSTRUMENTS) ×2 IMPLANT
ELECT REM PT RETURN 15FT ADLT (MISCELLANEOUS) ×2 IMPLANT
GLOVE BIO SURGEON STRL SZ 6 (GLOVE) ×2 IMPLANT
GLOVE INDICATOR 6.5 STRL GRN (GLOVE) ×2 IMPLANT
GOWN STRL REUS W/TWL LRG LVL3 (GOWN DISPOSABLE) ×2 IMPLANT
GOWN STRL REUS W/TWL XL LVL3 (GOWN DISPOSABLE) ×4 IMPLANT
GRASPER SUT TROCAR 14GX15 (MISCELLANEOUS) IMPLANT
IRRIG SUCT STRYKERFLOW 2 WTIP (MISCELLANEOUS) ×2
IRRIGATION SUCT STRKRFLW 2 WTP (MISCELLANEOUS) IMPLANT
KIT BASIN OR (CUSTOM PROCEDURE TRAY) ×2 IMPLANT
MESH 3DMAX LIGHT 4.1X6.2 LT LR (Mesh General) ×1 IMPLANT
NDL INSUFFLATION 14GA 120MM (NEEDLE) ×1 IMPLANT
NEEDLE INSUFFLATION 14GA 120MM (NEEDLE) ×2 IMPLANT
SCISSORS LAP 5X35 DISP (ENDOMECHANICALS) ×2 IMPLANT
SLEEVE XCEL OPT CAN 5 100 (ENDOMECHANICALS) ×2 IMPLANT
SUT MNCRL AB 4-0 PS2 18 (SUTURE) ×2 IMPLANT
TOWEL OR 17X26 10 PK STRL BLUE (TOWEL DISPOSABLE) ×2 IMPLANT
TOWEL OR NON WOVEN STRL DISP B (DISPOSABLE) ×1 IMPLANT
TRAY FOLEY CATH SILVER 14FR (SET/KITS/TRAYS/PACK) IMPLANT
TRAY FOLEY W/METER SILVER 16FR (SET/KITS/TRAYS/PACK) ×1 IMPLANT
TRAY LAPAROSCOPIC (CUSTOM PROCEDURE TRAY) ×2 IMPLANT
TROCAR BLADELESS OPT 5 100 (ENDOMECHANICALS) ×2 IMPLANT
TROCAR XCEL 12X100 BLDLESS (ENDOMECHANICALS) ×2 IMPLANT
TUBING INSUF HEATED (TUBING) ×2 IMPLANT

## 2016-09-27 NOTE — Progress Notes (Signed)
Pt ambulated down hall from room 1301-1310 x4 with steady gait, tolerated well. Pt will attempt to urinate in approximately 15 minutes.

## 2016-09-27 NOTE — H&P (View-Only) (Signed)
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 He thinks he had it repaired about 15 years ago. He does not low how long the recurrence has been present but started to notice pain a few months ago. He does not have any issues with nausea vomiting, distention or change in bowel habits, no urinary symptoms. He states that the pain is most noticeable in the morning when he wakes up. He's had no prior abdominal surgeries otherwise. I saw him initially in March at that time he was awaiting some dental surgery which has been completed and for which she is healed at this point. He thinks that the hernia is more painful now that it was when I last saw him.  He has some questions today about the laparoscopic technique.   Past Surgical History  Colon Polyp Removal - Colonoscopy Laparoscopic Inguinal Hernia Surgery Bilateral. Oral Surgery  Diagnostic Studies History  Colonoscopy within last year  Allergies  No Known Drug Allergies 03/17/2016 Allergies Reconciled  Medication History  Janumet (50-1000MG Tablet, Oral daily) Active. GlipiZIDE ER (5MG Tablet ER 24HR, Oral daily) Active. Rosuvastatin Calcium (5MG Tablet, Oral daily) Active. Clobetasol Propionate (0.05% Gel, External daily) Active. Losartan Potassium (50MG Tablet, Oral daily) Active. Pioglitazone HCl (30MG Tablet, Oral daily) Active. Levothyroxine Sodium (75MCG Tablet, Oral daily) Active. Accu-Chek Active Care Kit Active. Tylenol (325MG Tablet, Oral as needed) Active. Crestor (5MG Tablet, Oral daily) Active. Albuterol Sulfate HFA (108 (90 Base)MCG/ACT Aerosol Soln, Inhalation as needed) Active. Ferrous Gluconate (325MG Tablet, Oral daily) Active. Allegra (180MG  Tablet, Oral daily) Active. Glucose Blood (In Vitro daily) Active. Isopto Tears (0.5% Solution, Ophthalmic daily) Active. Vitamin B 12 (100MCG Lozenge, 1000 Oral daily) Active. Medications Reconciled  Social History  Alcohol use Remotely quit alcohol use. Caffeine use Coffee, Tea. No drug use Tobacco use Former smoker.  Family History  Diabetes Mellitus Brother, Father, Mother, Sister. Heart Disease Father, Mother. Hypertension Mother.  Other Problems  Asthma Diabetes Mellitus Gastroesophageal Reflux Disease High blood pressure Hypercholesterolemia Inguinal Hernia Thyroid Disease Ventral Hernia Repair     Review of Systems  General Not Present- Appetite Loss, Chills, Fatigue, Fever, Night Sweats, Weight Gain and Weight Loss. Skin Not Present- Change in Wart/Mole, Dryness, Hives, Jaundice, New Lesions, Non-Healing Wounds, Rash and Ulcer. HEENT Present- Sore Throat and Wears glasses/contact lenses. Not Present- Earache, Hearing Loss, Hoarseness, Nose Bleed, Oral Ulcers, Ringing in the Ears, Seasonal Allergies, Sinus Pain, Visual Disturbances and Yellow Eyes. Respiratory Present- Snoring and Wheezing. Not Present- Bloody sputum, Chronic Cough and Difficulty Breathing. Breast Not Present- Breast Mass, Breast Pain, Nipple Discharge and Skin Changes. Cardiovascular Present- Leg Cramps. Not Present- Chest Pain, Difficulty Breathing Lying Down, Palpitations, Rapid Heart Rate, Shortness of Breath and Swelling of Extremities. Gastrointestinal Not Present- Abdominal Pain, Bloating, Bloody Stool, Change in Bowel Habits, Chronic diarrhea, Constipation, Difficulty Swallowing, Excessive gas, Gets full quickly at meals, Hemorrhoids, Indigestion, Nausea, Rectal Pain and Vomiting. Male Genitourinary Present- Nocturia and Urgency. Not Present- Blood in Urine, Change in Urinary Stream, Frequency, Impotence, Painful Urination and Urine Leakage.  Vitals   Weight: 174  lb Height: 70.5in Body Surface Area: 1.98 m Body Mass Index: 24.61 kg/m  Temp.: 97.4F  Pulse: 71 (Regular)  BP: 112/78 (Sitting, Left Arm, Standard)      Physical Exam  General Note: Alert and oriented, appears stated age  Integumentary Note: No lesions or rashes on limited skin exam  Head and Neck Note: Left frontotemporal burr hole scar. Neck No mass or thyromegaly  Eye Note: Anicteric, extra ocular motion intact  ENMT Note: moist mucus membranes, good dentition  Chest and Lung Exam Note: Unlabored respirations, symmetrical air entry  Cardiovascular Note: Regular rate and rhythm, no pedal edema  Abdomen Note: Soft, nontender nondistended. There is a likely indirect hernia on the left side. No hernia on the right side.  Neurologic Note: Grossly normal, normal gait  Neuropsychiatric Note: Normal mood and affect, appropriate insight  Musculoskeletal Note: strength symmetrical throughout, no deformity    Assessment & Plan   RECURRENT LEFT INGUINAL HERNIA (K40.91) Story: Thinks this is a recurrence, vaguely recalls having repaired open 15 years ago although I cannot see the scar. Currently moderately symptomatic with pain. No episodes of incarceration. We spent a long time discussing the natural history of hernias and things to watch out for signs of incarceration or strangulation which should prompt him to visit the emergency department. Discussed use of a hernia belt which he does not wish to do at this time, minimizing straining, normal bowel movements. We discussed the difference between laparoscopic and open repair. He is hesitant to have laparoscopic repair due to being worried about injury to intra-abdominal structures, but I described to him the benefit of laparoscopic surgery and using new tissue planes, in addition to decreased pain and easier recovery compared to the open approach. We discussed that there is a very  low risk of other intra-abdominal injury given he has not had prior abdominal surgery. Ultimately I do recommend that he have it repaired and my preference to be laparoscopic. I discussed with him the laparoscopic technique, possible inspection and repair of the right side if there is a hernia there.  We discussed risks of surgery once again.  All discussions were answered today.  We'll get him scheduled in the coming weeks.

## 2016-09-27 NOTE — Op Note (Signed)
Operative Note  James Juarez  [id number]  [id number]  09/27/2016   Surgeon: Victorino Sparrow ConnorMD  Assistant: OR staff  Procedure performed: laparoscopic transabdominal preperitoneal left inguinal hernia repair with Bard 3DMax light large mesh, vicryl tacks  Preop diagnosis: Recurrent left inguinal hernia Post-op diagnosis/intraop findings: Left indirect and direct inguinal hernia, no evidence of prior surgery on the left. Small left cord lipoma. Right side with evidence of prior intervention, no hernia recurrence on the right.   Specimens: none Retained items: no EBL: minimal cc Complications: none  Description of procedure: After obtaining informed consent the patient was taken to the operating room and placed supine on operating room table wheregeneral endotracheal anesthesia was initiated, preoperative antibiotics were administered, SCDs applied, foley inserted and a formal timeout was performed. The abdomen was prepped and draped in usual sterile fashion. Peritoneal access was gained with an infraumbilical Veress needle and insufflation to 15 mmHg ensued without issue. A 5 mm trocar and camera were introduced and gross inspection revealed no evidence of injury or abnormality.The patient was placed in steep Trendelenburg and the pelvis inspected. On the right side there was a very faint scar externally and evidence of scar tissue internally without any hernia present. On the left side there was a combined indirect and direct hernia with a fairly large indirect defect area there was no evidence of prior surgery here. After infiltration with Exparel, a right hemiabdominal 12 mm trocar and a left 5 mm trocar were inserted under direct visualization. The left-sided taps block was performed under laparoscopic visualization using Exparel. Using cautery and blunt dissection, a peritoneal flap was developed spanning from the anterior superior iliac spine to the medial umbilical ligament and  carried down well below the iliopubic tract, exposing the Cooper's ligament medially. The indirect and direct hernia sacs were both reduced intact. There was a small cord lipoma and this was excised and discarded The vas deferens and gonadal vessels were visualized and preserved. Hemostasis was ensured within the field. A large Bard 3-D max light mesh was introduced through the 12 mm trocar and situated within the field to cover both the direct and indirect defects with plenty of overlap. The mesh was tacked to the Cooper's ligament medially and then superiorly on either side of the inferior epigastric vessels using the secure strap Vicryl tacker. The mesh was noted to sit nicely and flat within the field. The peritoneal flap was then brought back up to cover the mesh and re-opposed to the anterior abdominal wall using the secure strap tacker. There was no exposed mesh upon completion and the field remained hemostatic. The 12 mm trocar site in the right abdomen was closed with 0 Vicryl in the fascia using a laparoscopic suture passer under direct visualization.The abdomen was then desufflated and all trochars removed. The skin incisions were closed with subcuticular Monocryl and Dermabond. Foley catheter was removed. The patient was then awakened, extubated and taken to PACU in stable condition.   All counts were correct at the completion of the case.

## 2016-09-27 NOTE — Transfer of Care (Cosign Needed)
Immediate Anesthesia Transfer of Care Note  Patient: James Juarez  Procedure(s) Performed: Procedure(s): LAPAROSCOPIC LEFT INGUINAL HERNIA REPAIR (Left) INSERTION OF MESH (Left)  Patient Location: PACU  Anesthesia Type:General  Level of Consciousness: awake  Airway & Oxygen Therapy: Patient connected to face mask oxygen  Post-op Assessment: Post -op Vital signs reviewed and stable  Post vital signs: Reviewed  Last Vitals:  Vitals:   09/27/16 0810 09/27/16 0827  BP: (!) 157/80 (!) 145/85  Pulse: 71   Resp: 16   Temp: 37.2 C   SpO2: 98%     Last Pain:  Vitals:   09/27/16 0810  TempSrc: Oral      Patients Stated Pain Goal: 3 (13/08/65 7846)  Complications: No apparent anesthesia complications

## 2016-09-27 NOTE — Discharge Instructions (Signed)
HERNIA REPAIR: POST OP INSTRUCTIONS  ######################################################################  EAT Gradually transition to a high fiber diet with a fiber supplement over the next few weeks after discharge.  Start with a pureed / full liquid diet (see below)  WALK Walk an hour a day.  Control your pain to do that.    CONTROL PAIN Control pain so that you can walk, sleep, tolerate sneezing/coughing, go up/down stairs.  HAVE A BOWEL MOVEMENT DAILY Keep your bowels regular to avoid problems.  OK to try a laxative to override constipation.  OK to use an antidairrheal to slow down diarrhea.  Call if not better after 2 tries  CALL IF YOU HAVE PROBLEMS/CONCERNS Call if you are still struggling despite following these instructions. Call if you have concerns not answered by these instructions  ######################################################################    1. DIET: Follow a light bland diet the first 24 hours after arrival home, such as soup, liquids, crackers, etc.  Be sure to include lots of fluids daily.  Avoid fast food or heavy meals as your are more likely to get nauseated.  Eat a low fat the next few days after surgery. 2. Take your usually prescribed home medications unless otherwise directed. 3. PAIN CONTROL: a. Pain is best controlled by a usual combination of three different methods TOGETHER: i. Ice/Heat ii. Over the counter pain medication iii. Prescription pain medication b. Most patients will experience some swelling and bruising around the hernia(s) such as the bellybutton, groins, or old incisions.  Ice packs or heating pads (30-60 minutes up to 6 times a day) will help. Use ice for the first few days to help decrease swelling and bruising, then switch to heat to help relax tight/sore spots and speed recovery.  Some people prefer to use ice alone, heat alone, alternating between ice & heat.  Experiment to what works for you.  Swelling and bruising can take  several weeks to resolve.   c. It is helpful to take an over-the-counter pain medication regularly for the first few weeks.  Choose one of the following that works best for you: i. Naproxen (Aleve, etc)  Two 220mg  tabs twice a day ii. Ibuprofen (Advil, etc) Three 200mg  tabs four times a day (every meal & bedtime) iii. Acetaminophen (Tylenol, etc) 325-650mg  four times a day (every meal & bedtime) d. A  prescription for pain medication should be given to you upon discharge.  Take your pain medication as prescribed.  i. If you are having problems/concerns with the prescription medicine (does not control pain, nausea, vomiting, rash, itching, etc), please call us [phone] to see if we need to switch you to a different pain medicine that will work better for you and/or control your side effect better. ii. If you need a refill on your pain medication, please contact your pharmacy.  They will contact our office to request authorization. Prescriptions will not be filled after 5 pm or on week-ends. 4. Avoid getting constipated.  Between the surgery and the pain medications, it is common to experience some constipation.  Increasing fluid intake and taking a fiber supplement (such as Metamucil, Citrucel, FiberCon, MiraLax, etc) 1-2 times a day regularly will usually help prevent this problem from occurring.  A mild laxative (prune juice, Milk of Magnesia, MiraLax, etc) should be taken according to package directions if there are no bowel movements after 48 hours.   5. Wash / shower every day.  You may shower over the skin glue which waterproof.  No rubbing, scrubbing, lotions  or ointments to incisions. 6. Skin glue will flake off after about 2 weeks.  You may leave the incisions open to air.  You may replace a dressing/Band-Aid to cover the incision for comfort if you wish.  Continue to shower over incision(s) after the dressing is off.    7. ACTIVITIES as tolerated:   a. You may resume regular (light)  daily activities beginning the next day--such as daily self-care, walking, climbing stairs--gradually increasing activities as tolerated.  If you can walk 30 minutes without difficulty, it is safe to try more intense activity such as jogging, treadmill, bicycling, low-impact aerobics, swimming, etc. b. Save the most intensive and strenuous activity for last such as sit-ups, heavy lifting, contact sports, etc  Refrain from any heavy lifting or straining until you are off narcotics for pain control and about 6 weeks after surgery.   c. DO NOT PUSH THROUGH PAIN.  Let pain be your guide: If it hurts to do something, don't do it.  Pain is your body warning you to avoid that activity for another week until the pain goes down. d. You may drive when you are no longer taking prescription pain medication, you can comfortably wear a seatbelt, and you can safely maneuver your car and apply brakes. e. Dennis Bast may have sexual intercourse when it is comfortable.  8. FOLLOW UP in our office a. Please call [facility] at [phone] to set up an appointment to see your surgeon in the office for a follow-up appointment approximately 2-3 weeks after your surgery. b. Make sure that you call for this appointment the day you arrive home to insure a convenient appointment time. 9.  IF YOU HAVE DISABILITY OR FAMILY LEAVE FORMS, BRING THEM TO THE OFFICE FOR PROCESSING.  DO NOT GIVE THEM TO YOUR DOCTOR.  WHEN TO CALL us [phone]: 1. Poor pain control 2. Reactions / problems with new medications (rash/itching, nausea, etc)  3. Fever over 101.5 F (38.5 C) 4. Inability to urinate 5. Nausea and/or vomiting 6. Worsening swelling or bruising 7. Continued bleeding from incision. 8. Increased pain, redness, or drainage from the incision   The clinic staff is available to answer your questions during regular business hours (8:30am-5pm).  Please dont hesitate to call and ask to speak to one of our nurses for clinical concerns.   If  you have a medical emergency, go to the nearest emergency room or call 911.  A surgeon from [facility] is always on call at the hospitals in [facility], [place], [place], [place], [place]  [address] ?  [address] [address], [place], [place]   [address] MAIN: [phone] ? TOLL FREE: [phone] ? FAX: [phone] [email or web address]   General Anesthesia, Adult, Care After These instructions provide you with information about caring for yourself after your procedure. Your health care provider may also give you more specific instructions. Your treatment has been planned according to current medical practices, but problems sometimes occur. Call your health care provider if you have any problems or questions after your procedure. What can I expect after the procedure? After the procedure, it is common to have:  Vomiting.  A sore throat.  Mental slowness.  It is common to feel:  Nauseous.  Cold or shivery.  Sleepy.  Tired.  Sore or achy, even in parts of your body where you did not have surgery.  Follow these instructions at home: For at least 24 hours after the procedure:  Do not: ? Participate in activities where you could fall or become injured. ? Drive. ? Use heavy machinery. ? Drink alcohol. ? Take sleeping pills or medicines that cause drowsiness. ? Make important decisions or sign legal documents. ? Take care of children on your own.  Rest. Eating and drinking  If you vomit, drink water, juice, or soup when you can drink without vomiting.  Drink enough fluid to keep your urine clear or pale yellow.  Make sure you have little or no nausea before eating solid foods.  Follow the diet recommended by your health care provider. General instructions  Have a responsible adult stay with you until you are awake and alert.  Return to your normal activities as told by your health care provider. Ask your  health care provider what activities are safe for you.  Take over-the-counter and prescription medicines only as told by your health care provider.  If you smoke, do not smoke without supervision.  Keep all follow-up visits as told by your health care provider. This is important. Contact a health care provider if:  You continue to have nausea or vomiting at home, and medicines are not helpful.  You cannot drink fluids or start eating again.  You cannot urinate after 8-12 hours.  You develop a skin rash.  You have fever.  You have increasing redness at the site of your procedure. Get help right away if:  You have difficulty breathing.  You have chest pain.  You have unexpected bleeding.  You feel that you are having a life-threatening or urgent problem. This information is not intended to replace advice given to you by your health care provider. Make sure you discuss any questions you have with your health care provider. Document Released: 04/03/2000 Document Revised: 05/31/2015 Document Reviewed: 12/10/2014 Elsevier Interactive Patient Education  [employer].

## 2016-09-27 NOTE — Interval H&P Note (Signed)
History and Physical Interval Note:  09/27/2016 9:39 AM  James Juarez  has presented today for surgery, with the diagnosis of RECURRENT LEFT INGUINAL HERNIA   The various methods of treatment have been discussed with the patient and family. After consideration of risks, benefits and other options for treatment, the patient has consented to  Procedure(s): LAPAROSCOPIC LEFT POSSIBLE BILATERAL INGUINAL HERNIA REPAIR (Left) INSERTION OF MESH (Left) as a surgical intervention .  The patient's history has been reviewed, patient examined, no change in status, stable for surgery.  I have reviewed the patient's chart and labs.  Questions were answered to the patient's satisfaction.     [name]

## 2016-09-27 NOTE — Progress Notes (Signed)
Patient has met all the criteria to go home. Waiting on son to pick him up.

## 2016-09-27 NOTE — Anesthesia Postprocedure Evaluation (Signed)
Anesthesia Post Note  Patient: James Juarez  Procedure(s) Performed: Procedure(s) (LRB): LAPAROSCOPIC LEFT INGUINAL HERNIA REPAIR (Left) INSERTION OF MESH (Left)     Patient location during evaluation: PACU Anesthesia Type: General Level of consciousness: awake and alert Pain management: pain level controlled Vital Signs Assessment: post-procedure vital signs reviewed and stable Respiratory status: spontaneous breathing, nonlabored ventilation and respiratory function stable Cardiovascular status: blood pressure returned to baseline and stable Postop Assessment: no apparent nausea or vomiting Anesthetic complications: no    Last Vitals:  Vitals:   09/27/16 1205 09/27/16 1210  BP:  127/75  Pulse: 82 76  Resp: 17 16  Temp:  (!) 36.4 C  SpO2: 93% 96%    Last Pain:  Vitals:   09/27/16 1210  TempSrc:   PainSc: 4                  Lennyn Bellanca A.

## 2016-09-27 NOTE — Anesthesia Procedure Notes (Signed)
Procedure Name: Intubation Date/Time: 09/27/2016 10:03 AM Performed by: Leonia Heatherly, Virgel Gess Pre-anesthesia Checklist: Patient identified, Emergency Drugs available, Suction available and Patient being monitored Patient Re-evaluated:Patient Re-evaluated prior to induction Oxygen Delivery Method: Circle System Utilized Preoxygenation: Pre-oxygenation with 100% oxygen Induction Type: IV induction Ventilation: Mask ventilation without difficulty Laryngoscope Size: Miller and 2 Grade View: Grade I Tube type: Oral Tube size: 7.0 mm Number of attempts: 1 Airway Equipment and Method: Stylet and Oral airway Placement Confirmation: ETT inserted through vocal cords under direct vision,  positive ETCO2 and breath sounds checked- equal and bilateral Secured at: 22 cm Tube secured with: Tape Dental Injury: Teeth and Oropharynx as per pre-operative assessment

## 2016-09-27 NOTE — Anesthesia Preprocedure Evaluation (Addendum)
Anesthesia Evaluation  Patient identified by MRN, date of birth, ID band Patient awake    Reviewed: Allergy & Precautions, NPO status , Patient's Chart, lab work & pertinent test results  Airway Mallampati: II       Dental  (+) Partial Upper, Partial Lower, Caps   Pulmonary asthma , former smoker,    Pulmonary exam normal breath sounds clear to auscultation       Cardiovascular hypertension, Pt. on medications Normal cardiovascular exam Rhythm:Regular Rate:Normal     Neuro/Psych Hx/o Bilateral subdural hematomas 2013 negative psych ROS   GI/Hepatic negative GI ROS, Neg liver ROS,   Endo/Other  diabetes, Well Controlled, Type 2, Oral Hypoglycemic AgentsHypothyroidism Hyperlipidemia  Renal/GU Renal InsufficiencyRenal disease  negative genitourinary   Musculoskeletal negative musculoskeletal ROS (+)   Abdominal   Peds  Hematology  (+) anemia ,   Anesthesia Other Findings   Reproductive/Obstetrics                            Anesthesia Physical Anesthesia Plan  ASA: II  Anesthesia Plan: General   Post-op Pain Management:    Induction: Intravenous  PONV Risk Score and Plan: 4 or greater and Ondansetron, Dexamethasone, Midazolam, Propofol infusion and Promethazine  Airway Management Planned: Oral ETT  Additional Equipment:   Intra-op Plan:   Post-operative Plan: Extubation in OR  Informed Consent: I have reviewed the patients History and Physical, chart, labs and discussed the procedure including the risks, benefits and alternatives for the proposed anesthesia with the patient or authorized representative who has indicated his/her understanding and acceptance.   Dental advisory given  Plan Discussed with: CRNA, Anesthesiologist and Surgeon  Anesthesia Plan Comments:         Anesthesia Quick Evaluation

## 2016-10-11 ENCOUNTER — Ambulatory Visit (INDEPENDENT_AMBULATORY_CARE_PROVIDER_SITE_OTHER): Payer: Medicare Other | Admitting: Podiatry

## 2016-10-11 ENCOUNTER — Encounter: Payer: Self-pay | Admitting: Podiatry

## 2016-10-11 DIAGNOSIS — M722 Plantar fascial fibromatosis: Secondary | ICD-10-CM | POA: Diagnosis not present

## 2016-10-11 MED ORDER — TRIAMCINOLONE ACETONIDE 10 MG/ML IJ SUSP
10.0000 mg | Freq: Once | INTRAMUSCULAR | Status: AC
  Administered 2016-10-11: 10 mg

## 2016-10-11 NOTE — Progress Notes (Signed)
Subjective:    Patient ID: James Juarez, male   DOB: 76 y.o.   MRN: [id number]   HPI patient states that the heels been bothering him quite a bit and that he should've been here earlier but he was not able to get it. Going to [place] for a wetting    ROS      Objective:  Physical Exam neurovascular status intact with acute inflammation plantar aspect left heel in the mid band area and mild posterior tibial tendinitis     Assessment:    Planter fasciitis left with compensatory tendinitis     Plan:   Careful injection administered 3 mg Kenalog 5 mill grams Xylocaine I advised on air fracture walker but he refused and he will continue to use fascial brace and be seen back in 4 weeks and will probably require some form of orthotic device

## 2016-10-19 ENCOUNTER — Other Ambulatory Visit: Payer: Self-pay

## 2016-10-19 MED ORDER — LEVOTHYROXINE SODIUM 75 MCG PO TABS: ORAL_TABLET | ORAL | 1 refills | Status: DC

## 2016-10-27 ENCOUNTER — Other Ambulatory Visit: Payer: Medicare Other

## 2016-12-17 ENCOUNTER — Other Ambulatory Visit: Payer: Self-pay | Admitting: Endocrinology

## 2016-12-21 ENCOUNTER — Other Ambulatory Visit: Payer: Self-pay | Admitting: Endocrinology

## 2016-12-21 ENCOUNTER — Other Ambulatory Visit (INDEPENDENT_AMBULATORY_CARE_PROVIDER_SITE_OTHER): Payer: Medicare Other

## 2016-12-21 DIAGNOSIS — D508 Other iron deficiency anemias: Secondary | ICD-10-CM | POA: Diagnosis not present

## 2016-12-21 DIAGNOSIS — E063 Autoimmune thyroiditis: Secondary | ICD-10-CM | POA: Diagnosis not present

## 2016-12-21 DIAGNOSIS — E119 Type 2 diabetes mellitus without complications: Secondary | ICD-10-CM

## 2016-12-21 LAB — LIPID PANEL
Cholesterol: 155 mg/dL (ref 0–200)
HDL: 61.9 mg/dL (ref >39.00)
LDL Cholesterol: 78 mg/dL (ref 0–99)
NonHDL: 93.43
Total CHOL/HDL Ratio: 3
Triglycerides: 75 mg/dL (ref 0.0–149.0)
VLDL: 15 mg/dL (ref 0.0–40.0)

## 2016-12-21 LAB — COMPREHENSIVE METABOLIC PANEL
ALT: 19 U/L (ref 0–53)
AST: 26 U/L (ref 0–37)
Albumin: 4.4 g/dL (ref 3.5–5.2)
Alkaline Phosphatase: 44 U/L (ref 39–117)
BUN: 28 mg/dL — ABNORMAL HIGH (ref 6–23)
CO2: 28 mEq/L (ref 19–32)
Calcium: 9.6 mg/dL (ref 8.4–10.5)
Chloride: 103 mEq/L (ref 96–112)
Creatinine, Ser: 1.6 mg/dL — ABNORMAL HIGH (ref 0.40–1.50)
GFR: 44.59 mL/min — ABNORMAL LOW (ref >60.00)
Glucose, Bld: 136 mg/dL — ABNORMAL HIGH (ref 70–99)
Potassium: 4.9 mEq/L (ref 3.5–5.1)
Sodium: 138 mEq/L (ref 135–145)
Total Bilirubin: 0.5 mg/dL (ref 0.2–1.2)
Total Protein: 7.3 g/dL (ref 6.0–8.3)

## 2016-12-21 LAB — CBC
HCT: 38.4 % — ABNORMAL LOW (ref 39.0–52.0)
Hemoglobin: 12.4 g/dL — ABNORMAL LOW (ref 13.0–17.0)
MCHC: 32.4 g/dL (ref 30.0–36.0)
MCV: 93.4 fl (ref 78.0–100.0)
Platelets: 231 10*3/uL (ref 150.0–400.0)
RBC: 4.11 Mil/uL — ABNORMAL LOW (ref 4.22–5.81)
RDW: 13.8 % (ref 11.5–15.5)
WBC: 5.7 10*3/uL (ref 4.0–10.5)

## 2016-12-21 LAB — FERRITIN: Ferritin: 12.3 ng/mL — ABNORMAL LOW (ref 22.0–322.0)

## 2016-12-21 LAB — MICROALBUMIN / CREATININE URINE RATIO
Creatinine,U: 91.5 mg/dL
Microalb Creat Ratio: 0.8 mg/g (ref 0.0–30.0)
Microalb, Ur: <0.7 mg/dL (ref 0.0–1.9)

## 2016-12-21 LAB — TSH: TSH: 2.43 u[IU]/mL (ref 0.35–4.50)

## 2016-12-21 LAB — T4, FREE: Free T4: 1.08 ng/dL (ref 0.60–1.60)

## 2016-12-21 LAB — HEMOGLOBIN A1C: Hgb A1c MFr Bld: 6.6 % — ABNORMAL HIGH (ref 4.6–6.5)

## 2016-12-25 ENCOUNTER — Ambulatory Visit: Payer: Medicare Other | Admitting: Endocrinology

## 2016-12-25 ENCOUNTER — Encounter: Payer: Self-pay | Admitting: Endocrinology

## 2016-12-25 VITALS — BP 118/78 | HR 75 | Ht 69.0 in | Wt 183.8 lb

## 2016-12-25 DIAGNOSIS — D508 Other iron deficiency anemias: Secondary | ICD-10-CM

## 2016-12-25 DIAGNOSIS — N289 Disorder of kidney and ureter, unspecified: Secondary | ICD-10-CM | POA: Diagnosis not present

## 2016-12-25 DIAGNOSIS — E119 Type 2 diabetes mellitus without complications: Secondary | ICD-10-CM

## 2016-12-25 DIAGNOSIS — E7800 Pure hypercholesterolemia, unspecified: Secondary | ICD-10-CM | POA: Diagnosis not present

## 2016-12-25 DIAGNOSIS — I10 Essential (primary) hypertension: Secondary | ICD-10-CM

## 2016-12-25 MED ORDER — AMLODIPINE BESYLATE 5 MG PO TABS: 5.0000 mg | ORAL_TABLET | Freq: Every day | ORAL | 1 refills | Status: DC

## 2016-12-25 NOTE — Patient Instructions (Addendum)
Stop Losartan  Janumet only in pm, back to 2x daily when kidney ok  Check blood sugars on waking up   3/7  Also check blood sugars about 2 hours after a meal and do this after different meals by rotation  Recommended blood sugar levels on waking up is 90-130 and about 2 hours after meal is 130-160  Please bring your blood sugar monitor to each visit, thank you  Vitamin C 100mg  with iron  No Advil, alleve

## 2016-12-25 NOTE — Progress Notes (Signed)
Patient ID: James Juarez, male   DOB: 1940/04/24, 76 y.o.   MRN: [id number]   Reason for Appointment: follow-up   History of Present Illness   Diagnosis: Type 2 DIABETES MELITUS  Oral hypoglycemic drugs: Glipizide ER 5 mg, Actos and Janumet 50/1000 twice a day   He has had long-standing diabetes which has been well-controlled with the same 4 drug regimen which has been unchanged for several years  He is usually is fairly compliant with his diet  Usually trying to watch portions of carbohydrates and avoiding meat, has an egg.  Protein in the morning Weight is only slightly higher Recently has been somewhat irregular with walking, doing this mostly in dose  A1c is usually upper normal and is stable at 6.6 He has checked blood sugars at home mostly FASTING glucose range of 86-1 20 and AVERAGE 103 No readings after meals  His glucose was 136 in the lab  Side effects from medications: None        Monitors blood glucose:  infrequently    Glucometer:  Accu-Chek [employer] His insurance does not cover the Accu-Chek guide       Diet: Generally low fat and small portions        Physical activity: exercise: Walking 20 min, 2/7 days a week              Wt Readings from Last 3 Encounters:  12/25/16 183 lb 12.8 oz (83.4 kg)  09/27/16 179 lb (81.2 kg)  09/13/16 179 lb (81.2 kg)     He has several other active problems which are discussed in review of systems   LABS:  Lab Results  Component Value Date   HGBA1C 6.6 (H) 12/21/2016   HGBA1C 5.9 (H) 09/13/2016   HGBA1C 6.4 05/29/2016   Lab Results  Component Value Date   MICROALBUR <0.7 12/21/2016   LDLCALC 78 12/21/2016   CREATININE 1.60 (H) 12/21/2016     Lab on 12/21/2016  Component Date Value Ref Range Status  . TSH 12/21/2016 2.43  0.35 - 4.50 uIU/mL Final  . Free T4 12/21/2016 1.08  0.60 - 1.60 ng/dL Final   Comment: Specimens from patients who are undergoing biotin therapy and /or ingesting biotin supplements may  contain high levels of biotin.  The higher biotin concentration in these specimens interferes with this Free T4 assay.  Specimens that contain high levels  of biotin may cause false high results for this Free T4 assay.  Please interpret results in light of the total clinical presentation of the patient.    . WBC 12/21/2016 5.7  4.0 - 10.5 K/uL Final  . RBC 12/21/2016 4.11* 4.22 - 5.81 Mil/uL Final  . Platelets 12/21/2016 231.0  150.0 - 400.0 K/uL Final  . Hemoglobin 12/21/2016 12.4* 13.0 - 17.0 g/dL Final  . HCT 12/21/2016 38.4* 39.0 - 52.0 % Final  . MCV 12/21/2016 93.4  78.0 - 100.0 fl Final  . MCHC 12/21/2016 32.4  30.0 - 36.0 g/dL Final  . RDW 12/21/2016 13.8  11.5 - 15.5 % Final  . Microalb, Ur 12/21/2016 <0.7  0.0 - 1.9 mg/dL Final  . Creatinine,U 12/21/2016 91.5  mg/dL Final  . Microalb Creat Ratio 12/21/2016 0.8  0.0 - 30.0 mg/g Final  . Cholesterol 12/21/2016 155  0 - 200 mg/dL Final   ATP III Classification       Desirable:  < 200 mg/dL  Borderline High:  200 - 239 mg/dL          High:  > = 240 mg/dL  . Triglycerides 12/21/2016 75.0  0.0 - 149.0 mg/dL Final   Normal:  <150 mg/dLBorderline High:  150 - 199 mg/dL  . HDL 12/21/2016 61.90  >39.00 mg/dL Final  . VLDL 12/21/2016 15.0  0.0 - 40.0 mg/dL Final  . LDL Cholesterol 12/21/2016 78  0 - 99 mg/dL Final  . Total CHOL/HDL Ratio 12/21/2016 3   Final                  Men          Women1/2 Average Risk     3.4          3.3Average Risk          5.0          4.42X Average Risk          9.6          7.13X Average Risk          15.0          11.0                      . NonHDL 12/21/2016 93.43   Final   NOTE:  Non-HDL goal should be 30 mg/dL higher than patient's LDL goal (i.e. LDL goal of < 70 mg/dL, would have non-HDL goal of < 100 mg/dL)  . Sodium 12/21/2016 138  135 - 145 mEq/L Final  . Potassium 12/21/2016 4.9  3.5 - 5.1 mEq/L Final  . Chloride 12/21/2016 103  96 - 112 mEq/L Final  . CO2 12/21/2016 28  19 - 32 mEq/L  Final  . Glucose, Bld 12/21/2016 136* 70 - 99 mg/dL Final  . BUN 12/21/2016 28* 6 - 23 mg/dL Final  . Creatinine, Ser 12/21/2016 1.60* 0.40 - 1.50 mg/dL Final  . Total Bilirubin 12/21/2016 0.5  0.2 - 1.2 mg/dL Final  . Alkaline Phosphatase 12/21/2016 44  39 - 117 U/L Final  . AST 12/21/2016 26  0 - 37 U/L Final  . ALT 12/21/2016 19  0 - 53 U/L Final  . Total Protein 12/21/2016 7.3  6.0 - 8.3 g/dL Final  . Albumin 12/21/2016 4.4  3.5 - 5.2 g/dL Final  . Calcium 12/21/2016 9.6  8.4 - 10.5 mg/dL Final  . GFR 12/21/2016 44.59* >60.00 mL/min Final  . Hgb A1c MFr Bld 12/21/2016 6.6* 4.6 - 6.5 % Final   Glycemic Control Guidelines for People with Diabetes:Non Diabetic:  <6%Goal of Therapy: <7%Additional Action Suggested:  >8%   . Ferritin 12/21/2016 12.3* 22.0 - 322.0 ng/mL Final    Allergies as of 12/25/2016   No Known Allergies     Medication List        Accurate as of 12/25/16  9:39 PM. Always use your most recent med list.          ACCU-CHEK AVIVA PLUS w/Device Kit Use to check blood sugars 2 times daily, Dx Code E11.65   ACCU-CHEK FASTCLIX LANCETS Misc Use to check blood sugar 2 times per day dx code E11.65   acetaminophen 325 MG tablet Commonly known as:  TYLENOL Take 325 mg by mouth every 6 (six) hours as needed. For pain   albuterol 108 (90 Base) MCG/ACT inhaler Commonly known as:  PROVENTIL HFA;VENTOLIN HFA Inhale 2 puffs into the lungs every 6 (six) hours as needed. For shortness of breath  amLODipine 5 MG tablet Commonly known as:  NORVASC Take 1 tablet (5 mg total) by mouth daily.   clobetasol 0.05 % Gel Commonly known as:  TEMOVATE Apply 1 application topically daily as needed. For face.   ferrous sulfate 325 (65 FE) MG EC tablet Take 325 mg by mouth daily with breakfast.   fexofenadine 180 MG tablet Commonly known as:  ALLEGRA Take 180 mg by mouth daily as needed. For allergies   glipiZIDE 5 MG 24 hr tablet Commonly known as:  GLUCOTROL XL TAKE 1  TABLET BY MOUTH EVERY DAY   glucose blood test strip Commonly known as:  ACCU-CHEK AVIVA PLUS Use as instructed   HYDROcodone-acetaminophen 5-325 MG tablet Commonly known as:  NORCO/VICODIN Take 1 tablet by mouth every 6 (six) hours as needed for moderate pain.   hydroxypropyl methylcellulose / hypromellose 2.5 % ophthalmic solution Commonly known as:  ISOPTO TEARS / GONIOVISC Place 1 drop into both eyes at bedtime.   JANUMET 50-1000 MG tablet Generic drug:  sitaGLIPtin-metformin TAKE 1 TABLET BY MOUTH TWICE A DAY WITH A MEAL   levothyroxine 75 MCG tablet Commonly known as:  SYNTHROID, LEVOTHROID TAKE 1 TABLET BY MOUTH EVERY DAY BEFORE BREAKFAST   losartan 50 MG tablet Commonly known as:  COZAAR TAKE 1 TABLET BY MOUTH EVERY DAY   multivitamin capsule Take 1 capsule by mouth daily.   pioglitazone 30 MG tablet Commonly known as:  ACTOS TAKE 1 TABLET BY MOUTH EVERY DAY   rosuvastatin 5 MG tablet Commonly known as:  CRESTOR TAKE 1 TABLET BY MOUTH EVERY DAY       Allergies: No Known Allergies  Past Medical History:  Diagnosis Date  . Asthma   . Diabetes mellitus   . Diverticular disease   . Hypertension   . Subdural hematoma [facility]) May 2013   bilateral     Past Surgical History:  Procedure Laterality Date  . BURR HOLE  05/22/2011   Procedure: Haskell Flirt;  Surgeon: Elaina Hoops, MD;  Location: [place] [facility];  Service: Neurosurgery;  Laterality: Right;  Right Burr Holes for evacuation of subdural hematoma  . BURR HOLE  05/23/2011   Procedure: Haskell Flirt;  Surgeon: Elaina Hoops, MD;  Location: [place] [facility];  Service: Neurosurgery;  Laterality: Left;  Left [facility]  . CRANIOTOMY  06/14/2011   Procedure: CRANIOTOMY HEMATOMA EVACUATION SUBDURAL;  Surgeon: Elaina Hoops, MD;  Location: [place] [facility];  Service: Neurosurgery;  Laterality: N/A;  Left Craniotomy for subdural hematoma  . CRANIOTOMY  06/23/2011   Procedure: CRANIOTOMY HEMATOMA EVACUATION SUBDURAL;  Surgeon: Elaina Hoops, MD;  Location: [place] [facility];  Service: Neurosurgery;  Laterality: Left;  Redo Craniotomy for Subdural Hematoma  . HERNIA REPAIR    . INGUINAL HERNIA REPAIR Left 09/27/2016   Procedure: LAPAROSCOPIC LEFT INGUINAL HERNIA REPAIR;  Surgeon: Clovis Riley, MD;  Location: [facility];  Service: General;  Laterality: Left;  . INSERTION OF MESH Left 09/27/2016   Procedure: INSERTION OF MESH;  Surgeon: Clovis Riley, MD;  Location: [facility];  Service: General;  Laterality: Left;    Family History  Problem Relation Age of Onset  . Diabetes Mother   . Diabetes Father   . Heart attack Father   . Diabetes Sister   . Diabetes Brother   . Heart attack Brother     Social History:  reports that he quit smoking about 41 years ago. he has never used smokeless tobacco. He reports  that he does not drink alcohol or use drugs.  Review of Systems:  HYPERTENSION:  he has had hypertension for several years   Currently on 50 mg losartan with good control for some time Recent blood pressure at home: 115/75   RENAL dysfunction: His creatinine is appearing progressively higher now, has had transient increases before Does not take any nonsteroidal anti-inflammatory drugs Does not complain of hesitancy or weak stream, does have chronic mild nocturia   Lab Results  Component Value Date   CREATININE 1.60 (H) 12/21/2016   CREATININE 1.46 (H) 09/13/2016   CREATININE 1.24 05/29/2016    HYPERLIPIDEMIA: The lipid abnormality consists of elevated LDL, well controlled with 5 mg Crestor LDL is slightly higher but overall the same   Lab Results  Component Value Date   CHOL 155 12/21/2016   HDL 61.90 12/21/2016   LDLCALC 78 12/21/2016   TRIG 75.0 12/21/2016   CHOLHDL 3 12/21/2016    HYPOTHYROIDISM: he has had long-standing mild hypothyroidism since 1995 and since then has been on 75 mcg, 6/7 days a week Does not complain of fatigue    No previous history of goiter Again TSH is quite normal  Thyroid  levels as follows:  Lab Results  Component Value Date   TSH 2.43 12/21/2016   TSH 0.48 05/29/2016   TSH 1.01 10/27/2015   FREET4 1.08 12/21/2016   FREET4 1.20 10/27/2015   FREET4 1.31 03/30/2015    No symptoms of numbness in his feet. Diabetic foot exam done in 12/18  He has had anemia which is multifactorial and has been given iron by PCP, taking 65 mg at breakfast consistently.  Ferritin level still low  and he had asked for this to be evaluated today  He is taking his B12 supplement for pernicious anemia   Lab Results  Component Value Date   WBC 5.7 12/21/2016   HGB 12.4 (L) 12/21/2016   HCT 38.4 (L) 12/21/2016   MCV 93.4 12/21/2016   PLT 231.0 12/21/2016    PRIMARY HYPOGONADISM:  He does not complain of unusual fatigue Previously had low testosterone level with minimal symptoms and his diagnosis was made in 2/14 with a free T level of 4.8, increased LH of 33 Testosterone was normal    Lab Results  Component Value Date   TESTOSTERONE 311.21 03/30/2015      Examination:   BP 118/78   Pulse 75   Ht 5' 9"  (1.753 m)   Wt 183 lb 12.8 oz (83.4 kg)   SpO2 98%   BMI 27.14 kg/m   Body mass index is 27.14 kg/m.    Diabetic Foot Exam - Simple   Simple Foot Form Diabetic Foot exam was performed with the following findings:  Yes   Visual Inspection No deformities, no ulcerations, no other skin breakdown bilaterally:  Yes Sensation Testing Intact to touch and monofilament testing bilaterally:  Yes Pulse Check Posterior Tibialis and Dorsalis pulse intact bilaterally:  Yes Comments      ASSESSMENT/ PLAN:   Diabetes type 2   The patient's diabetes appears to be overall  well controlled with consistently upper normal A1c He is on multiple drugs and this has been stable regimen Although A1c is slightly higher at 6.6 his fasting readings are near 100 on an average Discussed need to check blood sugars after meals especially evening meal He needs to try to walk  more regularly and for longer periods of time JANUMET will be reduced to once a day for  now because of abnormal renal function If his creatinine clearance is about 50 or more He can ask his PCP about increasing this to twice a day again  HYPERTENSION: Blood pressure is  normal on losartan  Renal dysfunction: This is relatively worse and higher than usual Not clear if he has any chemical renal disease, no history of significant prostatism Most likely has some nephrosclerosis and will do better with amlodipine compared to losartan and will try this for the next month Meanwhile advised him to avoid all nonsteroidal anti-inflammatory drugs  Reminded him to follow-up with PCP in 1 month to repeat renal function, blood pressure and consider nephrology consultation if needed  Hypothyroidism: TSH is normal, he will continue to take Synthroid 75 g, 6 days a week    LIPIDS: excellent control of all parameters  Anemia: Recommended that he have stool Hemoccult which is due Continues to have low iron levels and he can try adding vitamin C to improve absorption   Total visit time for evaluation and management of multiple problems, counseling, review of labs and communication with PCP = 25 minutes  Patient Instructions  Stop Losartan  Janumet only in pm, back to 2x daily when kidney ok  Check blood sugars on waking up   3/7  Also check blood sugars about 2 hours after a meal and do this after different meals by rotation  Recommended blood sugar levels on waking up is 90-130 and about 2 hours after meal is 130-160  Please bring your blood sugar monitor to each visit, thank you  Vitamin C 141m with iron  No Advil, alleve      AElayne Snare12/17/2018, 9:39 PM

## 2016-12-26 ENCOUNTER — Other Ambulatory Visit: Payer: Self-pay | Admitting: Endocrinology

## 2016-12-29 ENCOUNTER — Other Ambulatory Visit: Payer: Self-pay | Admitting: Endocrinology

## 2017-01-12 ENCOUNTER — Ambulatory Visit: Payer: Medicare Other | Admitting: Podiatry

## 2017-02-02 ENCOUNTER — Encounter: Payer: Self-pay | Admitting: Podiatry

## 2017-02-02 ENCOUNTER — Ambulatory Visit: Payer: Medicare Other | Admitting: Podiatry

## 2017-02-02 ENCOUNTER — Ambulatory Visit (INDEPENDENT_AMBULATORY_CARE_PROVIDER_SITE_OTHER): Payer: Medicare Other

## 2017-02-02 DIAGNOSIS — M722 Plantar fascial fibromatosis: Secondary | ICD-10-CM

## 2017-02-02 DIAGNOSIS — M779 Enthesopathy, unspecified: Secondary | ICD-10-CM

## 2017-02-02 MED ORDER — TRIAMCINOLONE ACETONIDE 10 MG/ML IJ SUSP
10.0000 mg | Freq: Once | INTRAMUSCULAR | Status: AC
  Administered 2017-02-02: 10 mg

## 2017-02-02 NOTE — Patient Instructions (Signed)

## 2017-02-02 NOTE — Progress Notes (Signed)
Subjective:   Patient ID: James Juarez, male   DOB: 77 y.o.   MRN: [id number]   HPI Patient presents stating the bottom of my heel still really bother me but now the pain is worse on the side and I know I am not walking normally.  Patient admits is been hurting since the previous visit 4 months ago   ROS      Objective:  Physical Exam  Neurovascular status intact with tendinitis noted perineal group left with inflammation fluid and continue plantar fasciitis left     Assessment:  Peroneal tendinitis left with moderate plantar fascial symptomatology left     Plan:  Reviewed condition at great length and x-rays in today due to the long-standing nature of this and the pain in several different areas I did place an air fracture walker.  I also injected the lateral tendon group 3 mg Kenalog 5 mg Xylocaine which was tolerated well  X-rays indicate large plantar spur formation with no indication of acute stress fracture or arthritis

## 2017-02-15 ENCOUNTER — Other Ambulatory Visit: Payer: Self-pay | Admitting: Endocrinology

## 2017-02-16 ENCOUNTER — Other Ambulatory Visit: Payer: Self-pay

## 2017-02-16 MED ORDER — AMLODIPINE BESYLATE 5 MG PO TABS: 5.0000 mg | ORAL_TABLET | Freq: Every day | ORAL | 1 refills | Status: DC

## 2017-03-17 ENCOUNTER — Other Ambulatory Visit: Payer: Self-pay | Admitting: Endocrinology

## 2017-03-22 ENCOUNTER — Other Ambulatory Visit: Payer: Medicare Other

## 2017-05-21 ENCOUNTER — Telehealth: Payer: Self-pay | Admitting: Endocrinology

## 2017-05-21 NOTE — Telephone Encounter (Signed)
Patients wife Stated that patients PCP from [facility] will be faxing over patients blood work for our dr to review,.  They would like a phone call to discuss blood work once dr has looked at them.   Please advise

## 2017-05-25 ENCOUNTER — Telehealth: Payer: Self-pay | Admitting: Endocrinology

## 2017-05-25 NOTE — Telephone Encounter (Signed)
Patient is calling asking if you received lab results from PCP [facility] [place] office please advise  Leave a voice message if not there

## 2017-05-25 NOTE — Telephone Encounter (Signed)
Called patient and left message asking patient to call PCP and hav results refaxed.

## 2017-06-04 ENCOUNTER — Other Ambulatory Visit: Payer: Self-pay | Admitting: Endocrinology

## 2017-06-09 ENCOUNTER — Other Ambulatory Visit: Payer: Self-pay | Admitting: Endocrinology

## 2017-06-22 ENCOUNTER — Other Ambulatory Visit: Payer: Medicare Other

## 2017-06-25 ENCOUNTER — Ambulatory Visit: Payer: Medicare Other | Admitting: Endocrinology

## 2017-06-25 ENCOUNTER — Other Ambulatory Visit: Payer: Self-pay | Admitting: Endocrinology

## 2017-07-05 ENCOUNTER — Other Ambulatory Visit: Payer: Self-pay | Admitting: Endocrinology

## 2017-07-12 ENCOUNTER — Other Ambulatory Visit: Payer: Self-pay | Admitting: Endocrinology

## 2017-07-12 DIAGNOSIS — E119 Type 2 diabetes mellitus without complications: Secondary | ICD-10-CM

## 2017-07-12 DIAGNOSIS — D513 Other dietary vitamin B12 deficiency anemia: Secondary | ICD-10-CM

## 2017-07-12 DIAGNOSIS — E7800 Pure hypercholesterolemia, unspecified: Secondary | ICD-10-CM

## 2017-07-12 DIAGNOSIS — E063 Autoimmune thyroiditis: Secondary | ICD-10-CM

## 2017-07-17 ENCOUNTER — Other Ambulatory Visit (INDEPENDENT_AMBULATORY_CARE_PROVIDER_SITE_OTHER): Payer: Medicare Other

## 2017-07-17 DIAGNOSIS — D513 Other dietary vitamin B12 deficiency anemia: Secondary | ICD-10-CM | POA: Diagnosis not present

## 2017-07-17 DIAGNOSIS — E119 Type 2 diabetes mellitus without complications: Secondary | ICD-10-CM | POA: Diagnosis not present

## 2017-07-17 DIAGNOSIS — E7800 Pure hypercholesterolemia, unspecified: Secondary | ICD-10-CM

## 2017-07-17 DIAGNOSIS — E063 Autoimmune thyroiditis: Secondary | ICD-10-CM

## 2017-07-17 DIAGNOSIS — D508 Other iron deficiency anemias: Secondary | ICD-10-CM | POA: Diagnosis not present

## 2017-07-17 LAB — CBC
HCT: 37.8 % — ABNORMAL LOW (ref 39.0–52.0)
Hemoglobin: 12.5 g/dL — ABNORMAL LOW (ref 13.0–17.0)
MCHC: 33.1 g/dL (ref 30.0–36.0)
MCV: 92.9 fl (ref 78.0–100.0)
Platelets: 213 10*3/uL (ref 150.0–400.0)
RBC: 4.07 Mil/uL — ABNORMAL LOW (ref 4.22–5.81)
RDW: 14 % (ref 11.5–15.5)
WBC: 5.6 10*3/uL (ref 4.0–10.5)

## 2017-07-17 LAB — COMPREHENSIVE METABOLIC PANEL
ALT: 17 U/L (ref 0–53)
AST: 25 U/L (ref 0–37)
Albumin: 4.4 g/dL (ref 3.5–5.2)
Alkaline Phosphatase: 47 U/L (ref 39–117)
BUN: 25 mg/dL — ABNORMAL HIGH (ref 6–23)
CO2: 30 mEq/L (ref 19–32)
Calcium: 9.6 mg/dL (ref 8.4–10.5)
Chloride: 102 mEq/L (ref 96–112)
Creatinine, Ser: 1.39 mg/dL (ref 0.40–1.50)
GFR: 52.38 mL/min — ABNORMAL LOW (ref >60.00)
Glucose, Bld: 141 mg/dL — ABNORMAL HIGH (ref 70–99)
Potassium: 4.1 mEq/L (ref 3.5–5.1)
Sodium: 138 mEq/L (ref 135–145)
Total Bilirubin: 0.6 mg/dL (ref 0.2–1.2)
Total Protein: 7.3 g/dL (ref 6.0–8.3)

## 2017-07-17 LAB — LIPID PANEL
Cholesterol: 151 mg/dL (ref 0–200)
HDL: 58.6 mg/dL (ref >39.00)
LDL Cholesterol: 74 mg/dL (ref 0–99)
NonHDL: 92.79
Total CHOL/HDL Ratio: 3
Triglycerides: 95 mg/dL (ref 0.0–149.0)
VLDL: 19 mg/dL (ref 0.0–40.0)

## 2017-07-17 LAB — VITAMIN B12: Vitamin B-12: 300 pg/mL (ref 211–911)

## 2017-07-17 LAB — TSH: TSH: 3.66 u[IU]/mL (ref 0.35–4.50)

## 2017-07-17 LAB — HEMOGLOBIN A1C: Hgb A1c MFr Bld: 6.5 % (ref 4.6–6.5)

## 2017-07-17 LAB — T4, FREE: Free T4: 1 ng/dL (ref 0.60–1.60)

## 2017-07-19 ENCOUNTER — Other Ambulatory Visit: Payer: Self-pay | Admitting: Endocrinology

## 2017-07-19 ENCOUNTER — Telehealth: Payer: Self-pay

## 2017-07-19 DIAGNOSIS — D508 Other iron deficiency anemias: Secondary | ICD-10-CM

## 2017-07-19 LAB — FERRITIN: Ferritin: 15.6 ng/mL — ABNORMAL LOW (ref 22.0–322.0)

## 2017-07-19 NOTE — Telephone Encounter (Signed)
Will fax request to add lab.

## 2017-07-19 NOTE — Telephone Encounter (Signed)
Patient called today to request his ferriton level be checked if this is possible from the blood work he just had done on 07/17/17

## 2017-07-19 NOTE — Telephone Encounter (Signed)
Please add on

## 2017-07-20 ENCOUNTER — Encounter: Payer: Self-pay | Admitting: Endocrinology

## 2017-07-20 ENCOUNTER — Ambulatory Visit: Payer: Medicare Other | Admitting: Endocrinology

## 2017-07-20 VITALS — BP 118/66 | HR 72 | Ht 69.0 in | Wt 177.8 lb

## 2017-07-20 DIAGNOSIS — E119 Type 2 diabetes mellitus without complications: Secondary | ICD-10-CM | POA: Diagnosis not present

## 2017-07-20 DIAGNOSIS — E7800 Pure hypercholesterolemia, unspecified: Secondary | ICD-10-CM

## 2017-07-20 DIAGNOSIS — E063 Autoimmune thyroiditis: Secondary | ICD-10-CM | POA: Diagnosis not present

## 2017-07-20 DIAGNOSIS — D508 Other iron deficiency anemias: Secondary | ICD-10-CM

## 2017-07-20 DIAGNOSIS — N289 Disorder of kidney and ureter, unspecified: Secondary | ICD-10-CM | POA: Diagnosis not present

## 2017-07-20 NOTE — Progress Notes (Signed)
Patient ID: James Juarez, male   DOB: 1940/07/27, 77 y.o.   MRN: [id number]   Reason for Appointment: follow-up   History of Present Illness   Diagnosis: Type 2 DIABETES MELITUS  Oral hypoglycemic drugs: Glipizide ER 5 mg, Actos and Janumet 50/1000 twice a day   He has had long-standing diabetes which has been well-controlled with the same 4 drug regimen which has been unchanged for several years A1c is usually upper normal and is stable at 6.6, previously 6.6  He is usually is watching portions and high fat foods and meat fairly consistently Does have protein with egg in the morning for breakfast Also has lost 6 pounds since his last visit This may be from his being quite regular with his walking and being more active in summer   GLUCOSE monitoring:  Glucometer:  Accu-Chek Aviva  His insurance does not cover the Accu-Chek guide  He has checked blood sugars at home only FASTING  Recent glucose range of 108-144 AVERAGE 121 Bedtime reading 98 last night  His glucose was 141 in the lab  Side effects from medications: None        Diet: Generally low fat and small portions        Physical activity: exercise: Walking 20 min, 5/7 days a week              Wt Readings from Last 3 Encounters:  07/20/17 177 lb 12.8 oz (80.6 kg)  12/25/16 183 lb 12.8 oz (83.4 kg)  09/27/16 179 lb (81.2 kg)     He has several other active problems which are discussed in review of systems   LABS:  Lab Results  Component Value Date   HGBA1C 6.5 07/17/2017   HGBA1C 6.6 (H) 12/21/2016   HGBA1C 5.9 (H) 09/13/2016   Lab Results  Component Value Date   MICROALBUR <0.7 12/21/2016   LDLCALC 74 07/17/2017   CREATININE 1.39 07/17/2017     Lab on 07/17/2017  Component Date Value Ref Range Status  . WBC 07/17/2017 5.6  4.0 - 10.5 K/uL Final  . RBC 07/17/2017 4.07* 4.22 - 5.81 Mil/uL Final  . Platelets 07/17/2017 213.0  150.0 - 400.0 K/uL Final  . Hemoglobin 07/17/2017 12.5* 13.0 -  17.0 g/dL Final  . HCT 07/17/2017 37.8* 39.0 - 52.0 % Final  . MCV 07/17/2017 92.9  78.0 - 100.0 fl Final  . MCHC 07/17/2017 33.1  30.0 - 36.0 g/dL Final  . RDW 07/17/2017 14.0  11.5 - 15.5 % Final  . Vitamin B-12 07/17/2017 300  211 - 911 pg/mL Final  . Free T4 07/17/2017 1.00  0.60 - 1.60 ng/dL Final   Comment: Specimens from patients who are undergoing biotin therapy and /or ingesting biotin supplements may contain high levels of biotin.  The higher biotin concentration in these specimens interferes with this Free T4 assay.  Specimens that contain high levels  of biotin may cause false high results for this Free T4 assay.  Please interpret results in light of the total clinical presentation of the patient.    [name] Kitchen TSH 07/17/2017 3.66  0.35 - 4.50 uIU/mL Final  . Cholesterol 07/17/2017 151  0 - 200 mg/dL Final   ATP III Classification       Desirable:  < 200 mg/dL               Borderline High:  200 - 239 mg/dL          High:  > =  240 mg/dL  . Triglycerides 07/17/2017 95.0  0.0 - 149.0 mg/dL Final   Normal:  <150 mg/dLBorderline High:  150 - 199 mg/dL  . HDL 07/17/2017 58.60  >39.00 mg/dL Final  . VLDL 07/17/2017 19.0  0.0 - 40.0 mg/dL Final  . LDL Cholesterol 07/17/2017 74  0 - 99 mg/dL Final  . Total CHOL/HDL Ratio 07/17/2017 3   Final                  Men          Women1/2 Average Risk     3.4          3.3Average Risk          5.0          4.42X Average Risk          9.6          7.13X Average Risk          15.0          11.0                      . NonHDL 07/17/2017 92.79   Final   NOTE:  Non-HDL goal should be 30 mg/dL higher than patient's LDL goal (i.e. LDL goal of < 70 mg/dL, would have non-HDL goal of < 100 mg/dL)  . Sodium 07/17/2017 138  135 - 145 mEq/L Final  . Potassium 07/17/2017 4.1  3.5 - 5.1 mEq/L Final  . Chloride 07/17/2017 102  96 - 112 mEq/L Final  . CO2 07/17/2017 30  19 - 32 mEq/L Final  . Glucose, Bld 07/17/2017 141* 70 - 99 mg/dL Final  . BUN 07/17/2017 25* 6 - 23  mg/dL Final  . Creatinine, Ser 07/17/2017 1.39  0.40 - 1.50 mg/dL Final  . Total Bilirubin 07/17/2017 0.6  0.2 - 1.2 mg/dL Final  . Alkaline Phosphatase 07/17/2017 47  39 - 117 U/L Final  . AST 07/17/2017 25  0 - 37 U/L Final  . ALT 07/17/2017 17  0 - 53 U/L Final  . Total Protein 07/17/2017 7.3  6.0 - 8.3 g/dL Final  . Albumin 07/17/2017 4.4  3.5 - 5.2 g/dL Final  . Calcium 07/17/2017 9.6  8.4 - 10.5 mg/dL Final  . GFR 07/17/2017 52.38* >60.00 mL/min Final  . Hgb A1c MFr Bld 07/17/2017 6.5  4.6 - 6.5 % Final   Glycemic Control Guidelines for People with Diabetes:Non Diabetic:  <6%Goal of Therapy: <7%Additional Action Suggested:  >8%   . Ferritin 07/19/2017 15.6* 22.0 - 322.0 ng/mL Final    Allergies as of 07/20/2017   No Known Allergies     Medication List        Accurate as of 07/20/17 11:13 AM. Always use your most recent med list.          ACCU-CHEK AVIVA PLUS w/Device Kit Use to check blood sugars 2 times daily, Dx Code E11.65   ACCU-CHEK FASTCLIX LANCETS Misc Use to check blood sugar 2 times per day dx code E11.65   acetaminophen 325 MG tablet Commonly known as:  TYLENOL Take 325 mg by mouth every 6 (six) hours as needed. For pain   albuterol 108 (90 Base) MCG/ACT inhaler Commonly known as:  PROVENTIL HFA;VENTOLIN HFA Inhale 2 puffs into the lungs every 6 (six) hours as needed. For shortness of breath   amLODipine 5 MG tablet Commonly known as:  NORVASC Take 1 tablet (5 mg total) by mouth  daily.   clobetasol 0.05 % Gel Commonly known as:  TEMOVATE Apply 1 application topically daily as needed. For face.   ferrous sulfate 325 (65 FE) MG EC tablet Take 325 mg by mouth daily with breakfast.   fexofenadine 180 MG tablet Commonly known as:  ALLEGRA Take 180 mg by mouth daily as needed. For allergies   glipiZIDE 5 MG 24 hr tablet Commonly known as:  GLUCOTROL XL TAKE 1 TABLET BY MOUTH EVERY DAY   glucose blood test strip Commonly known as:  ACCU-CHEK AVIVA  PLUS Use as instructed   hydroxypropyl methylcellulose / hypromellose 2.5 % ophthalmic solution Commonly known as:  ISOPTO TEARS / GONIOVISC Place 1 drop into both eyes at bedtime.   JANUMET 50-1000 MG tablet Generic drug:  sitaGLIPtin-metformin TAKE 1 TABLET BY MOUTH TWICE A DAY WITH A MEAL   levothyroxine 75 MCG tablet Commonly known as:  SYNTHROID, LEVOTHROID TAKE 1 TABLET BY MOUTH EVERY DAY BEFORE BREAKFAST   losartan 50 MG tablet Commonly known as:  COZAAR TAKE 1 TABLET BY MOUTH EVERY DAY   multivitamin capsule Take 1 capsule by mouth daily.   pioglitazone 30 MG tablet Commonly known as:  ACTOS TAKE 1 TABLET BY MOUTH EVERY DAY   rosuvastatin 5 MG tablet Commonly known as:  CRESTOR TAKE 1 TABLET BY MOUTH EVERY DAY       Allergies: No Known Allergies  Past Medical History:  Diagnosis Date  . Asthma   . Diabetes mellitus   . Diverticular disease   . Hypertension   . Subdural hematoma [facility]) May 2013   bilateral     Past Surgical History:  Procedure Laterality Date  . BURR HOLE  05/22/2011   Procedure: Haskell Flirt;  Surgeon: Elaina Hoops, MD;  Location: [place] [facility];  Service: Neurosurgery;  Laterality: Right;  Right Burr Holes for evacuation of subdural hematoma  . BURR HOLE  05/23/2011   Procedure: Haskell Flirt;  Surgeon: Elaina Hoops, MD;  Location: [place] [facility];  Service: Neurosurgery;  Laterality: Left;  Left [facility]  . CRANIOTOMY  06/14/2011   Procedure: CRANIOTOMY HEMATOMA EVACUATION SUBDURAL;  Surgeon: Elaina Hoops, MD;  Location: [place] [facility];  Service: Neurosurgery;  Laterality: N/A;  Left Craniotomy for subdural hematoma  . CRANIOTOMY  06/23/2011   Procedure: CRANIOTOMY HEMATOMA EVACUATION SUBDURAL;  Surgeon: Elaina Hoops, MD;  Location: [place] [facility];  Service: Neurosurgery;  Laterality: Left;  Redo Craniotomy for Subdural Hematoma  . HERNIA REPAIR    . INGUINAL HERNIA REPAIR Left 09/27/2016   Procedure: LAPAROSCOPIC LEFT INGUINAL HERNIA REPAIR;  Surgeon:  Clovis Riley, MD;  Location: [facility];  Service: General;  Laterality: Left;  . INSERTION OF MESH Left 09/27/2016   Procedure: INSERTION OF MESH;  Surgeon: Clovis Riley, MD;  Location: [facility];  Service: General;  Laterality: Left;    Family History  Problem Relation Age of Onset  . Diabetes Mother   . Diabetes Father   . Heart attack Father   . Diabetes Sister   . Diabetes Brother   . Heart attack Brother     Social History:  reports that he quit smoking about 42 years ago. He has never used smokeless tobacco. He reports that he does not drink alcohol or use drugs.  Review of Systems:  The patient is asking for detailed review of his labs and explanation of abnormal values. He wants all his labs done including studies for anemia here and has  question about all his medications and supplements  HYPERTENSION:  he has had hypertension for several years   Currently on AMLODIPINE 5 mg daily without any side effects He may have mild edema on ankles, asymmetrical occasionally  Previously on 50 mg losartan with good control but this was changed last December because of creatinine of 1.6 which was higher than usual  He does monitor blood pressure at home: Usual readings about 115-120 /75   RENAL dysfunction: His creatinine is improved with switching from losartan to amlodipine No changes made by PCP  Does not take any nonsteroidal anti-inflammatory drugs like Advil    Lab Results  Component Value Date   CREATININE 1.39 07/17/2017   CREATININE 1.60 (H) 12/21/2016   CREATININE 1.46 (H) 09/13/2016    HYPERLIPIDEMIA: The lipid abnormality consists of elevated LDL, well controlled with 5 mg Crestor which he has taken long-term LDL is excellent   Lab Results  Component Value Date   CHOL 151 07/17/2017   HDL 58.60 07/17/2017   LDLCALC 74 07/17/2017   TRIG 95.0 07/17/2017   CHOLHDL 3 07/17/2017    HYPOTHYROIDISM: he has had long-standing mild hypothyroidism since 1995  and since then has been on 75 mcg, 6/7 days a week No problems with recent fatigue    His dose was reduced by 1 tablet weekly when TSH was low normal at 0.48 but now TSH is gradually increasing  Thyroid levels as follows:  Lab Results  Component Value Date   TSH 3.66 07/17/2017   TSH 2.43 12/21/2016   TSH 0.48 05/29/2016   FREET4 1.00 07/17/2017   FREET4 1.08 12/21/2016   FREET4 1.20 10/27/2015    No symptoms of numbness in his feet. Diabetic foot exam done in 12/18  He has had persistent mild anemia which is multifactorial and has been given iron by PCP, taking a prescription form but only 3 days a week now  Ferritin level still low and only slightly better than before Last colonoscopy 2017  He is also consistently taking his B12 supplement for pernicious anemia   Lab Results  Component Value Date   WBC 5.6 07/17/2017   HGB 12.5 (L) 07/17/2017   HCT 37.8 (L) 07/17/2017   MCV 92.9 07/17/2017   PLT 213.0 07/17/2017    PRIMARY HYPOGONADISM: The following is a copy of the previous note:  He does not complain of unusual fatigue Previously had low testosterone level with minimal symptoms and his diagnosis was made in 2/14 with a free T level of 4.8, increased LH of 33 Testosterone was normal This has not been rechecked since he has refused to consider treatment  Lab Results  Component Value Date   TESTOSTERONE 311.21 03/30/2015      Examination:   BP 118/66 (BP Location: Left Arm, Patient Position: Sitting, Cuff Size: Normal)   Pulse 72   Ht 5' 9"  (1.753 m)   Wt 177 lb 12.8 oz (80.6 kg)   SpO2 98%   BMI 26.26 kg/m   Body mass index is 26.26 kg/m.    Does not look pale No detectable ankle edema Feet are normal to inspection  Repeat standing blood pressure 122/72  ASSESSMENT/ PLAN:   Diabetes type 2 with recent BMI 26  The patient's diabetes appears to be overall  well controlled with consistently upper normal A1c He is on multiple drugs and this has  been stable regimen A1c stable at 6.5 He has done better with more regular walking Although his fasting glucose  average looks higher than previously A1c has not increased Discussed that morning sugars are higher possibly from dawn phenomenon or increased gluconeogenesis at night No change in medication recommended Continue Janumet twice a day along with Actos  HYPERTENSION: Blood pressure is very well controlled with amlodipine and he monitors at home also All the blood pressure is low in the office there is no orthostasis and renal function is adequate   Renal dysfunction: This is improved with switching from losartan to amlodipine No history of recent microalbuminuria also Microalbumin will be rechecked on next visit  Reminded him to follow-up with PCP in 1 month to repeat renal function, blood pressure and consider nephrology consultation if needed  Hypothyroidism: TSH is normal gradually increasing with taking Synthroid 75 mcg, 6 days a week He will add another half a tablet weekly for now Discussed normal TSH levels    LIPIDS: excellent control of LDL and HDL  Anemia: Recommended that he have stool Hemoccult which has not been done since 2017 and this was given today Continues to have low iron levels and he needs to take it daily since his ferritin is low Again recommended that he follow-up with PCP    Total visit time for evaluation and management of multiple problems, counseling, review of labs and communication with PCP = 25 minutes  There are no Patient Instructions on file for this visit.   Elayne Snare 07/20/2017, 11:13 AM

## 2017-07-20 NOTE — Patient Instructions (Signed)
Take 1/2 Synthroid on Sundays also

## 2017-08-30 ENCOUNTER — Other Ambulatory Visit: Payer: Self-pay | Admitting: Endocrinology

## 2017-09-06 ENCOUNTER — Other Ambulatory Visit: Payer: Self-pay | Admitting: Endocrinology

## 2017-11-16 ENCOUNTER — Ambulatory Visit: Payer: Medicare Other | Admitting: Podiatry

## 2017-11-26 ENCOUNTER — Other Ambulatory Visit: Payer: Self-pay | Admitting: Endocrinology

## 2017-12-05 ENCOUNTER — Other Ambulatory Visit: Payer: Self-pay | Admitting: Endocrinology

## 2017-12-31 ENCOUNTER — Other Ambulatory Visit (INDEPENDENT_AMBULATORY_CARE_PROVIDER_SITE_OTHER): Payer: Medicare Other

## 2017-12-31 ENCOUNTER — Other Ambulatory Visit: Payer: Self-pay | Admitting: Endocrinology

## 2017-12-31 DIAGNOSIS — D508 Other iron deficiency anemias: Secondary | ICD-10-CM

## 2017-12-31 DIAGNOSIS — E7800 Pure hypercholesterolemia, unspecified: Secondary | ICD-10-CM | POA: Diagnosis not present

## 2017-12-31 DIAGNOSIS — E063 Autoimmune thyroiditis: Secondary | ICD-10-CM

## 2017-12-31 DIAGNOSIS — E119 Type 2 diabetes mellitus without complications: Secondary | ICD-10-CM

## 2017-12-31 LAB — COMPREHENSIVE METABOLIC PANEL
ALT: 17 U/L (ref 0–53)
AST: 24 U/L (ref 0–37)
Albumin: 4.3 g/dL (ref 3.5–5.2)
Alkaline Phosphatase: 51 U/L (ref 39–117)
BUN: 23 mg/dL (ref 6–23)
CO2: 26 mEq/L (ref 19–32)
Calcium: 9.4 mg/dL (ref 8.4–10.5)
Chloride: 103 mEq/L (ref 96–112)
Creatinine, Ser: 1.39 mg/dL (ref 0.40–1.50)
GFR: 52.32 mL/min — ABNORMAL LOW (ref >60.00)
Glucose, Bld: 128 mg/dL — ABNORMAL HIGH (ref 70–99)
Potassium: 4.1 mEq/L (ref 3.5–5.1)
Sodium: 138 mEq/L (ref 135–145)
Total Bilirubin: 0.5 mg/dL (ref 0.2–1.2)
Total Protein: 7 g/dL (ref 6.0–8.3)

## 2017-12-31 LAB — CBC
HCT: 37.4 % — ABNORMAL LOW (ref 39.0–52.0)
Hemoglobin: 12.5 g/dL — ABNORMAL LOW (ref 13.0–17.0)
MCHC: 33.4 g/dL (ref 30.0–36.0)
MCV: 92.9 fl (ref 78.0–100.0)
Platelets: 208 10*3/uL (ref 150.0–400.0)
RBC: 4.02 Mil/uL — ABNORMAL LOW (ref 4.22–5.81)
RDW: 13.4 % (ref 11.5–15.5)
WBC: 5.6 10*3/uL (ref 4.0–10.5)

## 2017-12-31 LAB — MICROALBUMIN / CREATININE URINE RATIO
Creatinine,U: 188.5 mg/dL
Microalb Creat Ratio: 0.9 mg/g (ref 0.0–30.0)
Microalb, Ur: 1.7 mg/dL (ref 0.0–1.9)

## 2017-12-31 LAB — HEMOGLOBIN A1C: Hgb A1c MFr Bld: 6.4 % (ref 4.6–6.5)

## 2017-12-31 LAB — LIPID PANEL
Cholesterol: 154 mg/dL (ref 0–200)
HDL: 56.7 mg/dL (ref >39.00)
LDL Cholesterol: 82 mg/dL (ref 0–99)
NonHDL: 97.66
Total CHOL/HDL Ratio: 3
Triglycerides: 79 mg/dL (ref 0.0–149.0)
VLDL: 15.8 mg/dL (ref 0.0–40.0)

## 2017-12-31 LAB — TSH: TSH: 1.56 u[IU]/mL (ref 0.35–4.50)

## 2017-12-31 LAB — IBC PANEL
Iron: 96 ug/dL (ref 42–165)
Saturation Ratios: 23.6 % (ref 20.0–50.0)
Transferrin: 290 mg/dL (ref 212.0–360.0)

## 2017-12-31 LAB — FERRITIN: Ferritin: 12.1 ng/mL — ABNORMAL LOW (ref 22.0–322.0)

## 2017-12-31 LAB — T4, FREE: Free T4: 1.04 ng/dL (ref 0.60–1.60)

## 2018-01-04 ENCOUNTER — Ambulatory Visit: Payer: Medicare Other | Admitting: Endocrinology

## 2018-01-04 ENCOUNTER — Encounter: Payer: Self-pay | Admitting: Endocrinology

## 2018-01-04 VITALS — BP 122/70 | HR 77 | Ht 69.0 in | Wt 177.2 lb

## 2018-01-04 DIAGNOSIS — E063 Autoimmune thyroiditis: Secondary | ICD-10-CM | POA: Diagnosis not present

## 2018-01-04 DIAGNOSIS — D508 Other iron deficiency anemias: Secondary | ICD-10-CM | POA: Diagnosis not present

## 2018-01-04 DIAGNOSIS — E7800 Pure hypercholesterolemia, unspecified: Secondary | ICD-10-CM

## 2018-01-04 DIAGNOSIS — E119 Type 2 diabetes mellitus without complications: Secondary | ICD-10-CM | POA: Diagnosis not present

## 2018-01-04 NOTE — Patient Instructions (Signed)
Check blood sugars on waking up 2 days a week  Also check blood sugars about 2 hours after meals and do this after different meals by rotation  Recommended blood sugar levels on waking up are 90-130 and about 2 hours after meal is 130-160  Please bring your blood sugar monitor to each visit, thank you   

## 2018-01-04 NOTE — Progress Notes (Signed)
Patient ID: James Juarez, male   DOB: Sep 28, 1940, 77 y.o.   MRN: [id number]   Reason for Appointment: follow-up   History of Present Illness   Diagnosis: Type 2 DIABETES MELITUS  Oral hypoglycemic drugs: Glipizide ER 5 mg, Actos and Janumet 50/1000 twice a day   He has had long-standing diabetes which has been well-controlled with the same 4 drug regimen which has been unchanged for several years  A1c is usually upper normal and is stable at 6.4   Has not had any recent weight change  Usually trying to walk 4 to 5 days a week  Although he has been reminded about checking his blood sugars after meals he is only doing so fasting and forgets to check after meal  Usually watching portions well in his diet No side effects like edema from Actos and no GI side effects from Janumet  GLUCOSE monitoring:  Glucometer:  Accu-Chek Aviva   FASTING blood sugar range 103-124, after breakfast 144 No readings after lunch or dinner AVERAGE of 7 readings is 120  His glucose was 128 fasting in the lab  Side effects from medications: None       Diet: Generally low fat and small portions        Physical activity: exercise: Walking 20 min, 5/7 days a week              Wt Readings from Last 3 Encounters:  01/04/18 177 lb 3.2 oz (80.4 kg)  07/20/17 177 lb 12.8 oz (80.6 kg)  12/25/16 183 lb 12.8 oz (83.4 kg)     He has several other active problems which are discussed in review of systems   LABS:  Lab Results  Component Value Date   HGBA1C 6.4 12/31/2017   HGBA1C 6.5 07/17/2017   HGBA1C 6.6 (H) 12/21/2016   Lab Results  Component Value Date   MICROALBUR 1.7 12/31/2017   LDLCALC 82 12/31/2017   CREATININE 1.39 12/31/2017     Lab on 12/31/2017  Component Date Value Ref Range Status  . Iron 12/31/2017 96  42 - 165 ug/dL Final  . Transferrin 12/31/2017 290.0  212.0 - 360.0 mg/dL Final  . Saturation Ratios 12/31/2017 23.6  20.0 - 50.0 % Final  . WBC 12/31/2017 5.6  4.0 -  10.5 K/uL Final  . RBC 12/31/2017 4.02* 4.22 - 5.81 Mil/uL Final  . Platelets 12/31/2017 208.0  150.0 - 400.0 K/uL Final  . Hemoglobin 12/31/2017 12.5* 13.0 - 17.0 g/dL Final  . HCT 12/31/2017 37.4* 39.0 - 52.0 % Final  . MCV 12/31/2017 92.9  78.0 - 100.0 fl Final  . MCHC 12/31/2017 33.4  30.0 - 36.0 g/dL Final  . RDW 12/31/2017 13.4  11.5 - 15.5 % Final  . Free T4 12/31/2017 1.04  0.60 - 1.60 ng/dL Final   Comment: Specimens from patients who are undergoing biotin therapy and /or ingesting biotin supplements may contain high levels of biotin.  The higher biotin concentration in these specimens interferes with this Free T4 assay.  Specimens that contain high levels  of biotin may cause false high results for this Free T4 assay.  Please interpret results in light of the total clinical presentation of the patient.    [name] Kitchen TSH 12/31/2017 1.56  0.35 - 4.50 uIU/mL Final  . Cholesterol 12/31/2017 154  0 - 200 mg/dL Final   ATP III Classification       Desirable:  < 200 mg/dL  Borderline High:  200 - 239 mg/dL          High:  > = 240 mg/dL  . Triglycerides 12/31/2017 79.0  0.0 - 149.0 mg/dL Final   Normal:  <150 mg/dLBorderline High:  150 - 199 mg/dL  . HDL 12/31/2017 56.70  >39.00 mg/dL Final  . VLDL 12/31/2017 15.8  0.0 - 40.0 mg/dL Final  . LDL Cholesterol 12/31/2017 82  0 - 99 mg/dL Final  . Total CHOL/HDL Ratio 12/31/2017 3   Final                  Men          Women1/2 Average Risk     3.4          3.3Average Risk          5.0          4.42X Average Risk          9.6          7.13X Average Risk          15.0          11.0                      . NonHDL 12/31/2017 97.66   Final   NOTE:  Non-HDL goal should be 30 mg/dL higher than patient's LDL goal (i.e. LDL goal of < 70 mg/dL, would have non-HDL goal of < 100 mg/dL)  . Sodium 12/31/2017 138  135 - 145 mEq/L Final  . Potassium 12/31/2017 4.1  3.5 - 5.1 mEq/L Final  . Chloride 12/31/2017 103  96 - 112 mEq/L Final  . CO2 12/31/2017 26   19 - 32 mEq/L Final  . Glucose, Bld 12/31/2017 128* 70 - 99 mg/dL Final  . BUN 12/31/2017 23  6 - 23 mg/dL Final  . Creatinine, Ser 12/31/2017 1.39  0.40 - 1.50 mg/dL Final  . Total Bilirubin 12/31/2017 0.5  0.2 - 1.2 mg/dL Final  . Alkaline Phosphatase 12/31/2017 51  39 - 117 U/L Final  . AST 12/31/2017 24  0 - 37 U/L Final  . ALT 12/31/2017 17  0 - 53 U/L Final  . Total Protein 12/31/2017 7.0  6.0 - 8.3 g/dL Final  . Albumin 12/31/2017 4.3  3.5 - 5.2 g/dL Final  . Calcium 12/31/2017 9.4  8.4 - 10.5 mg/dL Final  . GFR 12/31/2017 52.32* >60.00 mL/min Final  . Hgb A1c MFr Bld 12/31/2017 6.4  4.6 - 6.5 % Final   Glycemic Control Guidelines for People with Diabetes:Non Diabetic:  <6%Goal of Therapy: <7%Additional Action Suggested:  >8%   . Microalb, Ur 12/31/2017 1.7  0.0 - 1.9 mg/dL Final  . Creatinine,U 12/31/2017 188.5  mg/dL Final  . Microalb Creat Ratio 12/31/2017 0.9  0.0 - 30.0 mg/g Final  . Ferritin 12/31/2017 12.1* 22.0 - 322.0 ng/mL Final    Allergies as of 01/04/2018   No Known Allergies     Medication List       Accurate as of January 04, 2018  9:03 AM. Always use your most recent med list.        ACCU-CHEK AVIVA PLUS w/Device Kit Use to check blood sugars 2 times daily, Dx Code E11.65   ACCU-CHEK FASTCLIX LANCETS Misc Use to check blood sugar 2 times per day dx code E11.65   acetaminophen 325 MG tablet Commonly known as:  TYLENOL Take 325 mg by mouth every 6 (six) hours  as needed. For pain   albuterol 108 (90 Base) MCG/ACT inhaler Commonly known as:  PROVENTIL HFA;VENTOLIN HFA Inhale 2 puffs into the lungs every 6 (six) hours as needed. For shortness of breath   amLODipine 5 MG tablet Commonly known as:  NORVASC TAKE 1 TABLET BY MOUTH EVERY DAY   clobetasol 0.05 % Gel Commonly known as:  TEMOVATE Apply 1 application topically daily as needed. For face.   ferrous sulfate 325 (65 FE) MG EC tablet Take 325 mg by mouth daily with breakfast.     fexofenadine 180 MG tablet Commonly known as:  ALLEGRA Take 180 mg by mouth daily as needed. For allergies   glipiZIDE 5 MG 24 hr tablet Commonly known as:  GLUCOTROL XL TAKE 1 TABLET BY MOUTH EVERY DAY   hydroxypropyl methylcellulose / hypromellose 2.5 % ophthalmic solution Commonly known as:  ISOPTO TEARS / GONIOVISC Place 1 drop into both eyes at bedtime.   JANUMET 50-1000 MG tablet Generic drug:  sitaGLIPtin-metformin TAKE 1 TABLET BY MOUTH TWICE A DAY WITH A MEAL   levothyroxine 75 MCG tablet Commonly known as:  SYNTHROID, LEVOTHROID TAKE 1 TABLET BY MOUTH EVERY DAY BEFORE BREAKFAST   multivitamin capsule Take 1 capsule by mouth daily.   pioglitazone 30 MG tablet Commonly known as:  ACTOS TAKE 1 TABLET BY MOUTH EVERY DAY   rosuvastatin 5 MG tablet Commonly known as:  CRESTOR TAKE 1 TABLET BY MOUTH EVERY DAY       Allergies: No Known Allergies  Past Medical History:  Diagnosis Date  . Asthma   . Diabetes mellitus   . Diverticular disease   . Hypertension   . Subdural hematoma [facility]) May 2013   bilateral     Past Surgical History:  Procedure Laterality Date  . BURR HOLE  05/22/2011   Procedure: Haskell Flirt;  Surgeon: Elaina Hoops, MD;  Location: [place] [facility];  Service: Neurosurgery;  Laterality: Right;  Right Burr Holes for evacuation of subdural hematoma  . BURR HOLE  05/23/2011   Procedure: Haskell Flirt;  Surgeon: Elaina Hoops, MD;  Location: [place] [facility];  Service: Neurosurgery;  Laterality: Left;  Left [facility]  . CRANIOTOMY  06/14/2011   Procedure: CRANIOTOMY HEMATOMA EVACUATION SUBDURAL;  Surgeon: Elaina Hoops, MD;  Location: [place] [facility]UR[facility];  Service: Neurosurgery;  Laterality: N/A;  Left Craniotomy for subdural hematoma  . CRANIOTOMY  06/23/2011   Procedure: CRANIOTOMY HEMATOMA EVACUATION SUBDURAL;  Surgeon: Elaina Hoops, MD;  Location: [place] [facility]UR[facility] OR[facility];  Service: Neurosurgery;  Laterality: Left;  Redo Craniotomy for Subdural Hematoma  . HERNIA REPAIR    . INGUINAL  HERNIA REPAIR Left 09/27/2016   Procedure: LAPAROSCOPIC LEFT INGUINAL HERNIA REPAIR;  Surgeon: Clovis Riley, MD;  Location: [facility];  Service: General;  Laterality: Left;  . INSERTION OF MESH Left 09/27/2016   Procedure: INSERTION OF MESH;  Surgeon: Clovis Riley, MD;  Location: [facility];  Service: General;  Laterality: Left;    Family History  Problem Relation Age of Onset  . Diabetes Mother   . Diabetes Father   . Heart attack Father   . Diabetes Sister   . Diabetes Brother   . Heart attack Brother     Social History:  reports that he quit smoking about 43 years ago. He has never used smokeless tobacco. He reports that he does not drink alcohol or use drugs.  Review of Systems:  The patient is asking for detailed review of his  labs and explanation of abnormal values as before.   HYPERTENSION:  he has had hypertension for several years   Currently on AMLODIPINE 5 mg daily alone without any side effects like edema  Previously on 50 mg losartan with good control but this was because of creatinine of 1.6 in 12/18  He does monitor blood pressure at home with usually good numbers   RENAL dysfunction: His creatinine is consistently improved with switching from losartan to amlodipine No microalbuminuria   Lab Results  Component Value Date   CREATININE 1.39 12/31/2017   CREATININE 1.39 07/17/2017   CREATININE 1.60 (H) 12/21/2016    HYPERLIPIDEMIA: The lipid abnormality consists of elevated LDL, well controlled with 5 mg Crestor for several years LDL is excellent again   Lab Results  Component Value Date   CHOL 154 12/31/2017   HDL 56.70 12/31/2017   LDLCALC 82 12/31/2017   TRIG 79.0 12/31/2017   CHOLHDL 3 12/31/2017    HYPOTHYROIDISM: he has had long-standing mild hypothyroidism since 1995 and since then has been on 75 mcg prescription Does not complain of fatigue or weight change  Currently taking 6-1/2 tablets a week with improved thyroid levels  Thyroid  levels as follows:  Lab Results  Component Value Date   TSH 1.56 12/31/2017   TSH 3.66 07/17/2017   TSH 2.43 12/21/2016   FREET4 1.04 12/31/2017   FREET4 1.00 07/17/2017   FREET4 1.08 12/21/2016    No symptoms of numbness in his feet. Diabetic foot exam done in 12/18  He has had persistent mild anemia which is multifactorial and has been given iron by PCP, taking a prescription for this  Ferritin level still low despite normal iron saturation Last colonoscopy 2017 He did not send back the Hemoccult that was given to him in June 2019  He is also consistently taking his B12 supplement for pernicious anemia   Lab Results  Component Value Date   WBC 5.6 12/31/2017   HGB 12.5 (L) 12/31/2017   HCT 37.4 (L) 12/31/2017   MCV 92.9 12/31/2017   PLT 208.0 12/31/2017         Examination:   BP 122/70 (BP Location: Left Arm, Patient Position: Sitting, Cuff Size: Normal)   Pulse 77   Ht 5' 9"  (1.753 m)   Wt 177 lb 3.2 oz (80.4 kg)   SpO2 97%   BMI 26.17 kg/m   Body mass index is 26.17 kg/m.    Thyroid not palpable No lymphadenopathy in the neck   Diabetic Foot Exam - Simple   Simple Foot Form Diabetic Foot exam was performed with the following findings:  Yes 01/04/2018  9:23 AM  Visual Inspection No deformities, no ulcerations, no other skin breakdown bilaterally:  Yes Sensation Testing Intact to touch and monofilament testing bilaterally:  Yes Pulse Check Posterior Tibialis and Dorsalis pulse intact bilaterally:  Yes See comments:  Yes Comments Left dorsalis pedis difficult to palpate      ASSESSMENT/ PLAN:   Diabetes type 2 with recent BMI 26  See history of present illness for discussion of current diabetes management, blood sugar patterns and problems identified  He is on a stable 4 drug regimen with A1c 6.4 Weight is maintained and he is trying to walk for exercise Tends to have mildly increased fasting glucose averaging about 120 Recommend that he check  readings after meals more consistently also  HYPERTENSION: Blood pressure is controlled with amlodipine   Renal dysfunction: Creatinine stable and upper normal  Hypothyroidism:  TSH is normal and he will stay with Synthroid 75, 6-1/2 tablets a week    LIPIDS: excellent control of LDL and HDL  Anemia: Recommended that he have stool Hemoccult from his PCP     There are no Patient Instructions on file for this visit.   Elayne Snare 01/04/2018, 9:03 AM

## 2018-02-04 ENCOUNTER — Other Ambulatory Visit: Payer: Self-pay | Admitting: Endocrinology

## 2018-02-25 ENCOUNTER — Other Ambulatory Visit: Payer: Medicare Other

## 2018-02-28 ENCOUNTER — Ambulatory Visit: Payer: Medicare Other | Admitting: Endocrinology

## 2018-03-01 ENCOUNTER — Other Ambulatory Visit: Payer: Self-pay | Admitting: Endocrinology

## 2018-03-03 ENCOUNTER — Other Ambulatory Visit: Payer: Self-pay | Admitting: Endocrinology

## 2018-05-28 ENCOUNTER — Other Ambulatory Visit: Payer: Self-pay | Admitting: Endocrinology

## 2018-06-11 ENCOUNTER — Other Ambulatory Visit: Payer: Self-pay | Admitting: Endocrinology

## 2018-06-18 ENCOUNTER — Other Ambulatory Visit: Payer: Medicare Other

## 2018-06-21 ENCOUNTER — Ambulatory Visit: Payer: Medicare Other | Admitting: Endocrinology

## 2018-06-27 ENCOUNTER — Other Ambulatory Visit: Payer: Self-pay | Admitting: Endocrinology

## 2018-07-04 ENCOUNTER — Other Ambulatory Visit: Payer: Medicare Other

## 2018-07-08 ENCOUNTER — Ambulatory Visit: Payer: Medicare Other | Admitting: Endocrinology

## 2018-07-18 LAB — HM DIABETES EYE EXAM

## 2018-08-11 ENCOUNTER — Other Ambulatory Visit: Payer: Self-pay | Admitting: Endocrinology

## 2018-08-28 ENCOUNTER — Other Ambulatory Visit: Payer: Self-pay | Admitting: Endocrinology

## 2018-08-31 ENCOUNTER — Other Ambulatory Visit: Payer: Self-pay | Admitting: Endocrinology

## 2018-09-03 ENCOUNTER — Other Ambulatory Visit: Payer: Self-pay

## 2018-09-03 ENCOUNTER — Other Ambulatory Visit (INDEPENDENT_AMBULATORY_CARE_PROVIDER_SITE_OTHER): Payer: Medicare Other

## 2018-09-03 ENCOUNTER — Other Ambulatory Visit: Payer: Self-pay | Admitting: Endocrinology

## 2018-09-03 DIAGNOSIS — D508 Other iron deficiency anemias: Secondary | ICD-10-CM

## 2018-09-03 DIAGNOSIS — E119 Type 2 diabetes mellitus without complications: Secondary | ICD-10-CM

## 2018-09-03 DIAGNOSIS — E063 Autoimmune thyroiditis: Secondary | ICD-10-CM

## 2018-09-03 DIAGNOSIS — E7800 Pure hypercholesterolemia, unspecified: Secondary | ICD-10-CM

## 2018-09-03 LAB — CBC
HCT: 35.9 % — ABNORMAL LOW (ref 39.0–52.0)
Hemoglobin: 11.8 g/dL — ABNORMAL LOW (ref 13.0–17.0)
MCHC: 32.8 g/dL (ref 30.0–36.0)
MCV: 92.7 fl (ref 78.0–100.0)
Platelets: 211 10*3/uL (ref 150.0–400.0)
RBC: 3.87 Mil/uL — ABNORMAL LOW (ref 4.22–5.81)
RDW: 14.6 % (ref 11.5–15.5)
WBC: 5.1 10*3/uL (ref 4.0–10.5)

## 2018-09-03 LAB — URINALYSIS, ROUTINE W REFLEX MICROSCOPIC
Bilirubin Urine: NEGATIVE
Hgb urine dipstick: NEGATIVE
Ketones, ur: NEGATIVE
Leukocytes,Ua: NEGATIVE
Nitrite: NEGATIVE
RBC / HPF: NONE SEEN (ref 0–?)
Specific Gravity, Urine: 1.02 (ref 1.000–1.030)
Total Protein, Urine: NEGATIVE
Urine Glucose: NEGATIVE
Urobilinogen, UA: 0.2 (ref 0.0–1.0)
pH: 6 (ref 5.0–8.0)

## 2018-09-03 LAB — COMPREHENSIVE METABOLIC PANEL
ALT: 16 U/L (ref 0–53)
AST: 26 U/L (ref 0–37)
Albumin: 4.4 g/dL (ref 3.5–5.2)
Alkaline Phosphatase: 55 U/L (ref 39–117)
BUN: 22 mg/dL (ref 6–23)
CO2: 27 mEq/L (ref 19–32)
Calcium: 9.3 mg/dL (ref 8.4–10.5)
Chloride: 104 mEq/L (ref 96–112)
Creatinine, Ser: 1.34 mg/dL (ref 0.40–1.50)
GFR: 51.26 mL/min — ABNORMAL LOW (ref >60.00)
Glucose, Bld: 142 mg/dL — ABNORMAL HIGH (ref 70–99)
Potassium: 4.4 mEq/L (ref 3.5–5.1)
Sodium: 137 mEq/L (ref 135–145)
Total Bilirubin: 0.5 mg/dL (ref 0.2–1.2)
Total Protein: 6.9 g/dL (ref 6.0–8.3)

## 2018-09-03 LAB — HEMOGLOBIN A1C: Hgb A1c MFr Bld: 6.4 % (ref 4.6–6.5)

## 2018-09-03 LAB — MICROALBUMIN / CREATININE URINE RATIO
Creatinine,U: 190.4 mg/dL
Microalb Creat Ratio: 1.6 mg/g (ref 0.0–30.0)
Microalb, Ur: 3 mg/dL — ABNORMAL HIGH (ref 0.0–1.9)

## 2018-09-03 LAB — T4, FREE: Free T4: 1.15 ng/dL (ref 0.60–1.60)

## 2018-09-03 LAB — FERRITIN: Ferritin: 14 ng/mL — ABNORMAL LOW (ref 22.0–322.0)

## 2018-09-03 LAB — LIPID PANEL
Cholesterol: 166 mg/dL (ref 0–200)
HDL: 55.8 mg/dL (ref >39.00)
LDL Cholesterol: 93 mg/dL (ref 0–99)
NonHDL: 110.41
Total CHOL/HDL Ratio: 3
Triglycerides: 88 mg/dL (ref 0.0–149.0)
VLDL: 17.6 mg/dL (ref 0.0–40.0)

## 2018-09-03 LAB — TSH: TSH: 2.26 u[IU]/mL (ref 0.35–4.50)

## 2018-09-05 ENCOUNTER — Encounter: Payer: Self-pay | Admitting: Endocrinology

## 2018-09-05 ENCOUNTER — Other Ambulatory Visit: Payer: Self-pay

## 2018-09-05 ENCOUNTER — Ambulatory Visit (INDEPENDENT_AMBULATORY_CARE_PROVIDER_SITE_OTHER): Payer: Medicare Other | Admitting: Endocrinology

## 2018-09-05 VITALS — BP 124/62 | HR 81 | Ht 69.0 in | Wt 176.0 lb

## 2018-09-05 DIAGNOSIS — E063 Autoimmune thyroiditis: Secondary | ICD-10-CM

## 2018-09-05 DIAGNOSIS — D508 Other iron deficiency anemias: Secondary | ICD-10-CM

## 2018-09-05 DIAGNOSIS — E7800 Pure hypercholesterolemia, unspecified: Secondary | ICD-10-CM

## 2018-09-05 DIAGNOSIS — E119 Type 2 diabetes mellitus without complications: Secondary | ICD-10-CM

## 2018-09-05 NOTE — Patient Instructions (Signed)
Check blood sugars on waking up days a week  Also check blood sugars about 2 hours after meals and do this after different meals by rotation  Recommended blood sugar levels on waking up are 90-130 and about 2 hours after meal is 130-160  Please bring your blood sugar monitor to each visit, thank you  B 12 500mg  daily

## 2018-09-05 NOTE — Progress Notes (Signed)
Patient ID: James Juarez, male   DOB: 05/25/1940, 78 y.o.   MRN: [id number]   Reason for Appointment: follow-up of various problems  History of Present Illness   Diagnosis: Type 2 DIABETES MELITUS  Oral hypoglycemic drugs: Glipizide ER 5 mg, Actos 30 mg, and Janumet 50/1000 twice a day   He has had long-standing diabetes which has been well-controlled with the same 4 drug regimen which has been unchanged for several years  A1c is usually upper normal and is about the same at 6.4   Has not had any changes made to his medication for quite some time  He is taking all the medications as prescribed recent weight change  As before he is trying to walk about 4  days a week at least 20 minutes  Has excellent blood sugars at home in the mornings although usually over 100  He had 3 readings after dinner at night recently with one high reading of 192  Usually diet is fairly good but occasionally higher in carbohydrate  No significant weight change His glucose was 142 fasting in the lab but he usually checks his blood sugar earlier in the morning at home  GLUCOSE monitoring:  Glucometer:  Accu-Chek Aviva   FASTING blood sugar range 87-116 Nonfasting 110-192 with only 3 readings late evening  Overall AVERAGE 118   Side effects from medications: None       Diet: Generally low fat and small portions        Physical activity: exercise: Walking 20 min, 4/7 days a week              Wt Readings from Last 3 Encounters:  09/05/18 176 lb (79.8 kg)  01/04/18 177 lb 3.2 oz (80.4 kg)  07/20/17 177 lb 12.8 oz (80.6 kg)     He has several other active problems which are discussed in review of systems   LABS:  Lab Results  Component Value Date   HGBA1C 6.4 09/03/2018   HGBA1C 6.4 12/31/2017   HGBA1C 6.5 07/17/2017   Lab Results  Component Value Date   MICROALBUR 3.0 (H) 09/03/2018   LDLCALC 93 09/03/2018   CREATININE 1.34 09/03/2018     Lab on 09/03/2018  Component  Date Value Ref Range Status  . Ferritin 09/03/2018 14.0* 22.0 - 322.0 ng/mL Final  Lab on 09/03/2018  Component Date Value Ref Range Status  . WBC 09/03/2018 5.1  4.0 - 10.5 K/uL Final  . RBC 09/03/2018 3.87* 4.22 - 5.81 Mil/uL Final  . Platelets 09/03/2018 211.0  150.0 - 400.0 K/uL Final  . Hemoglobin 09/03/2018 11.8* 13.0 - 17.0 g/dL Final  . HCT 09/03/2018 35.9* 39.0 - 52.0 % Final  . MCV 09/03/2018 92.7  78.0 - 100.0 fl Final  . MCHC 09/03/2018 32.8  30.0 - 36.0 g/dL Final  . RDW 09/03/2018 14.6  11.5 - 15.5 % Final  . Free T4 09/03/2018 1.15  0.60 - 1.60 ng/dL Final   Comment: Specimens from patients who are undergoing biotin therapy and /or ingesting biotin supplements may contain high levels of biotin.  The higher biotin concentration in these specimens interferes with this Free T4 assay.  Specimens that contain high levels  of biotin may cause false high results for this Free T4 assay.  Please interpret results in light of the total clinical presentation of the patient.    [name] Kitchen TSH 09/03/2018 2.26  0.35 - 4.50 uIU/mL Final  . Cholesterol 09/03/2018 166  0 - 200  mg/dL Final   ATP III Classification       Desirable:  < 200 mg/dL               Borderline High:  200 - 239 mg/dL          High:  > = 240 mg/dL  . Triglycerides 09/03/2018 88.0  0.0 - 149.0 mg/dL Final   Normal:  <150 mg/dLBorderline High:  150 - 199 mg/dL  . HDL 09/03/2018 55.80  >39.00 mg/dL Final  . VLDL 09/03/2018 17.6  0.0 - 40.0 mg/dL Final  . LDL Cholesterol 09/03/2018 93  0 - 99 mg/dL Final  . Total CHOL/HDL Ratio 09/03/2018 3   Final                  Men          Women1/2 Average Risk     3.4          3.3Average Risk          5.0          4.42X Average Risk          9.6          7.13X Average Risk          15.0          11.0                      . NonHDL 09/03/2018 110.41   Final   NOTE:  Non-HDL goal should be 30 mg/dL higher than patient's LDL goal (i.e. LDL goal of < 70 mg/dL, would have non-HDL goal of < 100  mg/dL)  . Sodium 09/03/2018 137  135 - 145 mEq/L Final  . Potassium 09/03/2018 4.4  3.5 - 5.1 mEq/L Final  . Chloride 09/03/2018 104  96 - 112 mEq/L Final  . CO2 09/03/2018 27  19 - 32 mEq/L Final  . Glucose, Bld 09/03/2018 142* 70 - 99 mg/dL Final  . BUN 09/03/2018 22  6 - 23 mg/dL Final  . Creatinine, Ser 09/03/2018 1.34  0.40 - 1.50 mg/dL Final  . Total Bilirubin 09/03/2018 0.5  0.2 - 1.2 mg/dL Final  . Alkaline Phosphatase 09/03/2018 55  39 - 117 U/L Final  . AST 09/03/2018 26  0 - 37 U/L Final  . ALT 09/03/2018 16  0 - 53 U/L Final  . Total Protein 09/03/2018 6.9  6.0 - 8.3 g/dL Final  . Albumin 09/03/2018 4.4  3.5 - 5.2 g/dL Final  . Calcium 09/03/2018 9.3  8.4 - 10.5 mg/dL Final  . GFR 09/03/2018 51.26* >60.00 mL/min Final  . Hgb A1c MFr Bld 09/03/2018 6.4  4.6 - 6.5 % Final   Glycemic Control Guidelines for People with Diabetes:Non Diabetic:  <6%Goal of Therapy: <7%Additional Action Suggested:  >8%   . Color, Urine 09/03/2018 YELLOW  Yellow;Lt. Yellow;Straw;Dark Yellow;Amber;Green;Red;Brown Final  . APPearance 09/03/2018 CLEAR  Clear;Turbid;Slightly Cloudy;Cloudy Final  . Specific Gravity, Urine 09/03/2018 1.020  1.000 - 1.030 Final  . pH 09/03/2018 6.0  5.0 - 8.0 Final  . Total Protein, Urine 09/03/2018 NEGATIVE  Negative Final  . Urine Glucose 09/03/2018 NEGATIVE  Negative Final  . Ketones, ur 09/03/2018 NEGATIVE  Negative Final  . Bilirubin Urine 09/03/2018 NEGATIVE  Negative Final  . Hgb urine dipstick 09/03/2018 NEGATIVE  Negative Final  . Urobilinogen, UA 09/03/2018 0.2  0.0 - 1.0 Final  . Leukocytes,Ua 09/03/2018 NEGATIVE  Negative Final  . Nitrite 09/03/2018  NEGATIVE  Negative Final  . WBC, UA 09/03/2018 0-2/hpf  0-2/hpf Final  . RBC / HPF 09/03/2018 none seen  0-2/hpf Final  . Mucus, UA 09/03/2018 Presence of* None Final  . Squamous Epithelial / LPF 09/03/2018 Rare(0-4/hpf)  Rare(0-4/hpf) Final  . Hyaline Casts, UA 09/03/2018 Presence of* None Final  . Microalb, Ur  09/03/2018 3.0* 0.0 - 1.9 mg/dL Final  . Creatinine,U 09/03/2018 190.4  mg/dL Final  . Microalb Creat Ratio 09/03/2018 1.6  0.0 - 30.0 mg/g Final    Allergies as of 09/05/2018   No Known Allergies     Medication List       Accurate as of September 05, 2018  1:23 PM. If you have any questions, ask your nurse or doctor.        Accu-Chek Aviva Plus w/Device Kit Use to check blood sugars 2 times daily, Dx Code E11.65   Accu-Chek FastClix Lancets Misc Use to check blood sugar 2 times per day dx code E11.65   acetaminophen 325 MG tablet Commonly known as: TYLENOL Take 325 mg by mouth every 6 (six) hours as needed. For pain   albuterol 108 (90 Base) MCG/ACT inhaler Commonly known as: VENTOLIN HFA Inhale 2 puffs into the lungs every 6 (six) hours as needed. For shortness of breath   amLODipine 5 MG tablet Commonly known as: NORVASC TAKE 1 TABLET BY MOUTH EVERY DAY   clobetasol 0.05 % Gel Commonly known as: TEMOVATE Apply 1 application topically daily as needed. For face.   ferrous sulfate 325 (65 FE) MG EC tablet Take 325 mg by mouth daily with breakfast.   fexofenadine 180 MG tablet Commonly known as: ALLEGRA Take 180 mg by mouth daily as needed. For allergies   glipiZIDE 5 MG 24 hr tablet Commonly known as: GLUCOTROL XL TAKE 1 TABLET BY MOUTH EVERY DAY   hydroxypropyl methylcellulose / hypromellose 2.5 % ophthalmic solution Commonly known as: ISOPTO TEARS / GONIOVISC Place 1 drop into both eyes at bedtime.   Janumet 50-1000 MG tablet Generic drug: sitaGLIPtin-metformin TAKE 1 TABLET BY MOUTH TWICE A DAY WITH MEALS   levothyroxine 75 MCG tablet Commonly known as: SYNTHROID TAKE 1 TABLET BY MOUTH EVERY DAY BEFORE BREAKFAST   multivitamin capsule Take 1 capsule by mouth daily.   pioglitazone 30 MG tablet Commonly known as: ACTOS TAKE 1 TABLET BY MOUTH EVERY DAY   rosuvastatin 5 MG tablet Commonly known as: CRESTOR TAKE 1 TABLET BY MOUTH EVERY DAY        Allergies: No Known Allergies  Past Medical History:  Diagnosis Date  . Asthma   . Diabetes mellitus   . Diverticular disease   . Hypertension   . Subdural hematoma [facility]) May 2013   bilateral     Past Surgical History:  Procedure Laterality Date  . BURR HOLE  05/22/2011   Procedure: Haskell Flirt;  Surgeon: Elaina Hoops, MD;  Location: [place] [facility];  Service: Neurosurgery;  Laterality: Right;  Right Burr Holes for evacuation of subdural hematoma  . BURR HOLE  05/23/2011   Procedure: Haskell Flirt;  Surgeon: Elaina Hoops, MD;  Location: [place] [facility] OR[facility];  Service: Neurosurgery;  Laterality: Left;  Left [facility]  . CRANIOTOMY  06/14/2011   Procedure: CRANIOTOMY HEMATOMA EVACUATION SUBDURAL;  Surgeon: Elaina Hoops, MD;  Location: [place] NEURO ORS;  Service: Neurosurgery;  Laterality: N/A;  Left Craniotomy for subdural hematoma  . CRANIOTOMY  06/23/2011   Procedure: CRANIOTOMY HEMATOMA EVACUATION SUBDURAL;  Surgeon: Dominica Severin  Levy Sjogren, MD;  Location: [place] NEUR[facility] ORS;  Service: Neurosurgery;  Laterality: Left;  Redo Craniotomy for Subdural Hematoma  . HERNIA REPAIR    . INGUINAL HERNIA REPAIR Left 09/27/2016   Procedure: LAPAROSCOPIC LEFT INGUINAL HERNIA REPAIR;  Surgeon: Clovis Riley, MD;  Location: [facility] ORS;  Service: General;  Laterality: Left;  . INSERTION OF MESH Left 09/27/2016   Procedure: INSERTION OF MESH;  Surgeon: Clovis Riley, MD;  Location: [facility] ORS;  Service: General;  Laterality: Left;    Family History  Problem Relation Age of Onset  . Diabetes Mother   . Diabetes Father   . Heart attack Father   . Diabetes Sister   . Diabetes Brother   . Heart attack Brother     Social History:  reports that he quit smoking about 43 years ago. He has never used smokeless tobacco. He reports that he does not drink alcohol or use drugs.  Review of Systems:  The patient is asking for detailed review of his labs and explanation of abnormal values and recommendations   HYPERTENSION:  he has had  hypertension for several years   Currently on AMLODIPINE 5 mg daily alone  Previously on 50 mg losartan with good control but this was because of creatinine of 1.6 in 12/18  He does monitor blood pressure at home, usually normal   RENAL dysfunction: His creatinine is improved with switching from losartan to amlodipine although still upper normal No microalbuminuria   Lab Results  Component Value Date   CREATININE 1.34 09/03/2018   CREATININE 1.39 12/31/2017   CREATININE 1.39 07/17/2017    HYPERLIPIDEMIA: The lipid abnormality consists of elevated LDL, well controlled with 5 mg Crestor for several years LDL is below 100 again   Lab Results  Component Value Date   CHOL 166 09/03/2018   HDL 55.80 09/03/2018   LDLCALC 93 09/03/2018   TRIG 88.0 09/03/2018   CHOLHDL 3 09/03/2018    HYPOTHYROIDISM: he has had long-standing mild hypothyroidism since 1995  since then has been on 75 mcg dosage consistently Does not complain of unusual fatigue  Currently taking 6-1/2 tablets a week with stable thyroid levels  Thyroid levels as follows:  Lab Results  Component Value Date   TSH 2.26 09/03/2018   TSH 1.56 12/31/2017   TSH 3.66 07/17/2017   FREET4 1.15 09/03/2018   FREET4 1.04 12/31/2017   FREET4 1.00 07/17/2017    No symptoms of numbness in his feet. Diabetic foot exam done in 12/2017  He has had persistent mild anemia which is multifactorial and has been given iron by PCP, taking a prescription for this  Ferritin level still low  Last colonoscopy 2017  Previously was taking his B12 supplement for pernicious anemia but has not been taking it for some time and his hemoglobin is lower He is asking about treatment for this, has not had any recommendations made by PCP   CBC Latest Ref Rng & Units 09/03/2018 12/31/2017 07/17/2017  WBC 4.0 - 10.5 K/uL 5.1 5.6 5.6  Hemoglobin 13.0 - 17.0 g/dL 11.8(L) 12.5(L) 12.5(L)  Hematocrit 39.0 - 52.0 % 35.9(L) 37.4(L) 37.8(L)  Platelets  150.0 - 400.0 K/uL 211.0 208.0 213.0    Lab Results  Component Value Date   VITAMINB12 300 07/17/2017       Examination:   BP 124/62 (BP Location: Left Arm, Patient Position: Sitting, Cuff Size: Normal)   Pulse 81   Ht _0  (1.753 m)   Wt 176 lb (  79.8 kg)   SpO2 98%   BMI 25.99 kg/m   Body mass index is 25.99 kg/m.      ASSESSMENT/ PLAN:   Diabetes type 2 with recent BMI 26  See history of present illness for discussion of current diabetes management, blood sugar patterns and problems identified  He is on a stable 4 drug regimen with A1c 6.4 again  Doing well with the same regimen of glipizide, Actos and Janumet  Blood sugars are excellent at home although lab glucose was 142 fasting  Has only 3 readings after dinner and only once this was high Again reminded him to check more readings after meals  HYPERTENSION: Blood pressure is consistently well controlled with amlodipine   Renal dysfunction: Creatinine stable and upper normal  Hypothyroidism: TSH is normal and he will stay with Synthroid 75, 6-1/2 tablets a week    LIPIDS: excellent control of LDL and HDL  Anemia: Resume B12 as he is on chronic metformin therapy and likely has mild deficiency  Needs stool Hemoccult from his PCP     There are no Patient Instructions on file for this visit.   Elayne Snare 09/05/2018, 1:23 PM

## 2018-11-25 ENCOUNTER — Other Ambulatory Visit: Payer: Self-pay | Admitting: Endocrinology

## 2018-12-16 ENCOUNTER — Other Ambulatory Visit: Payer: Self-pay | Admitting: Endocrinology

## 2018-12-24 ENCOUNTER — Other Ambulatory Visit: Payer: Self-pay | Admitting: Endocrinology

## 2019-02-05 ENCOUNTER — Other Ambulatory Visit: Payer: Self-pay | Admitting: Endocrinology

## 2019-02-17 ENCOUNTER — Other Ambulatory Visit: Payer: Self-pay | Admitting: Endocrinology

## 2019-02-28 ENCOUNTER — Other Ambulatory Visit: Payer: Self-pay | Admitting: Endocrinology

## 2019-03-01 ENCOUNTER — Other Ambulatory Visit: Payer: Self-pay | Admitting: Endocrinology

## 2019-03-10 ENCOUNTER — Other Ambulatory Visit: Payer: Medicare Other

## 2019-03-11 ENCOUNTER — Other Ambulatory Visit: Payer: Self-pay | Admitting: Endocrinology

## 2019-03-13 ENCOUNTER — Ambulatory Visit: Payer: Medicare Other | Admitting: Endocrinology

## 2019-03-25 ENCOUNTER — Other Ambulatory Visit: Payer: Self-pay | Admitting: Neurosurgery

## 2019-03-25 DIAGNOSIS — S060X0A Concussion without loss of consciousness, initial encounter: Secondary | ICD-10-CM

## 2019-03-26 ENCOUNTER — Ambulatory Visit
Admission: RE | Admit: 2019-03-26 | Discharge: 2019-03-26 | Disposition: A | Discharge status: Home / Self Care | Payer: Medicare PPO | Source: Ambulatory Visit | Attending: Neurosurgery | Admitting: Neurosurgery

## 2019-03-26 DIAGNOSIS — S060X0A Concussion without loss of consciousness, initial encounter: Secondary | ICD-10-CM

## 2019-04-09 LAB — HM DIABETES EYE EXAM

## 2019-06-09 DIAGNOSIS — K047 Periapical abscess without sinus: Secondary | ICD-10-CM | POA: Diagnosis not present

## 2019-06-09 DIAGNOSIS — R220 Localized swelling, mass and lump, head: Secondary | ICD-10-CM | POA: Diagnosis not present

## 2019-06-12 ENCOUNTER — Telehealth: Payer: Self-pay | Admitting: Endocrinology

## 2019-06-12 ENCOUNTER — Other Ambulatory Visit: Payer: Self-pay | Admitting: Endocrinology

## 2019-06-12 NOTE — Telephone Encounter (Signed)
This will not affect his labs

## 2019-06-12 NOTE — Telephone Encounter (Signed)
Called pt and gave him MD message. Pt verbalized understanding. 

## 2019-06-12 NOTE — Telephone Encounter (Signed)
Patient is taking Clindamycin 150 mg antibiotic for dental issues but he's scheduled to have labs done Monday and he wanted to make sure there was no issues taking this and getting blood drawn. Patient would like to be called back at [phone].

## 2019-06-16 ENCOUNTER — Other Ambulatory Visit: Payer: Self-pay | Admitting: Endocrinology

## 2019-06-16 ENCOUNTER — Other Ambulatory Visit: Payer: Self-pay

## 2019-06-16 ENCOUNTER — Other Ambulatory Visit (INDEPENDENT_AMBULATORY_CARE_PROVIDER_SITE_OTHER): Payer: Medicare PPO

## 2019-06-16 DIAGNOSIS — D508 Other iron deficiency anemias: Secondary | ICD-10-CM

## 2019-06-16 DIAGNOSIS — E063 Autoimmune thyroiditis: Secondary | ICD-10-CM

## 2019-06-16 DIAGNOSIS — E119 Type 2 diabetes mellitus without complications: Secondary | ICD-10-CM

## 2019-06-16 DIAGNOSIS — D513 Other dietary vitamin B12 deficiency anemia: Secondary | ICD-10-CM

## 2019-06-16 DIAGNOSIS — E559 Vitamin D deficiency, unspecified: Secondary | ICD-10-CM

## 2019-06-16 DIAGNOSIS — E7800 Pure hypercholesterolemia, unspecified: Secondary | ICD-10-CM

## 2019-06-16 LAB — COMPREHENSIVE METABOLIC PANEL
ALT: 31 U/L (ref 0–53)
AST: 32 U/L (ref 0–37)
Albumin: 4.2 g/dL (ref 3.5–5.2)
Alkaline Phosphatase: 63 U/L (ref 39–117)
BUN: 23 mg/dL (ref 6–23)
CO2: 27 mEq/L (ref 19–32)
Calcium: 9.5 mg/dL (ref 8.4–10.5)
Chloride: 103 mEq/L (ref 96–112)
Creatinine, Ser: 1.3 mg/dL (ref 0.40–1.50)
GFR: 52.98 mL/min — ABNORMAL LOW (ref >60.00)
Glucose, Bld: 112 mg/dL — ABNORMAL HIGH (ref 70–99)
Potassium: 4.8 mEq/L (ref 3.5–5.1)
Sodium: 135 mEq/L (ref 135–145)
Total Bilirubin: 0.4 mg/dL (ref 0.2–1.2)
Total Protein: 7.2 g/dL (ref 6.0–8.3)

## 2019-06-16 LAB — LIPID PANEL
Cholesterol: 150 mg/dL (ref 0–200)
HDL: 50.7 mg/dL (ref >39.00)
LDL Cholesterol: 85 mg/dL (ref 0–99)
NonHDL: 99.37
Total CHOL/HDL Ratio: 3
Triglycerides: 73 mg/dL (ref 0.0–149.0)
VLDL: 14.6 mg/dL (ref 0.0–40.0)

## 2019-06-16 LAB — IBC PANEL
Iron: 86 ug/dL (ref 42–165)
Saturation Ratios: 19.7 % — ABNORMAL LOW (ref 20.0–50.0)
Transferrin: 312 mg/dL (ref 212.0–360.0)

## 2019-06-16 LAB — VITAMIN B12: Vitamin B-12: 374 pg/mL (ref 211–911)

## 2019-06-16 LAB — CBC
HCT: 34.2 % — ABNORMAL LOW (ref 39.0–52.0)
Hemoglobin: 11.4 g/dL — ABNORMAL LOW (ref 13.0–17.0)
MCHC: 33.3 g/dL (ref 30.0–36.0)
MCV: 91.4 fl (ref 78.0–100.0)
Platelets: 272 10*3/uL (ref 150.0–400.0)
RBC: 3.75 Mil/uL — ABNORMAL LOW (ref 4.22–5.81)
RDW: 13.5 % (ref 11.5–15.5)
WBC: 5.4 10*3/uL (ref 4.0–10.5)

## 2019-06-16 LAB — TSH: TSH: 1.88 u[IU]/mL (ref 0.35–4.50)

## 2019-06-16 LAB — T4, FREE: Free T4: 1.1 ng/dL (ref 0.60–1.60)

## 2019-06-16 LAB — VITAMIN D 25 HYDROXY (VIT D DEFICIENCY, FRACTURES): VITD: 89.04 ng/mL (ref 30.00–100.00)

## 2019-06-16 LAB — HEMOGLOBIN A1C: Hgb A1c MFr Bld: 6.5 % (ref 4.6–6.5)

## 2019-06-17 LAB — MICROALBUMIN / CREATININE URINE RATIO
Creatinine,U: 97.2 mg/dL
Microalb Creat Ratio: 0.7 mg/g (ref 0.0–30.0)
Microalb, Ur: <0.7 mg/dL (ref 0.0–1.9)

## 2019-06-17 LAB — FERRITIN: Ferritin: 20.6 ng/mL — ABNORMAL LOW (ref 22.0–322.0)

## 2019-06-19 ENCOUNTER — Other Ambulatory Visit: Payer: Self-pay | Admitting: Endocrinology

## 2019-06-20 ENCOUNTER — Other Ambulatory Visit: Payer: Self-pay

## 2019-06-20 ENCOUNTER — Encounter: Payer: Self-pay | Admitting: Endocrinology

## 2019-06-20 ENCOUNTER — Ambulatory Visit: Payer: Medicare PPO | Admitting: Endocrinology

## 2019-06-20 VITALS — BP 120/62 | HR 75 | Ht 69.0 in | Wt 173.0 lb

## 2019-06-20 DIAGNOSIS — E7800 Pure hypercholesterolemia, unspecified: Secondary | ICD-10-CM

## 2019-06-20 DIAGNOSIS — E119 Type 2 diabetes mellitus without complications: Secondary | ICD-10-CM | POA: Diagnosis not present

## 2019-06-20 DIAGNOSIS — E063 Autoimmune thyroiditis: Secondary | ICD-10-CM

## 2019-06-20 DIAGNOSIS — D508 Other iron deficiency anemias: Secondary | ICD-10-CM | POA: Insufficient documentation

## 2019-06-20 NOTE — Patient Instructions (Addendum)
Reduce D to 2/7   Check blood sugars on waking up 3 days a week  Also check blood sugars about 2 hours after meals and do this after different meals by rotation  Recommended blood sugar levels on waking up are 90-130 and about 2 hours after meal is 130-160  Please bring your blood sugar monitor to each visit, thank you

## 2019-06-20 NOTE — Progress Notes (Signed)
Patient ID: James Juarez, male   DOB: [birth year], 79 y.o.   MRN: [id number]   Reason for Appointment: follow-up of various problems  History of Present Illness   Diagnosis: Type 2 DIABETES MELITUS  Oral hypoglycemic drugs: Glipizide ER 5 mg, Actos 30 mg, and Janumet 50/1000 twice a day   He has had long-standing diabetes which has been well-controlled with the same 4 drug regimen which has been unchanged for several years  A1c is usually upper normal and is about the same at 6.5   Has not brought his blood sugar monitor for download today  Likely checking readings only in the morning as he does not remember any readings after meals  His 3 drug regimen was continued unchanged on the last visit  Usually keeping portions controlled with his meals  Fasting glucose was 112  He is still trying to walk almost every day  His weight is down 3 pounds   GLUCOSE monitoring:  Glucometer:  Accu-Chek Aviva   FASTING blood sugar range recently by recall 105-112, ? Hs    Side effects from medications: None       Diet: Generally low fat and small portions        Physical activity: exercise: Walking 20 min, 4-6/7 days a week              Wt Readings from Last 3 Encounters:  06/20/19 173 lb (78.5 kg)  09/05/18 176 lb (79.8 kg)  01/04/18 177 lb 3.2 oz (80.4 kg)     He has several other active problems which are discussed in review of systems   LABS:  Lab Results  Component Value Date   HGBA1C 6.5 06/16/2019   HGBA1C 6.4 09/03/2018   HGBA1C 6.4 12/31/2017   Lab Results  Component Value Date   MICROALBUR <0.7 06/16/2019   Normal 85 06/16/2019   CREATININE 1.30 06/16/2019     Lab on 06/16/2019  Component Date Value Ref Range Status  . Iron 06/16/2019 86  42 - 165 ug/dL Final  . Transferrin 06/16/2019 312.0  212.0 - 360.0 mg/dL Final  . Saturation Ratios 06/16/2019 19.7* 20.0 - 50.0 % Final  . WBC 06/16/2019 5.4  4.0 - 10.5 K/uL Final  . RBC 06/16/2019 3.75*  4.22 - 5.81 Mil/uL Final  . Platelets 06/16/2019 272.0  150 - 400 K/uL Final  . Hemoglobin 06/16/2019 11.4* 13.0 - 17.0 g/dL Final  . HCT 06/16/2019 34.2* 39 - 52 % Final  . MCV 06/16/2019 91.4  78.0 - 100.0 fl Final  . MCHC 06/16/2019 33.3  30.0 - 36.0 g/dL Final  . RDW 06/16/2019 13.5  11.5 - 15.5 % Final  . Free T4 06/16/2019 1.10  0.60 - 1.60 ng/dL Final   Comment: Specimens from patients who are undergoing biotin therapy and /or ingesting biotin supplements may contain high levels of biotin.  The higher biotin concentration in these specimens interferes with this Free T4 assay.  Specimens that contain high levels  of biotin may cause false high results for this Free T4 assay.  Please interpret results in light of the total clinical presentation of the patient.    [name] Kitchen TSH 06/16/2019 1.88  0.35 - 4.50 uIU/mL Final  . Cholesterol 06/16/2019 150  0 - 200 mg/dL Final   ATP III Classification       Desirable:  < 200 mg/dL               Borderline High:  200 -  239 mg/dL          High:  > = 240 mg/dL  . Triglycerides 06/16/2019 73.0  0 - 149 mg/dL Final   Normal:  <150 mg/dLBorderline High:  150 - 199 mg/dL  . HDL 06/16/2019 50.70  >39.00 mg/dL Final  . VLDL 06/16/2019 14.6  0.0 - 40.0 mg/dL Final  . LDL Cholesterol 06/16/2019 85  0 - 99 mg/dL Final  . Total CHOL/HDL Ratio 06/16/2019 3   Final                  Men          Women1/2 Average Risk     3.4          3.3Average Risk          5.0          4.42X Average Risk          9.6          7.13X Average Risk          15.0          11.0                      . NonHDL 06/16/2019 99.37   Final   NOTE:  Non-HDL goal should be 30 mg/dL higher than patient's LDL goal (i.e. LDL goal of < 70 mg/dL, would have non-HDL goal of < 100 mg/dL)  . Sodium 06/16/2019 135  135 - 145 mEq/L Final  . Potassium 06/16/2019 4.8  3.5 - 5.1 mEq/L Final  . Chloride 06/16/2019 103  96 - 112 mEq/L Final  . CO2 06/16/2019 27  19 - 32 mEq/L Final  . Glucose, Bld 06/16/2019  112* 70 - 99 mg/dL Final  . BUN 06/16/2019 23  6 - 23 mg/dL Final  . Creatinine, Ser 06/16/2019 1.30  0.40 - 1.50 mg/dL Final  . Total Bilirubin 06/16/2019 0.4  0.2 - 1.2 mg/dL Final  . Alkaline Phosphatase 06/16/2019 63  39 - 117 U/L Final  . AST 06/16/2019 32  0 - 37 U/L Final  . ALT 06/16/2019 31  0 - 53 U/L Final  . Total Protein 06/16/2019 7.2  6.0 - 8.3 g/dL Final  . Albumin 06/16/2019 4.2  3.5 - 5.2 g/dL Final  . GFR 06/16/2019 52.98* >60.00 mL/min Final  . Calcium 06/16/2019 9.5  8.4 - 10.5 mg/dL Final  . Hgb A1c MFr Bld 06/16/2019 6.5  4.6 - 6.5 % Final   Glycemic Control Guidelines for People with Diabetes:Non Diabetic:  <6%Goal of Therapy: <7%Additional Action Suggested:  >8%   . VITD 06/16/2019 89.04  30.00 - 100.00 ng/mL Final  . Vitamin B-12 06/16/2019 374  211 - 911 pg/mL Final  . Ferritin 06/16/2019 20.6* 22.0 - 322.0 ng/mL Final  . Microalb, Ur 06/16/2019 <0.7  0.0 - 1.9 mg/dL Final  . Creatinine,U 06/16/2019 97.2  mg/dL Final  . Microalb Creat Ratio 06/16/2019 0.7  0.0 - 30.0 mg/g Final    Allergies as of 06/20/2019   No Known Allergies     Medication List       Accurate as of June 20, 2019  1:05 PM. If you have any questions, ask your nurse or doctor.        Accu-Chek Aviva Plus w/Device Kit Use to check blood sugars 2 times daily, Dx Code E11.65   Accu-Chek FastClix Lancets Misc Use to check blood sugar 2 times per day dx code  E11.65   acetaminophen 325 MG tablet Commonly known as: TYLENOL Take 325 mg by mouth every 6 (six) hours as needed. For pain   albuterol 108 (90 Base) MCG/ACT inhaler Commonly known as: VENTOLIN HFA Inhale 2 puffs into the lungs every 6 (six) hours as needed. For shortness of breath   amLODipine 5 MG tablet Commonly known as: NORVASC TAKE 1 TABLET BY MOUTH EVERY DAY   cholecalciferol 25 MCG (1000 UNIT) tablet Commonly known as: VITAMIN D3 Take 1,000 Units by mouth daily.   clobetasol 0.05 % Gel Commonly known as:  TEMOVATE Apply 1 application topically daily as needed. For face.   ferrous sulfate 325 (65 FE) MG EC tablet Take 325 mg by mouth daily with breakfast.   fexofenadine 180 MG tablet Commonly known as: ALLEGRA Take 180 mg by mouth daily as needed. For allergies   glipiZIDE 5 MG 24 hr tablet Commonly known as: GLUCOTROL XL TAKE 1 TABLET BY MOUTH EVERY DAY   hydroxypropyl methylcellulose / hypromellose 2.5 % ophthalmic solution Commonly known as: ISOPTO TEARS / GONIOVISC Place 1 drop into both eyes at bedtime.   Janumet 50-1000 MG tablet Generic drug: sitaGLIPtin-metformin TAKE 1 TABLET BY MOUTH TWICE A DAY WITH MEALS   levothyroxine 75 MCG tablet Commonly known as: SYNTHROID TAKE 1 TABLET BY MOUTH EVERY DAY BEFORE BREAKFAST   multivitamin capsule Take 1 capsule by mouth daily.   pioglitazone 30 MG tablet Commonly known as: ACTOS TAKE 1 TABLET BY MOUTH EVERY DAY   rosuvastatin 5 MG tablet Commonly known as: CRESTOR TAKE 1 TABLET BY MOUTH EVERY DAY   vitamin B-12 100 MCG tablet Commonly known as: CYANOCOBALAMIN Take 100 mcg by mouth daily.   vitamin C 100 MG tablet Take 100 mg by mouth daily.       Allergies: No Known Allergies  Past Medical History:  Diagnosis Date  . Asthma   . Diabetes mellitus   . Diverticular disease   . Hypertension   . Subdural hematoma [facility]) May 2013   bilateral     Past Surgical History:  Procedure Laterality Date  . BURR HOLE  05/22/2011   Procedure: Haskell Flirt;  Surgeon: Elaina Hoops, MD;  Location: [place] [facility]UR[facility] ORS;  Service: Neurosurgery;  Laterality: Right;  Right Burr Holes for evacuation of subdural hematoma  . BURR HOLE  05/23/2011   Procedure: Haskell Flirt;  Surgeon: Elaina Hoops, MD;  Location: [place] [facility]UR[facility] ORS;  Service: Neurosurgery;  Laterality: Left;  Left [facility]  . CRANIOTOMY  06/14/2011   Procedure: CRANIOTOMY HEMATOMA EVACUATION SUBDURAL;  Surgeon: Elaina Hoops, MD;  Location: [place] NEURO ORS;  Service: Neurosurgery;  Laterality:  N/A;  Left Craniotomy for subdural hematoma  . CRANIOTOMY  06/23/2011   Procedure: CRANIOTOMY HEMATOMA EVACUATION SUBDURAL;  Surgeon: Elaina Hoops, MD;  Location: [place] NEURO ORS;  Service: Neurosurgery;  Laterality: Left;  Redo Craniotomy for Subdural Hematoma  . HERNIA REPAIR    . INGUINAL HERNIA REPAIR Left 09/27/2016   Procedure: LAPAROSCOPIC LEFT INGUINAL HERNIA REPAIR;  Surgeon: Clovis Riley, MD;  Location: [facility] ORS;  Service: General;  Laterality: Left;  . INSERTION OF MESH Left 09/27/2016   Procedure: INSERTION OF MESH;  Surgeon: Clovis Riley, MD;  Location: [facility] ORS;  Service: General;  Laterality: Left;    Family History  Problem Relation Age of Onset  . Diabetes Mother   . Diabetes Father   . Heart attack Father   . Diabetes Sister   .  Diabetes Brother   . Heart attack Brother     Social History:  reports that he quit smoking about 44 years ago. He has never used smokeless tobacco. He reports that he does not drink alcohol and does not use drugs.  Review of Systems:  The patient is asking for detailed review of his labs and explanation of abnormal values and recommendations  On Vitamin D OTC and not clear what the dose is However his level is upper normal at 89  HYPERTENSION:  he has had hypertension for several years   Currently on AMLODIPINE 5 mg daily alone  Previously on 50 mg losartan with good control but this was because of creatinine of 1.6 in 12/18  He does monitor blood pressure at home, has been consistently normal  BP Readings from Last 3 Encounters:  06/20/19 120/62  09/05/18 124/62  01/04/18 122/70      RENAL dysfunction: His creatinine is improved with switching from losartan to amlodipine although still upper normal No microalbuminuria   Lab Results  Component Value Date   CREATININE 1.30 06/16/2019   CREATININE 1.34 09/03/2018   CREATININE 1.39 12/31/2017    HYPERLIPIDEMIA: The lipid abnormality consists of elevated LDL, well controlled  with 5 mg Crestor for several years LDL is below 100 consistently and slightly better now   Lab Results  Component Value Date   CHOL 150 06/16/2019   HDL 50.70 06/16/2019   LDLCALC 85 06/16/2019   TRIG 73.0 06/16/2019   CHOLHDL 3 06/16/2019    HYPOTHYROIDISM: he has had long-standing mild hypothyroidism since 1995  since then has been on 75 mcg dosage consistently He feels fairly good  Continues to be taking 6-1/2 tablets a week with stable thyroid levels  Thyroid levels as follows:  Lab Results  Component Value Date   TSH 1.88 06/16/2019   TSH 2.26 09/03/2018   TSH 1.56 12/31/2017   FREET4 1.10 06/16/2019   FREET4 1.15 09/03/2018   FREET4 1.04 12/31/2017    NEUROPATHY: None No symptoms of numbness in his feet, rarely may have a little tingling.  Also he feels a little sense of what he calls heaviness in his toes with movement . Diabetic foot exam done in 6/21  ANEMIA: Patient had requested evaluation of his labs for anemia  He has had persistent mild anemia which is multifactorial and has been given  Fusion iron by PCP as before  Ferritin level still low although slightly better and iron saturation is just below normal Last colonoscopy 2017  Has gone back to taking his B12 supplement for pernicious anemia  His hemoglobin is slightly lower than before but he has not discussed with PCP   CBC Latest Ref Rng & Units 06/16/2019 09/03/2018 12/31/2017  WBC 4.0 - 10.5 K/uL 5.4 5.1 5.6  Hemoglobin 13.0 - 17.0 g/dL 11.4(L) 11.8(L) 12.5(L)  Hematocrit 39 - 52 % 34.2(L) 35.9(L) 37.4(L)  Platelets 150 - 400 K/uL 272.0 211.0 208.0    Lab Results  Component Value Date   VITAMINB12 374 06/16/2019       Examination:   BP 120/62   Pulse 75   Ht _0  (1.753 m)   Wt 173 lb (78.5 kg)   SpO2 99%   BMI 25.55 kg/m   Body mass index is 25.55 kg/m.    Diabetic Foot Exam - Simple   Simple Foot Form Diabetic Foot exam was performed with the following findings: Yes  06/20/2019  1:19 PM  Visual Inspection No deformities,  no ulcerations, no other skin breakdown bilaterally: Yes Sensation Testing Intact to touch and monofilament testing bilaterally: Yes Pulse Check Posterior Tibialis and Dorsalis pulse intact bilaterally: Yes Comments      ASSESSMENT/ PLAN:   Diabetes type 2 with recent BMI 26  See history of present illness for discussion of current diabetes management, blood sugar patterns and problems identified  He is on a stable 4 drug regimen of glipizide, Actos and Janumet with A1c of 6.5  He has kept his weight down and also has been motivated to walk fairly regularly  Blood sugars are excellent at home although likely checking only fasting readings and not much after meals Does not bring his meter for download consistently  He will stay on the same regimen Again reminded him to check blood sugar readings after meals  All his lab results were discussed in detail  HYPERTENSION: Blood pressure is consistently well controlled with amlodipine and no change in renal function Microalbumin normal again  Hypothyroidism: TSH is consistently normal and he will stay with Synthroid 75, 6-1/2 tablets a week    LIPIDS: Good control of LDL and HDL, to continue rosuvastatin 5 mg  Vitamin D status: His vitamin D level is upper normal and not clear if he needs to be on any supplements, will reduce her supplement to at least twice a week only  Anemia: He will discuss with PCP, not improving with iron  Likely needs at least stool Hemoccult from his PCP as part of the evaluation for iron deficiency     There are no Patient Instructions on file for this visit.   Elayne Snare 06/20/2019, 1:05 PM

## 2019-08-06 ENCOUNTER — Other Ambulatory Visit: Payer: Self-pay | Admitting: Endocrinology

## 2019-08-07 ENCOUNTER — Other Ambulatory Visit: Payer: Self-pay | Admitting: Endocrinology

## 2019-08-29 ENCOUNTER — Other Ambulatory Visit: Payer: Self-pay | Admitting: Endocrinology

## 2019-09-05 ENCOUNTER — Telehealth: Payer: Self-pay

## 2019-09-05 NOTE — Telephone Encounter (Signed)
Dr. Humphrey Rolls called requesting that you give him a call about seeing you. He stated that he hasn't seen you in about 4 years and is wanting to schedule an appointment but wants to talk to you before he does that. [phone]

## 2019-09-05 NOTE — Telephone Encounter (Signed)
The patient is a 79 year old male who presents for a follow-up for Hypertension. Patient is a fairly [place] male with history of diabetes mellitus, controlled, hypertension and hyperlipidemia presents here for his annual visit. He was evaluated for subdural hematoma in May 2013 and  had craniotomy in May 2013, recurrence, craniotomy 11 June 2011. Etiology for subdural hematoma could not be found.  Past medical history significant for hypertension, hyperlipidemia, controlled diabetes mellitus without complications.  I had last seen him on 10/20/2015 and no tests were ordered as she was asymptomatic.  EKG 10/20/2015: Normal sinus rhythm at rate of 72 bpm, normal axis.  No evidence of ischemia, normal EKG.  Echo 1.23.12 Normal echocardiogram.  Stress EKG 02/09/10: Normal Stress ECG. 7.3 METS. Asymptomatic.  AAA screening: 02/07/10: No AAA. normal aorta. Mild plaque iliac arteries.  I have reviewed his past records and have set up an OV with me for review of his medical issues.   Adrian Prows, MD, [facility] 09/05/2019, 5:25 PM Office: [phone]

## 2019-09-09 NOTE — Telephone Encounter (Signed)
Scheduled 10/03/2019

## 2019-09-13 ENCOUNTER — Other Ambulatory Visit: Payer: Self-pay | Admitting: Endocrinology

## 2019-09-25 DIAGNOSIS — Z23 Encounter for immunization: Secondary | ICD-10-CM | POA: Diagnosis not present

## 2019-10-03 ENCOUNTER — Encounter: Payer: Self-pay | Admitting: Cardiology

## 2019-10-03 ENCOUNTER — Ambulatory Visit: Payer: Medicare PPO | Admitting: Cardiology

## 2019-10-03 ENCOUNTER — Other Ambulatory Visit: Payer: Self-pay

## 2019-10-03 VITALS — BP 119/74 | HR 81 | Ht 69.0 in | Wt 176.0 lb

## 2019-10-03 DIAGNOSIS — I10 Essential (primary) hypertension: Secondary | ICD-10-CM | POA: Diagnosis not present

## 2019-10-03 DIAGNOSIS — R0602 Shortness of breath: Secondary | ICD-10-CM | POA: Diagnosis not present

## 2019-10-03 DIAGNOSIS — R0989 Other specified symptoms and signs involving the circulatory and respiratory systems: Secondary | ICD-10-CM

## 2019-10-03 DIAGNOSIS — E119 Type 2 diabetes mellitus without complications: Secondary | ICD-10-CM

## 2019-10-03 DIAGNOSIS — E7800 Pure hypercholesterolemia, unspecified: Secondary | ICD-10-CM

## 2019-10-03 NOTE — Progress Notes (Signed)
Primary Physician/Referring:  Lujean Amel, MD  Patient ID: James Juarez, male    DOB: 1940-08-23, 79 y.o.   MRN: [id number]  Chief Complaint  Patient presents with  . Hypertension    pt c/o of sob in the morning   . Shortness of Breath    Reestablish   HPI:    James Juarez  is a 79 y.o. Asian [place] male with history of diabetes mellitus, controlled, hypertension and hyperlipidemia. He was evaluated for subdural hematoma in May 2013, etiology could not be found.   Patient has not been seen in our office in 4 years.  He presents for routine follow-up for hypertension, hyperlipidemia, and with complaint of occasional shortness of breath.  He reports episodes of shortness of breath in the morning shortly after he wakes up while he is shaving and showering.  He does not get short of breath with other regular activities, this is also not a daily occurrence.  Denies chest pain, palpitations, PND, orthopnea, swelling, dizziness, syncope.    Past Medical History:  Diagnosis Date  . Asthma   . Diabetes mellitus   . Diverticular disease   . Hypertension   . Subdural hematoma [facility]) May 2013   bilateral    Past Surgical History:  Procedure Laterality Date  . BURR HOLE  05/22/2011   Procedure: Haskell Flirt;  Surgeon: Elaina Hoops, MD;  Location: [place] [facility] ORS;  Service: Neurosurgery;  Laterality: Right;  Right Burr Holes for evacuation of subdural hematoma  . BURR HOLE  05/23/2011   Procedure: Haskell Flirt;  Surgeon: Elaina Hoops, MD;  Location: [place] [facility]UR[facility] ORS;  Service: Neurosurgery;  Laterality: Left;  Left [facility]  . CRANIOTOMY  06/14/2011   Procedure: CRANIOTOMY HEMATOMA EVACUATION SUBDURAL;  Surgeon: Elaina Hoops, MD;  Location: [place] [facility] ORS;  Service: Neurosurgery;  Laterality: N/A;  Left Craniotomy for subdural hematoma  . CRANIOTOMY  06/23/2011   Procedure: CRANIOTOMY HEMATOMA EVACUATION SUBDURAL;  Surgeon: Elaina Hoops, MD;  Location: [place] NEURO ORS;  Service: Neurosurgery;  Laterality: Left;   Redo Craniotomy for Subdural Hematoma  . HERNIA REPAIR    . INGUINAL HERNIA REPAIR Left 09/27/2016   Procedure: LAPAROSCOPIC LEFT INGUINAL HERNIA REPAIR;  Surgeon: Clovis Riley, MD;  Location: [facility];  Service: General;  Laterality: Left;  . INSERTION OF MESH Left 09/27/2016   Procedure: INSERTION OF MESH;  Surgeon: Clovis Riley, MD;  Location: [facility];  Service: General;  Laterality: Left;   Family History  Problem Relation Age of Onset  . Diabetes Mother   . Diabetes Father   . Heart attack Father   . Diabetes Sister   . Diabetes Brother   . Heart attack Brother     Social History   Tobacco Use  . Smoking status: Former Smoker    Quit date: 1977    Years since quitting: 44.7  . Smokeless tobacco: Never Used  Substance Use Topics  . Alcohol use: No   Marital Status: Married  ROS  Review of Systems  Constitutional: Negative for malaise/fatigue.  Cardiovascular: Negative for chest pain, claudication, dyspnea on exertion, leg swelling, orthopnea, palpitations, paroxysmal nocturnal dyspnea and syncope.  Respiratory: Positive for shortness of breath.   Hematologic/Lymphatic: Does not bruise/bleed easily.  Gastrointestinal: Negative for melena.  Neurological: Negative for dizziness.   Objective  Blood pressure 119/74, pulse 81, height 5' 9" (1.753 m), weight 176 lb (79.8 kg), SpO2 97 %.  Vitals with BMI 10/03/2019  06/20/2019 09/05/2018  Height 5' 9" 5' 9" 5' 9"  Weight 176 lbs 173 lbs 176 lbs  BMI 25.98 00.17 49.44  Systolic 967 591 638  Diastolic 74 62 62  Pulse 81 75 81     Physical Exam Vitals reviewed.  Constitutional:      Appearance: Normal appearance.  HENT:     Head: Normocephalic and atraumatic.  Cardiovascular:     Rate and Rhythm: Normal rate and regular rhythm.     Pulses: Intact distal pulses.          Carotid pulses are on the right side with bruit.    Heart sounds: S1 normal and S2 normal. Murmur heard.  Systolic murmur is present with a grade of  2/6. Systolic ejection murmur   No gallop.   Pulmonary:     Effort: Pulmonary effort is normal. No respiratory distress.     Breath sounds: No wheezing, rhonchi or rales.  Musculoskeletal:     Right lower leg: No edema.     Left lower leg: No edema.  Neurological:     Mental Status: He is alert.    Laboratory examination:   Recent Labs    06/16/19 0918  NA 135  K 4.8  CL 103  CO2 27  GLUCOSE 112*  BUN 23  CREATININE 1.30  CALCIUM 9.5   CrCl cannot be calculated (Patient's most recent lab result is older than the maximum 21 days allowed.).  CMP Latest Ref Rng & Units 06/16/2019 09/03/2018 12/31/2017  Glucose 70 - 99 mg/dL 112(H) 142(H) 128(H)  BUN 6 - 23 mg/dL _0 Creatinine 0.40 - 1.50 mg/dL 1.30 1.34 1.39  Sodium 135 - 145 mEq/L 135 137 138  Potassium 3.5 - 5.1 mEq/L 4.8 4.4 4.1  Chloride 96 - 112 mEq/L 103 104 103  CO2 19 - 32 mEq/L _1 Calcium 8.4 - 10.5 mg/dL 9.5 9.3 9.4  Total Protein 6.0 - 8.3 g/dL 7.2 6.9 7.0  Total Bilirubin 0.2 - 1.2 mg/dL 0.4 0.5 0.5  Alkaline Phos 39 - 117 U/L 63 55 51  AST 0 - 37 U/L 32 26 24  ALT 0 - 53 U/L _2 CBC Latest Ref Rng & Units 06/16/2019 09/03/2018 12/31/2017  WBC 4.0 - 10.5 K/uL 5.4 5.1 5.6  Hemoglobin 13.0 - 17.0 g/dL 11.4(L) 11.8(L) 12.5(L)  Hematocrit 39 - 52 % 34.2(L) 35.9(L) 37.4(L)  Platelets 150 - 400 K/uL 272.0 211.0 208.0   Lipid Panel Recent Labs    06/16/19 0918  CHOL 150  TRIG 73.0  LDLCALC 85  VLDL 14.6  HDL 50.70  CHOLHDL 3    HEMOGLOBIN A1C Lab Results  Component Value Date   HGBA1C 6.5 06/16/2019   MPG 122.63 09/13/2016   TSH Recent Labs    06/16/19 0918  TSH 1.88   Medications and allergies  No Known Allergies   Outpatient Medications Prior to Visit  Medication Sig Dispense Refill  . ACCU-CHEK FASTCLIX LANCETS MISC Use to check blood sugar 2 times per day dx code E11.65 102 each 3  . acetaminophen (TYLENOL) 325 MG tablet Take 325 mg by mouth every 6 (six) hours as  needed. For pain    . albuterol (PROVENTIL HFA;VENTOLIN HFA) 108 (90 BASE) MCG/ACT inhaler Inhale 2 puffs into the lungs every 6 (six) hours as needed. For shortness of breath    . amLODipine (NORVASC) 5 MG tablet TAKE 1 TABLET BY MOUTH EVERY DAY 90 tablet  1  . Ascorbic Acid (VITAMIN C) 100 MG tablet Take 100 mg by mouth daily.    . Blood Glucose Monitoring Suppl (ACCU-CHEK AVIVA PLUS) w/Device KIT Use to check blood sugars 2 times daily, Dx Code E11.65 1 kit 1  . cholecalciferol (VITAMIN D3) 25 MCG (1000 UNIT) tablet Take 1,000 Units by mouth daily.    . clobetasol (TEMOVATE) 0.05 % GEL Apply 1 application topically daily as needed. For face.    . ferrous sulfate 325 (65 FE) MG EC tablet Take 325 mg by mouth daily with breakfast.     . fexofenadine (ALLEGRA) 180 MG tablet Take 180 mg by mouth daily as needed. For allergies    . glipiZIDE (GLUCOTROL XL) 5 MG 24 hr tablet TAKE 1 TABLET BY MOUTH EVERY DAY 90 tablet 1  . hydroxypropyl methylcellulose (ISOPTO TEARS) 2.5 % ophthalmic solution Place 1 drop into both eyes at bedtime.     [name] Kitchen JANUMET 50-1000 MG tablet TAKE 1 TABLET BY MOUTH TWICE A DAY WITH MEALS 180 tablet 1  . levothyroxine (SYNTHROID) 75 MCG tablet TAKE 1 TABLET BY MOUTH EVERY DAY BEFORE BREAKFAST 90 tablet 1  . Multiple Vitamin (MULTIVITAMIN) capsule Take 1 capsule by mouth daily.    . pioglitazone (ACTOS) 30 MG tablet TAKE 1 TABLET BY MOUTH EVERY DAY 90 tablet 1  . rosuvastatin (CRESTOR) 5 MG tablet TAKE 1 TABLET BY MOUTH EVERY DAY 90 tablet 0  . vitamin B-12 (CYANOCOBALAMIN) 100 MCG tablet Take 100 mcg by mouth daily.    . dorzolamide-timolol (COSOPT) 22.3-6.8 MG/ML ophthalmic solution     . latanoprost (XALATAN) 0.005 % ophthalmic solution SMARTSIG:1 Drop(s) In Eye(s) Every Evening     No facility-administered medications prior to visit.   Radiology:   No results found.  Cardiac Studies:   Stress EKG 02/09/10: Normal Stress ECG. 7.3 METS. Asymptomatic.   Echo 1.23.12 Normal  echocardiogram.   AAA screening: 02/07/10: No AAA. normal aorta. Mild plaque iliac arteries   EKG  EKG 10/03/2019: Normal sinus rhythm with rate of 76 bpm, normal axis, no evidence of ischemia, normal EKG.     Assessment     ICD-10-CM   1. Shortness of breath  R06.02 EKG 12-Lead    PCV ECHOCARDIOGRAM COMPLETE    Brain natriuretic peptide    DG Chest 2 View    PCV MYOCARDIAL PERFUSION WO LEXISCAN  2. Pure hypercholesterolemia  E78.00 EKG 12-Lead  3. Type 2 diabetes mellitus without complication, without long-term current use of insulin (HCC)  E11.9 PCV ECHOCARDIOGRAM COMPLETE    Brain natriuretic peptide    PCV MYOCARDIAL PERFUSION WO LEXISCAN  4. Hypercholesteremia  E78.00   5. Primary hypertension  I10   6. Bruit of right carotid artery  R09.89 PCV CAROTID DUPLEX (BILATERAL)     There are no discontinued medications.  No orders of the defined types were placed in this encounter.   Recommendations:   Cong A Punch is a 79 y.o. [place] male with history of diabetes mellitus, controlled, hypertension and hyperlipidemia. He was evaluated for subdural hematoma in May 2013, etiology could not be found.  Reviewed external labs, lipids are well controlled and diabetes is well controlled with recent A1c of 6.5% on 06/16/2019.  His blood pressure is also well controlled today.  Patient's hyperlipidemia, hypertension, diabetes mellitus are managed on a regular basis by his primary care provider.  Recommend continuing amlodipine 5 mg daily and Crestor 5 mg daily.  In regard to patient's shortness of  breath, in view of cardiovascular risk factors, including diabetes mellitus, as well as the fact that he has not had cardiac work-up since 2012 will obtain echocardiogram and nuclear stress test to evaluate structure and function of the heart as well as for potential coronary artery disease.  Will also obtain BNP.  Will obtain chest x-ray to further evaluate patient's symptoms of shortness  of breath and possible pulmonary etiology.  On physical exam patient noted to have soft carotid bruit on the right side, will obtain bilateral carotid duplex to evaluate.  Follow-up in 4 weeks for dyspnea and to discuss results of cardiac testing.  Patient was seen in collaboration with Dr. Einar Gip. He also reviewed patient's chart and examined the patient. Dr. Einar Gip is in agreement of the plan.    Alethia Berthold, PA-C 10/04/2019, 12:28 PM Office: [phone]

## 2019-10-04 ENCOUNTER — Encounter: Payer: Self-pay | Admitting: Cardiology

## 2019-10-05 LAB — BRAIN NATRIURETIC PEPTIDE: BNP: 41 pg/mL (ref 0.0–100.0)

## 2019-10-06 ENCOUNTER — Ambulatory Visit
Admission: RE | Admit: 2019-10-06 | Discharge: 2019-10-06 | Disposition: A | Discharge status: Home / Self Care | Payer: Medicare PPO | Source: Ambulatory Visit | Attending: Student | Admitting: Student

## 2019-10-06 ENCOUNTER — Other Ambulatory Visit: Payer: Self-pay

## 2019-10-06 DIAGNOSIS — R0600 Dyspnea, unspecified: Secondary | ICD-10-CM | POA: Diagnosis not present

## 2019-10-08 NOTE — Progress Notes (Signed)
Called patient, NA, LMAM

## 2019-10-08 NOTE — Progress Notes (Signed)
Please inform pt that his chest x-ray was normal. No lung or heart abnormalities were seen.

## 2019-10-09 ENCOUNTER — Ambulatory Visit: Payer: Medicare PPO

## 2019-10-09 ENCOUNTER — Other Ambulatory Visit: Payer: Self-pay

## 2019-10-09 DIAGNOSIS — E119 Type 2 diabetes mellitus without complications: Secondary | ICD-10-CM

## 2019-10-09 DIAGNOSIS — R0602 Shortness of breath: Secondary | ICD-10-CM

## 2019-10-09 DIAGNOSIS — R0989 Other specified symptoms and signs involving the circulatory and respiratory systems: Secondary | ICD-10-CM

## 2019-10-13 ENCOUNTER — Telehealth: Payer: Self-pay

## 2019-10-13 NOTE — Progress Notes (Signed)
Please inform patient BNP (measure of heart function) and carotid duplex are all normal. Echo showed mild heart muscle thickening, heart function is normal. Overall no significant abnormalities on echo.

## 2019-10-13 NOTE — Telephone Encounter (Signed)
-----   Message from Alethia Berthold, [place] sent at 10/13/2019  8:42 AM EDT ----- Please inform patient BNP (measure of heart function) and carotid duplex are all normal. Echo showed mild heart muscle thickening, heart function is normal. Overall no significant abnormalities on echo.

## 2019-10-13 NOTE — Telephone Encounter (Signed)
Relayed information to patient in regards to BNP, Echo, and Duplex results. Patient voiced understanding.

## 2019-10-14 DIAGNOSIS — H401211 Low-tension glaucoma, right eye, mild stage: Secondary | ICD-10-CM | POA: Diagnosis not present

## 2019-10-14 DIAGNOSIS — H40032 Anatomical narrow angle, left eye: Secondary | ICD-10-CM | POA: Diagnosis not present

## 2019-10-14 DIAGNOSIS — H40012 Open angle with borderline findings, low risk, left eye: Secondary | ICD-10-CM | POA: Diagnosis not present

## 2019-10-15 ENCOUNTER — Other Ambulatory Visit: Payer: Self-pay | Admitting: Cardiology

## 2019-10-15 ENCOUNTER — Ambulatory Visit: Payer: Medicare PPO

## 2019-10-15 ENCOUNTER — Other Ambulatory Visit: Payer: Self-pay

## 2019-10-15 DIAGNOSIS — R0602 Shortness of breath: Secondary | ICD-10-CM

## 2019-10-15 DIAGNOSIS — E119 Type 2 diabetes mellitus without complications: Secondary | ICD-10-CM

## 2019-10-15 NOTE — Addendum Note (Signed)
Addended by: Kela Millin on: 10/15/2019 10:58 PM   Modules accepted: Orders

## 2019-11-06 ENCOUNTER — Other Ambulatory Visit: Payer: Self-pay

## 2019-11-06 ENCOUNTER — Ambulatory Visit: Payer: Medicare PPO | Admitting: Cardiology

## 2019-11-06 ENCOUNTER — Encounter: Payer: Self-pay | Admitting: Cardiology

## 2019-11-06 VITALS — BP 133/74 | HR 78 | Resp 16 | Ht 69.0 in | Wt 176.0 lb

## 2019-11-06 DIAGNOSIS — R0602 Shortness of breath: Secondary | ICD-10-CM | POA: Diagnosis not present

## 2019-11-06 DIAGNOSIS — I10 Essential (primary) hypertension: Secondary | ICD-10-CM

## 2019-11-06 DIAGNOSIS — E7800 Pure hypercholesterolemia, unspecified: Secondary | ICD-10-CM | POA: Diagnosis not present

## 2019-11-06 DIAGNOSIS — R0989 Other specified symptoms and signs involving the circulatory and respiratory systems: Secondary | ICD-10-CM | POA: Diagnosis not present

## 2019-11-06 NOTE — Progress Notes (Signed)
Primary Physician/Referring:  Lujean Amel, MD  Patient ID: James Juarez, male    DOB: [birth year], 79 y.o.   MRN: [id number]  Chief Complaint  Patient presents with  . Follow-up    4 week  . Shortness of Breath  . Results   HPI:    James Juarez  is a 79 y.o. [place] male with history of diabetes mellitus, controlled, hypertension and hyperlipidemia. He was evaluated for subdural hematoma in May 2013, etiology could not be found.   He presents for routine follow-up for hypertension, hyperlipidemia, and with complaint of occasional shortness of breath. Denies chest pain, palpitations, PND, orthopnea, swelling, dizziness, syncope.  He underwent treadmill stress test and echocardiogram and carotid duplex and presents for follow-up, his wife is present at the bedside.  No new symptoms.  He continues to walk at least 30 minutes on a daily basis.  Past Medical History:  Diagnosis Date  . Asthma   . Diabetes mellitus   . Diverticular disease   . Hypertension   . Subdural hematoma [facility]) May 2013   bilateral    Past Surgical History:  Procedure Laterality Date  . BURR HOLE  05/22/2011   Procedure: Haskell Flirt;  Surgeon: Elaina Hoops, MD;  Location: [place] NEUR[facility] ORS;  Service: Neurosurgery;  Laterality: Right;  Right Burr Holes for evacuation of subdural hematoma  . BURR HOLE  05/23/2011   Procedure: Haskell Flirt;  Surgeon: Elaina Hoops, MD;  Location: [place] [facility]S;  Service: Neurosurgery;  Laterality: Left;  Left [facility]  . CRANIOTOMY  06/14/2011   Procedure: CRANIOTOMY HEMATOMA EVACUATION SUBDURAL;  Surgeon: Elaina Hoops, MD;  Location: [place] NEURO ORS;  Service: Neurosurgery;  Laterality: N/A;  Left Craniotomy for subdural hematoma  . CRANIOTOMY  06/23/2011   Procedure: CRANIOTOMY HEMATOMA EVACUATION SUBDURAL;  Surgeon: Elaina Hoops, MD;  Location: [place] NEUR[facility] ORS;  Service: Neurosurgery;  Laterality: Left;  Redo Craniotomy for Subdural Hematoma  . HERNIA REPAIR    . INGUINAL HERNIA REPAIR Left  09/27/2016   Procedure: LAPAROSCOPIC LEFT INGUINAL HERNIA REPAIR;  Surgeon: Clovis Riley, MD;  Location: [facility];  Service: General;  Laterality: Left;  . INSERTION OF MESH Left 09/27/2016   Procedure: INSERTION OF MESH;  Surgeon: Clovis Riley, MD;  Location: [facility];  Service: General;  Laterality: Left;   Family History  Problem Relation Age of Onset  . Diabetes Mother   . Diabetes Father   . Heart attack Father   . Diabetes Sister   . Diabetes Brother   . Heart attack Brother     Social History   Tobacco Use  . Smoking status: Former Smoker    Quit date: 1977    Years since quitting: 44.8  . Smokeless tobacco: Never Used  Substance Use Topics  . Alcohol use: No   Marital Status: Married  ROS  Review of Systems  Constitutional: Negative for malaise/fatigue.  Cardiovascular: Negative for chest pain, claudication, dyspnea on exertion, leg swelling, orthopnea, palpitations, paroxysmal nocturnal dyspnea and syncope.  Respiratory: Positive for shortness of breath.   Hematologic/Lymphatic: Does not bruise/bleed easily.  Gastrointestinal: Negative for melena.  Neurological: Negative for dizziness.   Objective  Blood pressure 133/74, pulse 78, resp. rate 16, height 5' 9" (1.753 m), weight 176 lb (79.8 kg), SpO2 98 %.  Vitals with BMI 11/06/2019 10/03/2019 06/20/2019  Height 5' 9" 5' 9" 5' 9"  Weight 176 lbs 176 lbs 173 lbs  BMI 25.98  46.65 99.35  Systolic 701 779 390  Diastolic 74 74 62  Pulse 78 81 75     Physical Exam Vitals reviewed.  Constitutional:      Appearance: Normal appearance.  HENT:     Head: Normocephalic and atraumatic.  Cardiovascular:     Rate and Rhythm: Normal rate and regular rhythm.     Pulses: Intact distal pulses.          Carotid pulses are on the right side with bruit.    Heart sounds: S1 normal and S2 normal. Murmur heard.  Systolic murmur is present with a grade of 2/6. Systolic ejection murmur   No gallop.   Pulmonary:     Effort:  Pulmonary effort is normal. No respiratory distress.     Breath sounds: No wheezing, rhonchi or rales.  Musculoskeletal:     Right lower leg: No edema.     Left lower leg: No edema.  Neurological:     Mental Status: He is alert.    Laboratory examination:   Recent Labs    06/16/19 0918  NA 135  K 4.8  CL 103  CO2 27  GLUCOSE 112*  BUN 23  CREATININE 1.30  CALCIUM 9.5   CrCl cannot be calculated (Patient's most recent lab result is older than the maximum 21 days allowed.).  CMP Latest Ref Rng & Units 06/16/2019 09/03/2018 12/31/2017  Glucose 70 - 99 mg/dL 112(H) 142(H) 128(H)  BUN 6 - 23 mg/dL _0 Creatinine 0.40 - 1.50 mg/dL 1.30 1.34 1.39  Sodium 135 - 145 mEq/L 135 137 138  Potassium 3.5 - 5.1 mEq/L 4.8 4.4 4.1  Chloride 96 - 112 mEq/L 103 104 103  CO2 19 - 32 mEq/L _1 Calcium 8.4 - 10.5 mg/dL 9.5 9.3 9.4  Total Protein 6.0 - 8.3 g/dL 7.2 6.9 7.0  Total Bilirubin 0.2 - 1.2 mg/dL 0.4 0.5 0.5  Alkaline Phos 39 - 117 U/L 63 55 51  AST 0 - 37 U/L 32 26 24  ALT 0 - 53 U/L _2 CBC Latest Ref Rng & Units 06/16/2019 09/03/2018 12/31/2017  WBC 4.0 - 10.5 K/uL 5.4 5.1 5.6  Hemoglobin 13.0 - 17.0 g/dL 11.4(L) 11.8(L) 12.5(L)  Hematocrit 39 - 52 % 34.2(L) 35.9(L) 37.4(L)  Platelets 150 - 400 K/uL 272.0 211.0 208.0   Lipid Panel Recent Labs    06/16/19 0918  CHOL 150  TRIG 73.0  LDLCALC 85  VLDL 14.6  HDL 50.70  CHOLHDL 3    HEMOGLOBIN A1C Lab Results  Component Value Date   HGBA1C 6.5 06/16/2019   MPG 122.63 09/13/2016   TSH Recent Labs    06/16/19 0918  TSH 1.88   Medications and allergies  No Known Allergies   Outpatient Medications Prior to Visit  Medication Sig Dispense Refill  . ACCU-CHEK FASTCLIX LANCETS MISC Use to check blood sugar 2 times per day dx code E11.65 102 each 3  . acetaminophen (TYLENOL) 325 MG tablet Take 325 mg by mouth every 6 (six) hours as needed. For pain    . albuterol (PROVENTIL HFA;VENTOLIN HFA) 108 (90 BASE)  MCG/ACT inhaler Inhale 2 puffs into the lungs every 6 (six) hours as needed. For shortness of breath    . amLODipine (NORVASC) 5 MG tablet TAKE 1 TABLET BY MOUTH EVERY DAY 90 tablet 1  . Ascorbic Acid (VITAMIN C) 100 MG tablet Take 100 mg by mouth daily.    . Blood  Glucose Monitoring Suppl (ACCU-CHEK AVIVA PLUS) w/Device KIT Use to check blood sugars 2 times daily, Dx Code E11.65 1 kit 1  . cholecalciferol (VITAMIN D3) 25 MCG (1000 UNIT) tablet Take 1,000 Units by mouth 3 (three) times a week.     . clobetasol (TEMOVATE) 0.05 % GEL Apply 1 application topically daily as needed. For face.    . dorzolamide-timolol (COSOPT) 22.3-6.8 MG/ML ophthalmic solution     . ferrous sulfate 325 (65 FE) MG EC tablet Take 325 mg by mouth daily with breakfast.     . fexofenadine (ALLEGRA) 180 MG tablet Take 180 mg by mouth daily as needed. For allergies    . glipiZIDE (GLUCOTROL XL) 5 MG 24 hr tablet TAKE 1 TABLET BY MOUTH EVERY DAY 90 tablet 1  . hydroxypropyl methylcellulose (ISOPTO TEARS) 2.5 % ophthalmic solution Place 1 drop into both eyes at bedtime.     [name] Kitchen JANUMET 50-1000 MG tablet TAKE 1 TABLET BY MOUTH TWICE A DAY WITH MEALS 180 tablet 1  . latanoprost (XALATAN) 0.005 % ophthalmic solution SMARTSIG:1 Drop(s) In Eye(s) Every Evening    . levothyroxine (SYNTHROID) 75 MCG tablet TAKE 1 TABLET BY MOUTH EVERY DAY BEFORE BREAKFAST 90 tablet 1  . Multiple Vitamin (MULTIVITAMIN) capsule Take 1 capsule by mouth daily.    . pioglitazone (ACTOS) 30 MG tablet TAKE 1 TABLET BY MOUTH EVERY DAY 90 tablet 1  . rosuvastatin (CRESTOR) 5 MG tablet TAKE 1 TABLET BY MOUTH EVERY DAY 90 tablet 0  . vitamin B-12 (CYANOCOBALAMIN) 100 MCG tablet Take 100 mcg by mouth 3 (three) times a week.      No facility-administered medications prior to visit.   Radiology:   No results found.  Cardiac Studies:   AAA screening: 02/07/10: No AAA. normal aorta. Mild plaque iliac arteries.  Treadmill Exercise Stress 10/15/2019: Normal  treadmill exercise stress test. Resting ECG demonstrated normal sinus rhythm. Peak ECG demonstrated no ST-T wave abnormalities. The patient exercised for 6 minutes and 1 seconds on Bruce protocol; achieved 7.05 METs at 108% of maximum predicted heart rate. Chest pain is not present. The heart rate response was accelerated. The blood pressure response was normal. Continue primary/secondary prevention  Echocardiogram 10/09/2019: Normal LV systolic function with visual EF 55-60%. Left ventricle cavity is normal in size. Normal global wall motion. Indeterminate diastolic filling pattern, normal LAP. Mild left ventricular hypertrophy. Calculated EF 56%. Left atrial cavity is mildly dilated. No significant valvular abnormalities. IVC is dilated with a respiratory response of >50%. No prior study for comparison.  Carotid artery duplex  10/09/2019: The bifurcation, internal, external and common carotid arteries reveal no evidence of significant stenosis, bilaterally. Antegrade right vertebral artery flow. Antegrade left vertebral artery flow.  EKG  EKG 10/03/2019: Normal sinus rhythm with rate of 76 bpm, normal axis, no evidence of ischemia, normal EKG.     Assessment     ICD-10-CM   1. Shortness of breath  R06.02   2. Pure hypercholesterolemia  E78.00   3. Hypercholesteremia  E78.00   4. Primary hypertension  I10   5. Bruit of right carotid artery  R09.89      There are no discontinued medications.  No orders of the defined types were placed in this encounter.   Recommendations:   James Juarez is a 79 y.o. James Juarez  is a 79 y.o. [place] male with history of diabetes mellitus, controlled, hypertension and hyperlipidemia. He was evaluated for subdural hematoma in May 2013, etiology could  not be found.   He presents for routine follow-up for hypertension, hyperlipidemia, and with complaint of occasional shortness of breath.  I reviewed the results of the treadmill stress test  and also echocardiogram with the patient and his wife.  We discussed regarding LVH, diastolic dysfunction and dyspnea.  Advised him to continue to do physical activity and to push himself to doing slight uphill walking to improve diastolic compliance.  Reviewed external labs, lipids are well controlled and diabetes is well controlled with recent A1c of 6.5% on 06/16/2019.  His blood pressure is also well controlled today.   I also reviewed his carotid artery duplex.  No significant disease fortunately.  I was going to see him on a as needed basis however patient and his wife both request in view of diabetes mellitus, hypertension hyperlipidemia and aortic atherosclerosis there would prefer to see me at least on annual basis and bring the labs.   Adrian Prows, PA-C 11/06/2019, 4:13 PM Office: [phone]

## 2019-11-11 ENCOUNTER — Other Ambulatory Visit: Payer: Self-pay | Admitting: Endocrinology

## 2019-11-26 ENCOUNTER — Other Ambulatory Visit: Payer: Self-pay | Admitting: Endocrinology

## 2019-12-09 ENCOUNTER — Other Ambulatory Visit: Payer: Self-pay | Admitting: Endocrinology

## 2020-01-19 ENCOUNTER — Other Ambulatory Visit: Payer: Self-pay

## 2020-01-19 ENCOUNTER — Ambulatory Visit: Payer: Medicare PPO | Admitting: Endocrinology

## 2020-01-19 ENCOUNTER — Other Ambulatory Visit (INDEPENDENT_AMBULATORY_CARE_PROVIDER_SITE_OTHER): Payer: Medicare PPO

## 2020-01-19 ENCOUNTER — Other Ambulatory Visit: Payer: Medicare PPO

## 2020-01-19 ENCOUNTER — Other Ambulatory Visit: Payer: Self-pay | Admitting: Endocrinology

## 2020-01-19 DIAGNOSIS — D513 Other dietary vitamin B12 deficiency anemia: Secondary | ICD-10-CM | POA: Diagnosis not present

## 2020-01-19 DIAGNOSIS — E063 Autoimmune thyroiditis: Secondary | ICD-10-CM | POA: Diagnosis not present

## 2020-01-19 DIAGNOSIS — E119 Type 2 diabetes mellitus without complications: Secondary | ICD-10-CM | POA: Diagnosis not present

## 2020-01-19 DIAGNOSIS — D508 Other iron deficiency anemias: Secondary | ICD-10-CM

## 2020-01-19 DIAGNOSIS — E559 Vitamin D deficiency, unspecified: Secondary | ICD-10-CM | POA: Diagnosis not present

## 2020-01-19 DIAGNOSIS — E7800 Pure hypercholesterolemia, unspecified: Secondary | ICD-10-CM

## 2020-01-19 LAB — COMPREHENSIVE METABOLIC PANEL
ALT: 19 U/L (ref 0–53)
AST: 24 U/L (ref 0–37)
Albumin: 4.5 g/dL (ref 3.5–5.2)
Alkaline Phosphatase: 50 U/L (ref 39–117)
BUN: 27 mg/dL — ABNORMAL HIGH (ref 6–23)
CO2: 28 mEq/L (ref 19–32)
Calcium: 9.7 mg/dL (ref 8.4–10.5)
Chloride: 102 mEq/L (ref 96–112)
Creatinine, Ser: 1.23 mg/dL (ref 0.40–1.50)
GFR: 55.02 mL/min — ABNORMAL LOW (ref >60.00)
Glucose, Bld: 139 mg/dL — ABNORMAL HIGH (ref 70–99)
Potassium: 4.3 mEq/L (ref 3.5–5.1)
Sodium: 136 mEq/L (ref 135–145)
Total Bilirubin: 0.6 mg/dL (ref 0.2–1.2)
Total Protein: 7.4 g/dL (ref 6.0–8.3)

## 2020-01-19 LAB — CBC WITH DIFFERENTIAL/PLATELET
Basophils Absolute: 0 10*3/uL (ref 0.0–0.1)
Basophils Relative: 0.2 % (ref 0.0–3.0)
Eosinophils Absolute: 0.2 10*3/uL (ref 0.0–0.7)
Eosinophils Relative: 3.5 % (ref 0.0–5.0)
HCT: 37.7 % — ABNORMAL LOW (ref 39.0–52.0)
Hemoglobin: 12.3 g/dL — ABNORMAL LOW (ref 13.0–17.0)
Lymphocytes Relative: 37.2 % (ref 12.0–46.0)
Lymphs Abs: 2.1 10*3/uL (ref 0.7–4.0)
MCHC: 32.6 g/dL (ref 30.0–36.0)
MCV: 91.1 fl (ref 78.0–100.0)
Monocytes Absolute: 0.6 10*3/uL (ref 0.1–1.0)
Monocytes Relative: 10.3 % (ref 3.0–12.0)
Neutro Abs: 2.7 10*3/uL (ref 1.4–7.7)
Neutrophils Relative %: 48.8 % (ref 43.0–77.0)
Platelets: 221 10*3/uL (ref 150.0–400.0)
RBC: 4.14 Mil/uL — ABNORMAL LOW (ref 4.22–5.81)
RDW: 14.3 % (ref 11.5–15.5)
WBC: 5.6 10*3/uL (ref 4.0–10.5)

## 2020-01-19 LAB — TSH: TSH: 1.57 u[IU]/mL (ref 0.35–4.50)

## 2020-01-19 LAB — VITAMIN B12: Vitamin B-12: 368 pg/mL (ref 211–911)

## 2020-01-19 LAB — HEMOGLOBIN A1C: Hgb A1c MFr Bld: 6.5 % (ref 4.6–6.5)

## 2020-01-19 LAB — VITAMIN D 25 HYDROXY (VIT D DEFICIENCY, FRACTURES): VITD: 97.52 ng/mL (ref 30.00–100.00)

## 2020-01-19 NOTE — Addendum Note (Signed)
Addended by: Jacob Moores on: 01/19/2020 12:28 PM   Modules accepted: Orders

## 2020-01-20 LAB — LIPID PANEL
Cholesterol: 165 mg/dL (ref 0–200)
HDL: 63.1 mg/dL (ref >39.00)
LDL Cholesterol: 85 mg/dL (ref 0–99)
NonHDL: 102.27
Total CHOL/HDL Ratio: 3
Triglycerides: 88 mg/dL (ref 0.0–149.0)
VLDL: 17.6 mg/dL (ref 0.0–40.0)

## 2020-01-20 LAB — MICROALBUMIN / CREATININE URINE RATIO
Creatinine,U: 89.2 mg/dL
Microalb Creat Ratio: 0.8 mg/g (ref 0.0–30.0)
Microalb, Ur: <0.7 mg/dL (ref 0.0–1.9)

## 2020-01-22 ENCOUNTER — Other Ambulatory Visit: Payer: Self-pay

## 2020-01-22 ENCOUNTER — Ambulatory Visit: Payer: Medicare PPO | Admitting: Endocrinology

## 2020-01-22 ENCOUNTER — Encounter: Payer: Self-pay | Admitting: Endocrinology

## 2020-01-22 VITALS — BP 140/78 | HR 74 | Temp 97.7°F | Resp 16 | Ht 71.0 in | Wt 174.0 lb

## 2020-01-22 DIAGNOSIS — E7800 Pure hypercholesterolemia, unspecified: Secondary | ICD-10-CM | POA: Diagnosis not present

## 2020-01-22 DIAGNOSIS — D508 Other iron deficiency anemias: Secondary | ICD-10-CM | POA: Diagnosis not present

## 2020-01-22 DIAGNOSIS — I10 Essential (primary) hypertension: Secondary | ICD-10-CM

## 2020-01-22 DIAGNOSIS — E063 Autoimmune thyroiditis: Secondary | ICD-10-CM

## 2020-01-22 DIAGNOSIS — E119 Type 2 diabetes mellitus without complications: Secondary | ICD-10-CM

## 2020-01-22 DIAGNOSIS — E559 Vitamin D deficiency, unspecified: Secondary | ICD-10-CM | POA: Diagnosis not present

## 2020-01-22 LAB — FERRITIN: Ferritin: 13.5 ng/mL — ABNORMAL LOW (ref 22.0–322.0)

## 2020-01-22 MED ORDER — ACCU-CHEK GUIDE VI STRP: ORAL_STRIP | 12 refills | Status: DC

## 2020-01-22 NOTE — Addendum Note (Signed)
Addended by: Kaylyn Lim I on: 01/22/2020 02:24 PM   Modules accepted: Orders

## 2020-01-22 NOTE — Progress Notes (Signed)
  Patient ID: James Juarez, male   DOB: 05/10/1940, 80 y.o.   MRN: [id number]   Reason for Appointment: follow-up of various problems  History of Present Illness   Diagnosis: Type 2 DIABETES MELITUS  Oral hypoglycemic drugs: Glipizide ER 5 mg, Actos 30 mg, and Janumet 50/1000 twice a day   He has had long-standing diabetes which has been well-controlled with the same 4 drug regimen which has been unchanged for several years  A1c is usually upper normal and is the same at 6.5   He is doing very well with his blood sugar control and most of his blood sugars are near normal  Lab glucose was 139 reportedly fasting and at home it was 111 at a different time likely  He has an old meter and not clear if this is accurate as well as the time is programmed incorrectly  He had a glucose of 69 about bedtime but he did not feel any different  He is following his diet very well and avoiding meats  Trying to walk at least 4 days a week  Weight is down another 2 pounds  No side effects from any of his medications   GLUCOSE monitoring:  Glucometer:  Accu-Chek Aviva  Blood sugars from download as follows  Since the time on his meter is 5 hours fast difficult to get a analysis and not clear when the time was changed  MORNING readings likely ranging from 104 up to 130 BEDTIME readings 69-204 with only 1 high reading   Side effects from medications: None       Diet: Generally low fat and small portions        Physical activity: exercise: Walking 20 min, 4-6/7 days a week              Wt Readings from Last 3 Encounters:  01/22/20 174 lb (78.9 kg)  11/06/19 176 lb (79.8 kg)  10/03/19 176 lb (79.8 kg)     He has several other active problems which are discussed in review of systems   LABS:  Lab Results  Component Value Date   HGBA1C 6.5 01/19/2020   HGBA1C 6.5 06/16/2019   HGBA1C 6.4 09/03/2018   Lab Results  Component Value Date   MICROALBUR <0.7 01/19/2020    LDLCALC 85 01/19/2020   CREATININE 1.23 01/19/2020     Lab on 01/19/2020  Component Date Value Ref Range Status  . TSH 01/19/2020 1.57  0.35 - 4.50 uIU/mL Final  . Cholesterol 01/19/2020 165  0 - 200 mg/dL Final   ATP III Classification       Desirable:  < 200 mg/dL               Borderline High:  200 - 239 mg/dL          High:  > = 240 mg/dL  . Triglycerides 01/19/2020 88.0  0.0 - 149.0 mg/dL Final   Normal:  <150 mg/dLBorderline High:  150 - 199 mg/dL  . HDL 01/19/2020 63.10  >39.00 mg/dL Final  . VLDL 01/19/2020 17.6  0.0 - 40.0 mg/dL Final  . LDL Cholesterol 01/19/2020 85  0 - 99 mg/dL Final  . Total CHOL/HDL Ratio 01/19/2020 3   Final                  Men          Women1/2 Average Risk     3.4            3.3Average Risk          5.0          4.42X Average Risk          9.6          7.13X Average Risk          15.0          11.0                      . NonHDL 01/19/2020 102.27   Final   NOTE:  Non-HDL goal should be 30 mg/dL higher than patient's LDL goal (i.e. LDL goal of < 70 mg/dL, would have non-HDL goal of < 100 mg/dL)  . Sodium 01/19/2020 136  135 - 145 mEq/L Final  . Potassium 01/19/2020 4.3  3.5 - 5.1 mEq/L Final  . Chloride 01/19/2020 102  96 - 112 mEq/L Final  . CO2 01/19/2020 28  19 - 32 mEq/L Final  . Glucose, Bld 01/19/2020 139* 70 - 99 mg/dL Final  . BUN 01/19/2020 27* 6 - 23 mg/dL Final  . Creatinine, Ser 01/19/2020 1.23  0.40 - 1.50 mg/dL Final  . Total Bilirubin 01/19/2020 0.6  0.2 - 1.2 mg/dL Final  . Alkaline Phosphatase 01/19/2020 50  39 - 117 U/L Final  . AST 01/19/2020 24  0 - 37 U/L Final  . ALT 01/19/2020 19  0 - 53 U/L Final  . Total Protein 01/19/2020 7.4  6.0 - 8.3 g/dL Final  . Albumin 01/19/2020 4.5  3.5 - 5.2 g/dL Final  . GFR 01/19/2020 55.02* >60.00 mL/min Final   Calculated using the CKD-EPI Creatinine Equation (2021)  . Calcium 01/19/2020 9.7  8.4 - 10.5 mg/dL Final  . Hgb A1c MFr Bld 01/19/2020 6.5  4.6 - 6.5 % Final   Glycemic Control  Guidelines for People with Diabetes:Non Diabetic:  <6%Goal of Therapy: <7%Additional Action Suggested:  >8%   . Vitamin B-12 01/19/2020 368  211 - 911 pg/mL Final  . VITD 01/19/2020 97.52  30.00 - 100.00 ng/mL Final  . Microalb, Ur 01/19/2020 <0.7  0.0 - 1.9 mg/dL Final  . Creatinine,U 01/19/2020 89.2  mg/dL Final  . Microalb Creat Ratio 01/19/2020 0.8  0.0 - 30.0 mg/g Final  . WBC 01/19/2020 5.6  4.0 - 10.5 K/uL Final  . RBC 01/19/2020 4.14* 4.22 - 5.81 Mil/uL Final  . Hemoglobin 01/19/2020 12.3* 13.0 - 17.0 g/dL Final  . HCT 01/19/2020 37.7* 39.0 - 52.0 % Final  . MCV 01/19/2020 91.1  78.0 - 100.0 fl Final  . MCHC 01/19/2020 32.6  30.0 - 36.0 g/dL Final  . RDW 01/19/2020 14.3  11.5 - 15.5 % Final  . Platelets 01/19/2020 221.0  150.0 - 400.0 K/uL Final  . Neutrophils Relative % 01/19/2020 48.8  43.0 - 77.0 % Final  . Lymphocytes Relative 01/19/2020 37.2  12.0 - 46.0 % Final  . Monocytes Relative 01/19/2020 10.3  3.0 - 12.0 % Final  . Eosinophils Relative 01/19/2020 3.5  0.0 - 5.0 % Final  . Basophils Relative 01/19/2020 0.2  0.0 - 3.0 % Final  . Neutro Abs 01/19/2020 2.7  1.4 - 7.7 K/uL Final  . Lymphs Abs 01/19/2020 2.1  0.7 - 4.0 K/uL Final  . Monocytes Absolute 01/19/2020 0.6  0.1 - 1.0 K/uL Final  . Eosinophils Absolute 01/19/2020 0.2  0.0 - 0.7 K/uL Final  . Basophils Absolute 01/19/2020 0.0  0.0 - 0.1 K/uL Final    Allergies as  of 01/22/2020   No Known Allergies     Medication List       Accurate as of January 22, 2020  1:06 PM. If you have any questions, ask your nurse or doctor.        Accu-Chek Aviva Plus w/Device Kit Use to check blood sugars 2 times daily, Dx Code E11.65   Accu-Chek FastClix Lancets Misc Use to check blood sugar 2 times per day dx code E11.65   acetaminophen 325 MG tablet Commonly known as: TYLENOL Take 325 mg by mouth every 6 (six) hours as needed. For pain   albuterol 108 (90 Base) MCG/ACT inhaler Commonly known as: VENTOLIN HFA Inhale 2  puffs into the lungs every 6 (six) hours as needed. For shortness of breath   amLODipine 5 MG tablet Commonly known as: NORVASC TAKE 1 TABLET BY MOUTH EVERY DAY   cholecalciferol 25 MCG (1000 UNIT) tablet Commonly known as: VITAMIN D3 Take 1,000 Units by mouth 3 (three) times a week.   clobetasol 0.05 % Gel Commonly known as: TEMOVATE Apply 1 application topically daily as needed. For face.   dorzolamide-timolol 22.3-6.8 MG/ML ophthalmic solution Commonly known as: COSOPT   ferrous sulfate 325 (65 FE) MG EC tablet Take 325 mg by mouth daily with breakfast.   fexofenadine 180 MG tablet Commonly known as: ALLEGRA Take 180 mg by mouth daily as needed. For allergies   glipiZIDE 5 MG 24 hr tablet Commonly known as: GLUCOTROL XL TAKE 1 TABLET BY MOUTH EVERY DAY   hydroxypropyl methylcellulose / hypromellose 2.5 % ophthalmic solution Commonly known as: ISOPTO TEARS / GONIOVISC Place 1 drop into both eyes at bedtime.   Janumet 50-1000 MG tablet Generic drug: sitaGLIPtin-metformin TAKE 1 TABLET BY MOUTH TWICE A DAY WITH MEALS   latanoprost 0.005 % ophthalmic solution Commonly known as: XALATAN SMARTSIG:1 Drop(s) In Eye(s) Every Evening   levothyroxine 75 MCG tablet Commonly known as: SYNTHROID TAKE 1 TABLET BY MOUTH EVERY DAY BEFORE BREAKFAST   multivitamin capsule Take 1 capsule by mouth daily.   pioglitazone 30 MG tablet Commonly known as: ACTOS TAKE 1 TABLET BY MOUTH EVERY DAY   rosuvastatin 5 MG tablet Commonly known as: CRESTOR TAKE 1 TABLET BY MOUTH EVERY DAY   vitamin B-12 100 MCG tablet Commonly known as: CYANOCOBALAMIN Take 100 mcg by mouth 3 (three) times a week.   vitamin C 100 MG tablet Take 100 mg by mouth daily.       Allergies: No Known Allergies  Past Medical History:  Diagnosis Date  . Asthma   . Diabetes mellitus   . Diverticular disease   . Hypertension   . Subdural hematoma [facility]) May 2013   bilateral     Past Surgical History:   Procedure Laterality Date  . BURR HOLE  05/22/2011   Procedure: Haskell Flirt;  Surgeon: Elaina Hoops, MD;  Location: [place] NEURO ORS;  Service: Neurosurgery;  Laterality: Right;  Right Burr Holes for evacuation of subdural hematoma  . BURR HOLE  05/23/2011   Procedure: Haskell Flirt;  Surgeon: Elaina Hoops, MD;  Location: [place] NEURO ORS;  Service: Neurosurgery;  Laterality: Left;  Left [facility]  . CRANIOTOMY  06/14/2011   Procedure: CRANIOTOMY HEMATOMA EVACUATION SUBDURAL;  Surgeon: Elaina Hoops, MD;  Location: [place] NEURO ORS;  Service: Neurosurgery;  Laterality: N/A;  Left Craniotomy for subdural hematoma  . CRANIOTOMY  06/23/2011   Procedure: CRANIOTOMY HEMATOMA EVACUATION SUBDURAL;  Surgeon: Elaina Hoops, MD;  Location: [facility]  NEURO ORS;  Service: Neurosurgery;  Laterality: Left;  Redo Craniotomy for Subdural Hematoma  . HERNIA REPAIR    . INGUINAL HERNIA REPAIR Left 09/27/2016   Procedure: LAPAROSCOPIC LEFT INGUINAL HERNIA REPAIR;  Surgeon: Connor, Chelsea A, MD;  Location: [facility] ORS;  Service: General;  Laterality: Left;  . INSERTION OF MESH Left 09/27/2016   Procedure: INSERTION OF MESH;  Surgeon: Connor, Chelsea A, MD;  Location: [facility] ORS;  Service: General;  Laterality: Left;    Family History  Problem Relation Age of Onset  . Diabetes Mother   . Diabetes Father   . Heart attack Father   . Diabetes Sister   . Diabetes Brother   . Heart attack Brother     Social History:  reports that he quit smoking about 45 years ago. He has never used smokeless tobacco. He reports that he does not drink alcohol and does not use drugs.  Review of Systems:  Vitamin D deficiency: He is on a supplement with normal levels  Lab Results  Component Value Date   VD25OH 97.52 01/19/2020   VD25OH 89.04 06/16/2019     HYPERTENSION:  he has had hypertension for several years   Continues on AMLODIPINE 5 mg daily   Previously on 50 mg losartan with good control but this was because of creatinine of 1.6 in 12/18  He does  monitor blood pressure at home, has been consistently normal around 120-130/70+  BP Readings from Last 3 Encounters:  01/22/20 140/78  11/06/19 133/74  10/03/19 119/74      RENAL dysfunction: His creatinine is improved with switching from losartan to amlodipine No microalbuminuria   Lab Results  Component Value Date   CREATININE 1.23 01/19/2020   CREATININE 1.30 06/16/2019   CREATININE 1.34 09/03/2018    HYPERLIPIDEMIA: The lipid abnormality consists of elevated LDL, well controlled with 5 mg Crestor for several years LDL is below 100 consistently   Lab Results  Component Value Date   CHOL 165 01/19/2020   HDL 63.10 01/19/2020   LDLCALC 85 01/19/2020   TRIG 88.0 01/19/2020   CHOLHDL 3 01/19/2020    HYPOTHYROIDISM: he has had long-standing mild hypothyroidism since 1995  His prescription has been 75 mcg dosage consistently He feels fairly good  He has been on 6-1/2 tablets a week of his levothyroxine with stable thyroid levels  Thyroid levels as follows:  Lab Results  Component Value Date   TSH 1.57 01/19/2020   TSH 1.88 06/16/2019   TSH 2.26 09/03/2018   FREET4 1.10 06/16/2019   FREET4 1.15 09/03/2018   FREET4 1.04 12/31/2017    NEUROPATHY: Today is asking about some sensitivity and tingling in his feet although no numbness, pain  No difficulty with balance reported . Diabetic foot exam done in 6/21  ANEMIA: Patient had requested evaluation of his labs for anemia  He has had persistent mild anemia which is multifactorial and has been given  Fusion iron by PCP as before  Previously ferritin low Last colonoscopy 2017  Is taking his B12 supplement for pernicious anemia and recent level is normal  His hemoglobin is improved, he tends to have chronic anemia   CBC Latest Ref Rng & Units 01/19/2020 06/16/2019 09/03/2018  WBC 4.0 - 10.5 K/uL 5.6 5.4 5.1  Hemoglobin 13.0 - 17.0 g/dL 12.3(L) 11.4(L) 11.8(L)  Hematocrit 39.0 - 52.0 % 37.7(L) 34.2(L) 35.9(L)   Platelets 150.0 - 400.0 K/uL 221.0 272.0 211.0    Lab Results  Component Value Date     VITAMINB12 368 01/19/2020       Examination:   BP 140/78 (BP Location: Left Arm, Patient Position: Sitting, Cuff Size: Small)   Pulse 74   Temp 97.7 F (36.5 C) (Oral)   Resp 16   Ht 5' 11" (1.803 m)   Wt 174 lb (78.9 kg)   SpO2 99%   BMI 24.27 kg/m   Body mass index is 24.27 kg/m.    Repeat blood pressure 140/70  Feet are normal to inspection Vibration sense is absent on the right and markedly decreased on the left Monofilament sensation normal  ASSESSMENT/ PLAN:   Diabetes type 2 nonobese  See history of present illness for discussion of current diabetes management, blood sugar patterns and problems identified  He is on a stable 4 drug regimen of glipizide, Actos and Janumet Well-controlled with A1c of 6.5 again  Has been good with his diet and exercise regimen  Most of the readings are excellent and near normal at home although not clear if his meter is accurate since fasting lab glucose was 139  His medications will be continued unchanged Given the new Accu-Chek guide meter as his Aviva meter is old  NEUROPATHY: He likely has mild neuropathy with some tingling.  Objectively has absent vibration sense but normal monofilament sensation No difficulties with balance reported  HYPERTENSION: Blood pressure is well controlled with amlodipine May have minimal increase in the office today compared to home readings He will continue stay on amlodipine No microalbuminuria  Hypothyroidism: TSH is consistently normal and he will stay with Synthroid 75, 6-1/2 tablets a week    LIPIDS: Well-controlled, LDL 85, to continue rosuvastatin 5 mg  His vitamin D supplement may likely need to be stopped, he will need to check to see what dose he is taking  Anemia: He will discuss with PCP, ferritin pending     There are no Patient Instructions on file for this visit.      01/22/2020, 1:06 PM    

## 2020-01-23 ENCOUNTER — Ambulatory Visit: Payer: Medicare PPO | Admitting: Endocrinology

## 2020-02-13 ENCOUNTER — Other Ambulatory Visit: Payer: Self-pay | Admitting: Endocrinology

## 2020-03-23 DIAGNOSIS — E7800 Pure hypercholesterolemia, unspecified: Secondary | ICD-10-CM | POA: Diagnosis not present

## 2020-03-23 DIAGNOSIS — E1122 Type 2 diabetes mellitus with diabetic chronic kidney disease: Secondary | ICD-10-CM | POA: Diagnosis not present

## 2020-03-23 DIAGNOSIS — N183 Chronic kidney disease, stage 3 unspecified: Secondary | ICD-10-CM | POA: Diagnosis not present

## 2020-03-23 DIAGNOSIS — E538 Deficiency of other specified B group vitamins: Secondary | ICD-10-CM | POA: Diagnosis not present

## 2020-03-23 DIAGNOSIS — D509 Iron deficiency anemia, unspecified: Secondary | ICD-10-CM | POA: Diagnosis not present

## 2020-03-23 DIAGNOSIS — E039 Hypothyroidism, unspecified: Secondary | ICD-10-CM | POA: Diagnosis not present

## 2020-03-25 DIAGNOSIS — D509 Iron deficiency anemia, unspecified: Secondary | ICD-10-CM | POA: Diagnosis not present

## 2020-03-25 DIAGNOSIS — N1831 Chronic kidney disease, stage 3a: Secondary | ICD-10-CM | POA: Diagnosis not present

## 2020-03-25 DIAGNOSIS — E7800 Pure hypercholesterolemia, unspecified: Secondary | ICD-10-CM | POA: Diagnosis not present

## 2020-03-25 DIAGNOSIS — Z0001 Encounter for general adult medical examination with abnormal findings: Secondary | ICD-10-CM | POA: Diagnosis not present

## 2020-03-25 DIAGNOSIS — E039 Hypothyroidism, unspecified: Secondary | ICD-10-CM | POA: Diagnosis not present

## 2020-03-25 DIAGNOSIS — J4520 Mild intermittent asthma, uncomplicated: Secondary | ICD-10-CM | POA: Diagnosis not present

## 2020-03-25 DIAGNOSIS — I10 Essential (primary) hypertension: Secondary | ICD-10-CM | POA: Diagnosis not present

## 2020-03-25 DIAGNOSIS — R0982 Postnasal drip: Secondary | ICD-10-CM | POA: Diagnosis not present

## 2020-03-25 DIAGNOSIS — E1122 Type 2 diabetes mellitus with diabetic chronic kidney disease: Secondary | ICD-10-CM | POA: Diagnosis not present

## 2020-04-15 ENCOUNTER — Other Ambulatory Visit: Payer: Self-pay | Admitting: Endocrinology

## 2020-06-03 DIAGNOSIS — H524 Presbyopia: Secondary | ICD-10-CM | POA: Diagnosis not present

## 2020-06-03 DIAGNOSIS — H401210 Low-tension glaucoma, right eye, stage unspecified: Secondary | ICD-10-CM | POA: Diagnosis not present

## 2020-06-03 DIAGNOSIS — E119 Type 2 diabetes mellitus without complications: Secondary | ICD-10-CM | POA: Diagnosis not present

## 2020-06-03 DIAGNOSIS — H40012 Open angle with borderline findings, low risk, left eye: Secondary | ICD-10-CM | POA: Diagnosis not present

## 2020-06-03 DIAGNOSIS — H2512 Age-related nuclear cataract, left eye: Secondary | ICD-10-CM | POA: Diagnosis not present

## 2020-06-03 LAB — HM DIABETES EYE EXAM

## 2020-06-14 DIAGNOSIS — J309 Allergic rhinitis, unspecified: Secondary | ICD-10-CM | POA: Diagnosis not present

## 2020-06-14 DIAGNOSIS — R059 Cough, unspecified: Secondary | ICD-10-CM | POA: Diagnosis not present

## 2020-06-14 DIAGNOSIS — K219 Gastro-esophageal reflux disease without esophagitis: Secondary | ICD-10-CM | POA: Diagnosis not present

## 2020-06-14 DIAGNOSIS — R053 Chronic cough: Secondary | ICD-10-CM | POA: Diagnosis not present

## 2020-06-24 DIAGNOSIS — J984 Other disorders of lung: Secondary | ICD-10-CM | POA: Diagnosis not present

## 2020-06-24 DIAGNOSIS — R059 Cough, unspecified: Secondary | ICD-10-CM | POA: Diagnosis not present

## 2020-06-24 DIAGNOSIS — I700 Atherosclerosis of aorta: Secondary | ICD-10-CM | POA: Diagnosis not present

## 2020-06-24 DIAGNOSIS — I2510 Atherosclerotic heart disease of native coronary artery without angina pectoris: Secondary | ICD-10-CM | POA: Diagnosis not present

## 2020-06-24 DIAGNOSIS — R918 Other nonspecific abnormal finding of lung field: Secondary | ICD-10-CM | POA: Diagnosis not present

## 2020-07-19 ENCOUNTER — Other Ambulatory Visit (INDEPENDENT_AMBULATORY_CARE_PROVIDER_SITE_OTHER): Payer: Medicare PPO

## 2020-07-19 ENCOUNTER — Other Ambulatory Visit: Payer: Self-pay

## 2020-07-19 DIAGNOSIS — E559 Vitamin D deficiency, unspecified: Secondary | ICD-10-CM | POA: Diagnosis not present

## 2020-07-19 DIAGNOSIS — E119 Type 2 diabetes mellitus without complications: Secondary | ICD-10-CM

## 2020-07-19 DIAGNOSIS — E063 Autoimmune thyroiditis: Secondary | ICD-10-CM

## 2020-07-19 DIAGNOSIS — D508 Other iron deficiency anemias: Secondary | ICD-10-CM

## 2020-07-19 LAB — CBC
HCT: 36 % — ABNORMAL LOW (ref 39.0–52.0)
Hemoglobin: 11.9 g/dL — ABNORMAL LOW (ref 13.0–17.0)
MCHC: 33.1 g/dL (ref 30.0–36.0)
MCV: 89.9 fl (ref 78.0–100.0)
Platelets: 277 10*3/uL (ref 150.0–400.0)
RBC: 4.01 Mil/uL — ABNORMAL LOW (ref 4.22–5.81)
RDW: 13.8 % (ref 11.5–15.5)
WBC: 5.9 10*3/uL (ref 4.0–10.5)

## 2020-07-19 LAB — LIPID PANEL
Cholesterol: 174 mg/dL (ref 0–200)
HDL: 64.7 mg/dL (ref >39.00)
LDL Cholesterol: 95 mg/dL (ref 0–99)
NonHDL: 108.95
Total CHOL/HDL Ratio: 3
Triglycerides: 70 mg/dL (ref 0.0–149.0)
VLDL: 14 mg/dL (ref 0.0–40.0)

## 2020-07-19 LAB — COMPREHENSIVE METABOLIC PANEL
ALT: 20 U/L (ref 0–53)
AST: 26 U/L (ref 0–37)
Albumin: 4.5 g/dL (ref 3.5–5.2)
Alkaline Phosphatase: 64 U/L (ref 39–117)
BUN: 26 mg/dL — ABNORMAL HIGH (ref 6–23)
CO2: 27 mEq/L (ref 19–32)
Calcium: 9.5 mg/dL (ref 8.4–10.5)
Chloride: 103 mEq/L (ref 96–112)
Creatinine, Ser: 1.32 mg/dL (ref 0.40–1.50)
GFR: 50.37 mL/min — ABNORMAL LOW (ref >60.00)
Glucose, Bld: 130 mg/dL — ABNORMAL HIGH (ref 70–99)
Potassium: 4.4 mEq/L (ref 3.5–5.1)
Sodium: 139 mEq/L (ref 135–145)
Total Bilirubin: 0.6 mg/dL (ref 0.2–1.2)
Total Protein: 7.4 g/dL (ref 6.0–8.3)

## 2020-07-19 LAB — HEMOGLOBIN A1C: Hgb A1c MFr Bld: 6.5 % (ref 4.6–6.5)

## 2020-07-19 LAB — VITAMIN D 25 HYDROXY (VIT D DEFICIENCY, FRACTURES): VITD: 89.38 ng/mL (ref 30.00–100.00)

## 2020-07-19 LAB — TSH: TSH: 2.97 u[IU]/mL (ref 0.35–5.50)

## 2020-07-19 LAB — T4, FREE: Free T4: 1.02 ng/dL (ref 0.60–1.60)

## 2020-07-19 LAB — VITAMIN B12: Vitamin B-12: 316 pg/mL (ref 211–911)

## 2020-07-20 ENCOUNTER — Other Ambulatory Visit: Payer: Self-pay | Admitting: Endocrinology

## 2020-07-20 DIAGNOSIS — D508 Other iron deficiency anemias: Secondary | ICD-10-CM

## 2020-07-20 LAB — FERRITIN: Ferritin: 10 ng/mL — ABNORMAL LOW (ref 22.0–322.0)

## 2020-07-21 ENCOUNTER — Encounter: Payer: Self-pay | Admitting: Endocrinology

## 2020-07-22 ENCOUNTER — Encounter: Payer: Self-pay | Admitting: Endocrinology

## 2020-07-22 ENCOUNTER — Other Ambulatory Visit: Payer: Self-pay

## 2020-07-22 ENCOUNTER — Ambulatory Visit: Payer: Medicare PPO | Admitting: Endocrinology

## 2020-07-22 VITALS — BP 110/70 | HR 76 | Ht 70.5 in | Wt 172.4 lb

## 2020-07-22 DIAGNOSIS — E119 Type 2 diabetes mellitus without complications: Secondary | ICD-10-CM | POA: Diagnosis not present

## 2020-07-22 DIAGNOSIS — D508 Other iron deficiency anemias: Secondary | ICD-10-CM | POA: Diagnosis not present

## 2020-07-22 DIAGNOSIS — E7800 Pure hypercholesterolemia, unspecified: Secondary | ICD-10-CM | POA: Diagnosis not present

## 2020-07-22 DIAGNOSIS — E559 Vitamin D deficiency, unspecified: Secondary | ICD-10-CM

## 2020-07-22 DIAGNOSIS — I10 Essential (primary) hypertension: Secondary | ICD-10-CM | POA: Diagnosis not present

## 2020-07-22 DIAGNOSIS — E063 Autoimmune thyroiditis: Secondary | ICD-10-CM | POA: Diagnosis not present

## 2020-07-22 NOTE — Progress Notes (Signed)
Patient ID: James Juarez, male   DOB: Dec 21, 1940, 80 y.o.   MRN: [id number]   Reason for Appointment: follow-up of various problems  History of Present Illness   Diagnosis: Type 2 DIABETES MELITUS  Oral hypoglycemic drugs: Glipizide ER 5 mg, Actos 30 mg, and Janumet 50/1000 twice a day   He has had long-standing diabetes which has been well-controlled with the same 4 drug regimen which has been unchanged for several years  A1c is usually upper normal and is the same at 6.5  He is recently doing blood sugar monitoring mostly in the mornings Previously was doing more readings at night and these were averaging about 130  Lab glucose was 139 fasting He says he is concerned about his fasting readings being higher He was given a new meter on his last visit Generally diet has been fairly well controlled and only rarely getting more carbohydrates or sweets Trying to walk at least 4 days a week Weight is about the same Has been taking his medications consistently   GLUCOSE monitoring:  Glucometer:  Accu-Chek Aviva  Blood sugars from download as follows  FASTING blood sugar average 131 with a range 122-150   Side effects from medications: None       Diet: Generally low fat and small portions        Physical activity: exercise: Walking 20 min, 4-6/7 days a week              Wt Readings from Last 3 Encounters:  07/22/20 172 lb 6.4 oz (78.2 kg)  01/22/20 174 lb (78.9 kg)  11/06/19 176 lb (79.8 kg)     He has several other active problems which are discussed in review of systems   LABS:  Lab Results  Component Value Date   HGBA1C 6.5 07/19/2020   HGBA1C 6.5 01/19/2020   HGBA1C 6.5 06/16/2019   Lab Results  Component Value Date   MICROALBUR <0.7 01/19/2020   [place] 95 07/19/2020   CREATININE 1.32 07/19/2020     Abstract on 07/21/2020  Component Date Value Ref Range Status   HM Diabetic Eye Exam 06/03/2020 No Retinopathy  No Retinopathy Final  Lab on  07/19/2020  Component Date Value Ref Range Status   VITD 07/19/2020 89.38  30.00 - 100.00 ng/mL Final   Vitamin B-12 07/19/2020 316  211 - 911 pg/mL Final   WBC 07/19/2020 5.9  4.0 - 10.5 K/uL Final   RBC 07/19/2020 4.01 (A) 4.22 - 5.81 Mil/uL Final   Platelets 07/19/2020 277.0  150.0 - 400.0 K/uL Final   Hemoglobin 07/19/2020 11.9 (A) 13.0 - 17.0 g/dL Final   HCT 07/19/2020 36.0 (A) 39.0 - 52.0 % Final   MCV 07/19/2020 89.9  78.0 - 100.0 fl Final   MCHC 07/19/2020 33.1  30.0 - 36.0 g/dL Final   RDW 07/19/2020 13.8  11.5 - 15.5 % Final   Free T4 07/19/2020 1.02  0.60 - 1.60 ng/dL Final   Comment: Specimens from patients who are undergoing biotin therapy and /or ingesting biotin supplements may contain high levels of biotin.  The higher biotin concentration in these specimens interferes with this Free T4 assay.  Specimens that contain high levels  of biotin may cause false high results for this Free T4 assay.  Please interpret results in light of the total clinical presentation of the patient.     TSH 07/19/2020 2.97  0.35 - 5.50 uIU/mL Final   Cholesterol 07/19/2020 174  0 - 200 mg/dL  Final   ATP III Classification       Desirable:  < 200 mg/dL               Borderline High:  200 - 239 mg/dL          High:  > = 240 mg/dL   Triglycerides 07/19/2020 70.0  0.0 - 149.0 mg/dL Final   Normal:  <150 mg/dLBorderline High:  150 - 199 mg/dL   HDL 07/19/2020 64.70  >39.00 mg/dL Final   VLDL 07/19/2020 14.0  0.0 - 40.0 mg/dL Final   LDL Cholesterol 07/19/2020 95  0 - 99 mg/dL Final   Total CHOL/HDL Ratio 07/19/2020 3   Final                  Men          Women1/2 Average Risk     3.4          3.3Average Risk          5.0          4.42X Average Risk          9.6          7.13X Average Risk          15.0          11.0                       NonHDL 07/19/2020 108.95   Final   NOTE:  Non-HDL goal should be 30 mg/dL higher than patient's LDL goal (i.e. LDL goal of < 70 mg/dL, would have non-HDL goal of <  100 mg/dL)   Sodium 07/19/2020 139  135 - 145 mEq/L Final   Potassium 07/19/2020 4.4  3.5 - 5.1 mEq/L Final   Chloride 07/19/2020 103  96 - 112 mEq/L Final   CO2 07/19/2020 27  19 - 32 mEq/L Final   Glucose, Bld 07/19/2020 130 (A) 70 - 99 mg/dL Final   BUN 07/19/2020 26 (A) 6 - 23 mg/dL Final   Creatinine, Ser 07/19/2020 1.32  0.40 - 1.50 mg/dL Final   Total Bilirubin 07/19/2020 0.6  0.2 - 1.2 mg/dL Final   Alkaline Phosphatase 07/19/2020 64  39 - 117 U/L Final   AST 07/19/2020 26  0 - 37 U/L Final   ALT 07/19/2020 20  0 - 53 U/L Final   Total Protein 07/19/2020 7.4  6.0 - 8.3 g/dL Final   Albumin 07/19/2020 4.5  3.5 - 5.2 g/dL Final   GFR 07/19/2020 50.37 (A) >60.00 mL/min Final   Calculated using the CKD-EPI Creatinine Equation (2021)   Calcium 07/19/2020 9.5  8.4 - 10.5 mg/dL Final   Hgb A1c MFr Bld 07/19/2020 6.5  4.6 - 6.5 % Final   Glycemic Control Guidelines for People with Diabetes:Non Diabetic:  <6%Goal of Therapy: <7%Additional Action Suggested:  >8%    Ferritin 07/20/2020 10.0 (A) 22.0 - 322.0 ng/mL Final    Allergies as of 07/22/2020   No Known Allergies      Medication List        Accurate as of July 22, 2020  4:46 PM. If you have any questions, ask your nurse or doctor.          Accu-Chek Aviva Plus w/Device Kit Use to check blood sugars 2 times daily, Dx Code E11.65   Accu-Chek FastClix Lancets Misc Use to check blood sugar 2 times per day dx code E11.65   Accu-Chek  Guide test strip Generic drug: glucose blood Check blood sugar once daily   acetaminophen 325 MG tablet Commonly known as: TYLENOL Take 325 mg by mouth every 6 (six) hours as needed. For pain   albuterol 108 (90 Base) MCG/ACT inhaler Commonly known as: VENTOLIN HFA Inhale 2 puffs into the lungs every 6 (six) hours as needed. For shortness of breath   amLODipine 5 MG tablet Commonly known as: NORVASC TAKE 1 TABLET BY MOUTH EVERY DAY   cholecalciferol 25 MCG (1000 UNIT)  tablet Commonly known as: VITAMIN D3 Take 1,000 Units by mouth 3 (three) times a week.   clobetasol 0.05 % Gel Commonly known as: TEMOVATE Apply 1 application topically daily as needed. For face.   dorzolamide-timolol 22.3-6.8 MG/ML ophthalmic solution Commonly known as: COSOPT   ferrous sulfate 325 (65 FE) MG EC tablet Take 325 mg by mouth daily with breakfast.   fexofenadine 180 MG tablet Commonly known as: ALLEGRA Take 180 mg by mouth daily as needed. For allergies   glipiZIDE 5 MG 24 hr tablet Commonly known as: GLUCOTROL XL TAKE 1 TABLET BY MOUTH EVERY DAY   hydroxypropyl methylcellulose / hypromellose 2.5 % ophthalmic solution Commonly known as: ISOPTO TEARS / GONIOVISC Place 1 drop into both eyes at bedtime.   Janumet 50-1000 MG tablet Generic drug: sitaGLIPtin-metformin TAKE 1 TABLET BY MOUTH TWICE A DAY WITH MEALS   latanoprost 0.005 % ophthalmic solution Commonly known as: XALATAN SMARTSIG:1 Drop(s) In Eye(s) Every Evening   levothyroxine 75 MCG tablet Commonly known as: SYNTHROID TAKE 1 TABLET BY MOUTH EVERY DAY BEFORE BREAKFAST   multivitamin capsule Take 1 capsule by mouth daily.   pioglitazone 30 MG tablet Commonly known as: ACTOS TAKE 1 TABLET BY MOUTH EVERY DAY   rosuvastatin 5 MG tablet Commonly known as: CRESTOR TAKE 1 TABLET BY MOUTH EVERY DAY   vitamin B-12 100 MCG tablet Commonly known as: CYANOCOBALAMIN Take 100 mcg by mouth 3 (three) times a week.   vitamin C 100 MG tablet Take 100 mg by mouth daily.        Allergies: No Known Allergies  Past Medical History:  Diagnosis Date   Asthma    Diabetes mellitus    Diverticular disease    Hypertension    Subdural hematoma [facility]) May 2013   bilateral     Past Surgical History:  Procedure Laterality Date   Georganna Skeans  05/22/2011   Procedure: Haskell Flirt;  Surgeon: Elaina Hoops, MD;  Location: [facility] NEURO ORS;  Service: Neurosurgery;  Laterality: Right;  Right [facility] for evacuation of  subdural hematoma   BURR HOLE  05/23/2011   Procedure: Haskell Flirt;  Surgeon: Elaina Hoops, MD;  Location: [place] NEURO ORS;  Service: Neurosurgery;  Laterality: Left;  Left Georganna Skeans   CRANIOTOMY  06/14/2011   Procedure: CRANIOTOMY HEMATOMA EVACUATION SUBDURAL;  Surgeon: Elaina Hoops, MD;  Location: [place] NEURO ORS;  Service: Neurosurgery;  Laterality: N/A;  Left Craniotomy for subdural hematoma   CRANIOTOMY  06/23/2011   Procedure: CRANIOTOMY HEMATOMA EVACUATION SUBDURAL;  Surgeon: Elaina Hoops, MD;  Location: [place] NEURO ORS;  Service: Neurosurgery;  Laterality: Left;  Redo Craniotomy for Subdural Hematoma   HERNIA REPAIR     INGUINAL HERNIA REPAIR Left 09/27/2016   Procedure: LAPAROSCOPIC LEFT INGUINAL HERNIA REPAIR;  Surgeon: Clovis Riley, MD;  Location: [facility] ORS;  Service: General;  Laterality: Left;   INSERTION OF MESH Left 09/27/2016   Procedure: INSERTION OF MESH;  Surgeon:  Clovis Riley, MD;  Location: [facility] ORS;  Service: General;  Laterality: Left;    Family History  Problem Relation Age of Onset   Diabetes Mother    Diabetes Father    Heart attack Father    Diabetes Sister    Diabetes Brother    Heart attack Brother     Social History:  reports that he quit smoking about 45 years ago. His smoking use included cigarettes. He has never used smokeless tobacco. He reports that he does not drink alcohol and does not use drugs.  Review of Systems:  Vitamin D deficiency: He is on a supplement with normal levels  Lab Results  Component Value Date   VD25OH 89.38 07/19/2020   VD25OH 97.52 01/19/2020   VD25OH 89.04 06/16/2019     HYPERTENSION:  he has had hypertension for several years   He is on AMLODIPINE 5 mg daily   Previously on 50 mg losartan with good control but this was because of creatinine of 1.6 in 12/18  He does monitor blood pressure at home, has been consistently normal around 120-130/70+  BP Readings from Last 3 Encounters:  07/22/20 110/70  01/22/20 140/78  11/06/19  133/74      RENAL dysfunction: His creatinine is generally about the same  No microalbuminuria   Lab Results  Component Value Date   CREATININE 1.32 07/19/2020   CREATININE 1.23 01/19/2020   CREATININE 1.30 06/16/2019    HYPERLIPIDEMIA: The lipid abnormality consists of elevated LDL, well controlled with 5 mg Crestor for several years LDL is below 100 consistently   Lab Results  Component Value Date   CHOL 174 07/19/2020   CHOL 165 01/19/2020   CHOL 150 06/16/2019   Lab Results  Component Value Date   HDL 64.70 07/19/2020   HDL 63.10 01/19/2020   HDL 50.70 06/16/2019   Lab Results  Component Value Date   LDLCALC 95 07/19/2020   LDLCALC 85 01/19/2020   LDLCALC 85 06/16/2019   Lab Results  Component Value Date   TRIG 70.0 07/19/2020   TRIG 88.0 01/19/2020   TRIG 73.0 06/16/2019   Lab Results  Component Value Date   CHOLHDL 3 07/19/2020   CHOLHDL 3 01/19/2020   CHOLHDL 3 06/16/2019   No results found for: LDLDIRECT   HYPOTHYROIDISM: he has had long-standing mild hypothyroidism since 1995  His prescription has been 75 mcg dosage consistently He feels fairly good  He has been on 6-1/2 tablets a week of his levothyroxine with stable thyroid levels  Thyroid levels as follows:  Lab Results  Component Value Date   TSH 2.97 07/19/2020   TSH 1.57 01/19/2020   TSH 1.88 06/16/2019   FREET4 1.02 07/19/2020   FREET4 1.10 06/16/2019   FREET4 1.15 09/03/2018    NEUROPATHY: Today is asking about some tightness in his stools, not as much sensitivity and tingling in his feet   No difficulty with balance   ANEMIA: Patient had requested evaluation of his labs for anemia  He has had persistent mild anemia which is multifactorial and has been given  Fusion iron by PCP before but has not taken this  Previously ferritin low Last colonoscopy 2017  Is taking his B12 supplement for pernicious anemia and level is normal  His hemoglobin is variable, he tends to  have chronic anemia   CBC Latest Ref Rng & Units 07/19/2020 01/19/2020 06/16/2019  WBC 4.0 - 10.5 K/uL 5.9 5.6 5.4  Hemoglobin 13.0 - 17.0 g/dL 11.9(L)  12.3(L) 11.4(L)  Hematocrit 39.0 - 52.0 % 36.0(L) 37.7(L) 34.2(L)  Platelets 150.0 - 400.0 K/uL 277.0 221.0 272.0    Lab Results  Component Value Date   VITAMINB12 316 07/19/2020       Examination:   BP 110/70   Pulse 76   Ht 5' 10.5" (1.791 m)   Wt 172 lb 6.4 oz (78.2 kg)   SpO2 99%   BMI 24.39 kg/m   Body mass index is 24.39 kg/m.      Diabetic Foot Exam - Simple   Simple Foot Form Diabetic Foot exam was performed with the following findings: Yes   Visual Inspection No deformities, no ulcerations, no other skin breakdown bilaterally: Yes Sensation Testing Intact to touch and monofilament testing bilaterally: Yes Pulse Check Posterior Tibialis and Dorsalis pulse intact bilaterally: Yes Comments    External auricular canals normal on the left  ASSESSMENT/ PLAN:   Diabetes type 2 nonobese  See history of present illness for discussion of current diabetes management, blood sugar patterns and problems identified  He is on a stable 4 drug regimen of glipizide, Actos and Janumet Well-controlled with A1c of 6.5 again Has been following his diet and walking regularly Weight is stable Discussed that his fasting readings are mildly increased likely from a dawn phenomenon and circadian rhythm  His medications will be continued unchanged  NEUROPATHY: He likely has mild neuropathy with some mild sensory symptoms but no findings on exam  HYPERTENSION: Blood pressure is well controlled with amlodipine   Hypothyroidism: TSH is normal and he will stay with Synthroid 75, 6-1/2 tablets a week Discussed that his TSH is slightly higher than before but still possible in the normal range and likely appropriate for his age also   LIPIDS: Well-controlled, LDL below 100, to continue rosuvastatin 5 mg   Anemia: He will discuss  restarting iron with PCP, ferritin low     There are no Patient Instructions on file for this visit.   Elayne Snare 07/22/2020, 4:46 PM

## 2020-07-24 ENCOUNTER — Other Ambulatory Visit: Payer: Self-pay | Admitting: Endocrinology

## 2020-08-04 ENCOUNTER — Other Ambulatory Visit: Payer: Self-pay | Admitting: Endocrinology

## 2020-08-06 ENCOUNTER — Other Ambulatory Visit: Payer: Self-pay | Admitting: Endocrinology

## 2020-09-14 DIAGNOSIS — Z23 Encounter for immunization: Secondary | ICD-10-CM | POA: Diagnosis not present

## 2020-10-16 ENCOUNTER — Other Ambulatory Visit: Payer: Self-pay | Admitting: Endocrinology

## 2020-10-19 DIAGNOSIS — M542 Cervicalgia: Secondary | ICD-10-CM | POA: Diagnosis not present

## 2020-11-05 ENCOUNTER — Encounter: Payer: Self-pay | Admitting: Cardiology

## 2020-11-05 ENCOUNTER — Other Ambulatory Visit: Payer: Self-pay

## 2020-11-05 ENCOUNTER — Ambulatory Visit: Payer: Medicare PPO | Admitting: Cardiology

## 2020-11-05 VITALS — BP 130/72 | HR 74 | Temp 98.7°F | Resp 16 | Ht 70.5 in | Wt 176.0 lb

## 2020-11-05 DIAGNOSIS — E7800 Pure hypercholesterolemia, unspecified: Secondary | ICD-10-CM

## 2020-11-05 DIAGNOSIS — I10 Essential (primary) hypertension: Secondary | ICD-10-CM | POA: Diagnosis not present

## 2020-11-05 DIAGNOSIS — E119 Type 2 diabetes mellitus without complications: Secondary | ICD-10-CM | POA: Diagnosis not present

## 2020-11-05 MED ORDER — ROSUVASTATIN CALCIUM 10 MG PO TABS: 10.0000 mg | ORAL_TABLET | Freq: Every day | ORAL | 4 refills | Status: DC

## 2020-11-05 NOTE — Progress Notes (Signed)
Primary Physician/Referring:  Lujean Amel, MD  Patient ID: Roderick Pee, male    DOB: 04/23/1940, 80 y.o.   MRN: [id number]  Chief Complaint  Patient presents with   Hypertension   Follow-up    1 year   HPI:    Holman A Stahnke  is a 80 y.o. Asian [place] male with history of diabetes mellitus, controlled, hypertension and hyperlipidemia.  He was evaluated for subdural hematoma in May 2013, etiology could not be found.   He presents for routine follow-up for hypertension, hyperlipidemia. No new symptoms.  He continues to walk at least 30 minutes on a daily basis.  Past Medical History:  Diagnosis Date   Asthma    Diabetes mellitus    Diverticular disease    Hypertension    Subdural hematoma May 2013   bilateral    Past Surgical History:  Procedure Laterality Date   BURR HOLE  05/22/2011   Procedure: Haskell Flirt;  Surgeon: Elaina Hoops, MD;  Location: [place] NEURO ORS;  Service: Neurosurgery;  Laterality: Right;  Right [facility] for evacuation of subdural hematoma   BURR HOLE  05/23/2011   Procedure: Haskell Flirt;  Surgeon: Elaina Hoops, MD;  Location: [place] NEURO ORS;  Service: Neurosurgery;  Laterality: Left;  Left [facility] Skeans   CRANIOTOMY  06/14/2011   Procedure: CRANIOTOMY HEMATOMA EVACUATION SUBDURAL;  Surgeon: Elaina Hoops, MD;  Location: [place] NEURO ORS;  Service: Neurosurgery;  Laterality: N/A;  Left Craniotomy for subdural hematoma   CRANIOTOMY  06/23/2011   Procedure: CRANIOTOMY HEMATOMA EVACUATION SUBDURAL;  Surgeon: Elaina Hoops, MD;  Location: [place] NEURO ORS;  Service: Neurosurgery;  Laterality: Left;  Redo Craniotomy for Subdural Hematoma   HERNIA REPAIR     INGUINAL HERNIA REPAIR Left 09/27/2016   Procedure: LAPAROSCOPIC LEFT INGUINAL HERNIA REPAIR;  Surgeon: Clovis Riley, MD;  Location: [facility] ORS;  Service: General;  Laterality: Left;   INSERTION OF MESH Left 09/27/2016   Procedure: INSERTION OF MESH;  Surgeon: Clovis Riley, MD;  Location: [facility] ORS;  Service: General;  Laterality:  Left;   Family History  Problem Relation Age of Onset   Diabetes Mother    Diabetes Father    Heart attack Father    Diabetes Sister    Diabetes Brother    Heart attack Brother     Social History   Tobacco Use   Smoking status: Former    Types: Cigarettes    Quit date: 1977    Years since quitting: 45.8   Smokeless tobacco: Never  Substance Use Topics   Alcohol use: No   Marital Status: Married  ROS  Review of Systems  Cardiovascular:  Negative for chest pain, dyspnea on exertion and leg swelling.  Gastrointestinal:  Negative for melena.  Objective  Blood pressure (!) 145/69, pulse 74, temperature 98.7 F (37.1 C), temperature source Temporal, resp. rate 16, height 5' 10.5" (1.791 m), weight 176 lb (79.8 kg), SpO2 96 %.  Vitals with BMI 11/05/2020 07/22/2020 01/22/2020  Height 5' 10.5" 5' 10.5" 5' 11"   Weight 176 lbs 172 lbs 6 oz 174 lbs  BMI 24.89 67.89 38.10  Systolic 175 102 585  Diastolic 69 70 78  Pulse 74 76 74     Physical Exam Vitals reviewed.  Neck:     Vascular: No carotid bruit or JVD.  Cardiovascular:     Rate and Rhythm: Normal rate and regular rhythm.     Pulses: Intact distal pulses.  Heart sounds: S1 normal and S2 normal. Murmur heard.  Early systolic murmur is present with a grade of 2/6 at the upper right sternal border.    No gallop.  Pulmonary:     Effort: Pulmonary effort is normal. No respiratory distress.     Breath sounds: No wheezing, rhonchi or rales.  Abdominal:     General: Abdomen is flat. Bowel sounds are normal.     Palpations: Abdomen is soft.  Musculoskeletal:     Right lower leg: No edema.     Left lower leg: No edema.  Skin:    Capillary Refill: Capillary refill takes less than 2 seconds.   Laboratory examination:   Recent Labs    01/19/20 0927 07/19/20 0852  NA 136 139  K 4.3 4.4  CL 102 103  CO2 28 27  GLUCOSE 139* 130*  BUN 27* 26*  CREATININE 1.23 1.32  CALCIUM 9.7 9.5   CrCl cannot be calculated  (Patient's most recent lab result is older than the maximum 21 days allowed.).  CMP Latest Ref Rng & Units 07/19/2020 01/19/2020 06/16/2019  Glucose 70 - 99 mg/dL 130(H) 139(H) 112(H)  BUN 6 - 23 mg/dL 26(H) 27(H) 23  Creatinine 0.40 - 1.50 mg/dL 1.32 1.23 1.30  Sodium 135 - 145 mEq/L 139 136 135  Potassium 3.5 - 5.1 mEq/L 4.4 4.3 4.8  Chloride 96 - 112 mEq/L 103 102 103  CO2 19 - 32 mEq/L 27 28 27   Calcium 8.4 - 10.5 mg/dL 9.5 9.7 9.5  Total Protein 6.0 - 8.3 g/dL 7.4 7.4 7.2  Total Bilirubin 0.2 - 1.2 mg/dL 0.6 0.6 0.4  Alkaline Phos 39 - 117 U/L 64 50 63  AST 0 - 37 U/L 26 24 32  ALT 0 - 53 U/L 20 19 31    CBC Latest Ref Rng & Units 07/19/2020 01/19/2020 06/16/2019  WBC 4.0 - 10.5 K/uL 5.9 5.6 5.4  Hemoglobin 13.0 - 17.0 g/dL 11.9(L) 12.3(L) 11.4(L)  Hematocrit 39.0 - 52.0 % 36.0(L) 37.7(L) 34.2(L)  Platelets 150.0 - 400.0 K/uL 277.0 221.0 272.0   Lipid Panel Recent Labs    01/19/20 0927 07/19/20 0852  CHOL 165 174  TRIG 88.0 70.0  LDLCALC 85 95  VLDL 17.6 14.0  HDL 63.10 64.70  CHOLHDL 3 3    HEMOGLOBIN A1C Lab Results  Component Value Date   HGBA1C 6.5 07/19/2020   MPG 122.63 09/13/2016   TSH Recent Labs    01/19/20 0927 07/19/20 0852  TSH 1.57 2.97   Medications and allergies  No Known Allergies   Outpatient Medications Prior to Visit  Medication Sig Dispense Refill   ACCU-CHEK FASTCLIX LANCETS MISC Use to check blood sugar 2 times per day dx code E11.65 102 each 3   acetaminophen (TYLENOL) 325 MG tablet Take 325 mg by mouth every 6 (six) hours as needed. For pain     albuterol (PROVENTIL HFA;VENTOLIN HFA) 108 (90 BASE) MCG/ACT inhaler Inhale 2 puffs into the lungs every 6 (six) hours as needed. For shortness of breath     amLODipine (NORVASC) 5 MG tablet TAKE 1 TABLET BY MOUTH EVERY DAY 90 tablet 1   Ascorbic Acid (VITAMIN C) 100 MG tablet Take 100 mg by mouth daily.     Blood Glucose Monitoring Suppl (ACCU-CHEK AVIVA PLUS) w/Device KIT Use to check blood  sugars 2 times daily, Dx Code E11.65 1 kit 1   cholecalciferol (VITAMIN D3) 25 MCG (1000 UNIT) tablet Take 1,000 Units by mouth 3 (three)  times a week.     clobetasol (TEMOVATE) 0.05 % GEL Apply 1 application topically daily as needed. For face.     dorzolamide-timolol (COSOPT) 22.3-6.8 MG/ML ophthalmic solution      ferrous sulfate 325 (65 FE) MG EC tablet Take 325 mg by mouth daily with breakfast.      fexofenadine (ALLEGRA) 180 MG tablet Take 180 mg by mouth daily as needed. For allergies     glipiZIDE (GLUCOTROL XL) 5 MG 24 hr tablet TAKE 1 TABLET BY MOUTH EVERY DAY 90 tablet 1   glucose blood (ACCU-CHEK GUIDE) test strip Check blood sugar once daily 100 each 12   hydroxypropyl methylcellulose (ISOPTO TEARS) 2.5 % ophthalmic solution Place 1 drop into both eyes at bedtime.      JANUMET 50-1000 MG tablet TAKE 1 TABLET BY MOUTH TWICE A DAY WITH MEALS 180 tablet 1   latanoprost (XALATAN) 0.005 % ophthalmic solution SMARTSIG:1 Drop(s) In Eye(s) Every Evening     levothyroxine (SYNTHROID) 75 MCG tablet TAKE 1 TABLET BY MOUTH EVERY DAY BEFORE BREAKFAST 90 tablet 1   Multiple Vitamin (MULTIVITAMIN) capsule Take 1 capsule by mouth daily.     pioglitazone (ACTOS) 30 MG tablet TAKE 1 TABLET BY MOUTH EVERY DAY 90 tablet 0   rosuvastatin (CRESTOR) 5 MG tablet TAKE 1 TABLET BY MOUTH EVERY DAY 90 tablet 1   vitamin B-12 (CYANOCOBALAMIN) 100 MCG tablet Take 100 mcg by mouth 3 (three) times a week.      No facility-administered medications prior to visit.   Radiology:   No results found.  Cardiac Studies:   AAA screening: 02/07/10: No AAA. normal aorta. Mild plaque iliac arteries.  Treadmill Exercise Stress 10/15/2019: Normal treadmill exercise stress test. Resting ECG demonstrated normal sinus rhythm. Peak ECG demonstrated no ST-T wave abnormalities. The patient exercised for 6 minutes and 1 seconds on Bruce protocol; achieved 7.05 METs at 108% of maximum predicted heart rate. Chest pain is not  present. The heart rate response was accelerated. The blood pressure response was normal. Continue primary/secondary prevention  Echocardiogram 10/09/2019: Normal LV systolic function with visual EF 55-60%. Left ventricle cavity is normal in size. Normal global wall motion. Indeterminate diastolic filling pattern, normal LAP. Mild left ventricular hypertrophy. Calculated EF 56%. Left atrial cavity is mildly dilated. No significant valvular abnormalities. IVC is dilated with a respiratory response of >50%. No prior study for comparison.  Carotid artery duplex  10/09/2019: The bifurcation, internal, external and common carotid arteries reveal no evidence of significant stenosis, bilaterally. Antegrade right vertebral artery flow. Antegrade left vertebral artery flow.  EKG  EKG 11/05/2020: Normal sinus rhythm at rate of 74 bpm, left atrial enlargement, normal axis.  No evidence of ischemia otherwise normal EKG.  No significant change from 10/03/2019.  Assessment     ICD-10-CM   1. Primary hypertension  I10 EKG 12-Lead    2. Hypercholesteremia  E78.00     3. Type 2 diabetes mellitus without complication, without long-term current use of insulin (HCC)  E11.9        There are no discontinued medications.  No orders of the defined types were placed in this encounter.   Recommendations:   Rohit A Smelser is a 80 y.o. Ruhaan A Lill  is a 80 y.o. [place] male with history of diabetes mellitus, controlled, hypertension and hyperlipidemia.  He was evaluated for subdural hematoma in May 2013, etiology could not be found.   He presents for routine follow-up for hypertension, hyperlipidemia, he remains  asymptomatic and continues to remain active.  His physical examination is unchanged from prior examination done a year ago and he has got good vascular examination as well.  I do not hear any carotid artery bruit.  I reviewed his labs, LDL is >70 and in view of diabetes mellitus, will  increase Crestor from 5 mg to 10 mg daily.  We could certainly consider addition of Jardiance 10 mg for cardiovascular protection however he is closely being monitored by Dr. Elayne Snare.  His blood pressure is well controlled and his diabetes is also well controlled hence I did not make any changes with regard to this.  Otherwise from cardiac standpoint he remains stable without clinical evidence of heart failure and we have discussed regarding presentation of angina pectoris, I will see him back again in a year.     Adrian Prows, PA-C 11/05/2020, 1:21 PM Office: [phone]

## 2020-12-21 ENCOUNTER — Other Ambulatory Visit: Payer: Self-pay | Admitting: Endocrinology

## 2021-01-11 DIAGNOSIS — H9201 Otalgia, right ear: Secondary | ICD-10-CM | POA: Diagnosis not present

## 2021-01-11 DIAGNOSIS — H6121 Impacted cerumen, right ear: Secondary | ICD-10-CM | POA: Diagnosis not present

## 2021-01-24 ENCOUNTER — Other Ambulatory Visit: Payer: Self-pay | Admitting: Endocrinology

## 2021-01-24 ENCOUNTER — Other Ambulatory Visit: Payer: Self-pay

## 2021-01-24 ENCOUNTER — Other Ambulatory Visit (INDEPENDENT_AMBULATORY_CARE_PROVIDER_SITE_OTHER): Payer: Medicare PPO

## 2021-01-24 DIAGNOSIS — E559 Vitamin D deficiency, unspecified: Secondary | ICD-10-CM

## 2021-01-24 DIAGNOSIS — D508 Other iron deficiency anemias: Secondary | ICD-10-CM

## 2021-01-24 DIAGNOSIS — D513 Other dietary vitamin B12 deficiency anemia: Secondary | ICD-10-CM | POA: Diagnosis not present

## 2021-01-24 DIAGNOSIS — E119 Type 2 diabetes mellitus without complications: Secondary | ICD-10-CM | POA: Diagnosis not present

## 2021-01-24 LAB — VITAMIN D 25 HYDROXY (VIT D DEFICIENCY, FRACTURES): VITD: 93.75 ng/mL (ref 30.00–100.00)

## 2021-01-24 LAB — FERRITIN: Ferritin: 8.8 ng/mL — ABNORMAL LOW (ref 22.0–322.0)

## 2021-01-24 LAB — COMPREHENSIVE METABOLIC PANEL
ALT: 17 U/L (ref 0–53)
AST: 24 U/L (ref 0–37)
Albumin: 4.5 g/dL (ref 3.5–5.2)
Alkaline Phosphatase: 57 U/L (ref 39–117)
BUN: 30 mg/dL — ABNORMAL HIGH (ref 6–23)
CO2: 27 mEq/L (ref 19–32)
Calcium: 9.4 mg/dL (ref 8.4–10.5)
Chloride: 103 mEq/L (ref 96–112)
Creatinine, Ser: 1.37 mg/dL (ref 0.40–1.50)
GFR: 48 mL/min — ABNORMAL LOW (ref >60.00)
Glucose, Bld: 102 mg/dL — ABNORMAL HIGH (ref 70–99)
Potassium: 4.4 mEq/L (ref 3.5–5.1)
Sodium: 139 mEq/L (ref 135–145)
Total Bilirubin: 0.6 mg/dL (ref 0.2–1.2)
Total Protein: 7.2 g/dL (ref 6.0–8.3)

## 2021-01-24 LAB — LIPID PANEL
Cholesterol: 157 mg/dL (ref 0–200)
HDL: 62.7 mg/dL (ref >39.00)
LDL Cholesterol: 80 mg/dL (ref 0–99)
NonHDL: 94.79
Total CHOL/HDL Ratio: 3
Triglycerides: 72 mg/dL (ref 0.0–149.0)
VLDL: 14.4 mg/dL (ref 0.0–40.0)

## 2021-01-24 LAB — CBC
HCT: 37.2 % — ABNORMAL LOW (ref 39.0–52.0)
Hemoglobin: 12.1 g/dL — ABNORMAL LOW (ref 13.0–17.0)
MCHC: 32.4 g/dL (ref 30.0–36.0)
MCV: 90.4 fl (ref 78.0–100.0)
Platelets: 224 10*3/uL (ref 150.0–400.0)
RBC: 4.12 Mil/uL — ABNORMAL LOW (ref 4.22–5.81)
RDW: 14.3 % (ref 11.5–15.5)
WBC: 6.3 10*3/uL (ref 4.0–10.5)

## 2021-01-24 LAB — MICROALBUMIN / CREATININE URINE RATIO
Creatinine,U: 161 mg/dL
Microalb Creat Ratio: 0.7 mg/g (ref 0.0–30.0)
Microalb, Ur: 1.2 mg/dL (ref 0.0–1.9)

## 2021-01-24 LAB — T4, FREE: Free T4: 1.23 ng/dL (ref 0.60–1.60)

## 2021-01-24 LAB — HEMOGLOBIN A1C: Hgb A1c MFr Bld: 6.8 % — ABNORMAL HIGH (ref 4.6–6.5)

## 2021-01-24 LAB — VITAMIN B12: Vitamin B-12: 257 pg/mL (ref 211–911)

## 2021-01-24 LAB — TSH: TSH: 1.92 u[IU]/mL (ref 0.35–5.50)

## 2021-01-27 ENCOUNTER — Ambulatory Visit: Payer: Medicare PPO | Admitting: Endocrinology

## 2021-01-27 ENCOUNTER — Other Ambulatory Visit: Payer: Self-pay

## 2021-01-27 VITALS — BP 132/62 | HR 79 | Ht 70.0 in | Wt 172.6 lb

## 2021-01-27 DIAGNOSIS — E1140 Type 2 diabetes mellitus with diabetic neuropathy, unspecified: Secondary | ICD-10-CM

## 2021-01-27 DIAGNOSIS — I10 Essential (primary) hypertension: Secondary | ICD-10-CM

## 2021-01-27 DIAGNOSIS — E119 Type 2 diabetes mellitus without complications: Secondary | ICD-10-CM

## 2021-01-27 DIAGNOSIS — E063 Autoimmune thyroiditis: Secondary | ICD-10-CM | POA: Diagnosis not present

## 2021-01-27 DIAGNOSIS — E7800 Pure hypercholesterolemia, unspecified: Secondary | ICD-10-CM

## 2021-01-27 MED ORDER — L-METHYLFOLATE-B6-B12 3-35-2 MG PO TABS: 1.0000 | ORAL_TABLET | Freq: Two times a day (BID) | ORAL | 5 refills | Status: AC

## 2021-01-27 NOTE — Patient Instructions (Signed)
Check blood sugars on waking up 3 days a week  Also check blood sugars about 2 hours after meals and do this after different meals by rotation  Recommended blood sugar levels on waking up are 90-130 and about 2 hours after meal is 130-160  Please bring your blood sugar monitor to each visit, thank you   

## 2021-01-27 NOTE — Progress Notes (Signed)
Patient ID: James Juarez, male   DOB: Aug 27, 1940, 81 y.o.   MRN: [id number]   Reason for Appointment: follow-up of various problems  History of Present Illness   Diagnosis: Type 2 DIABETES MELITUS   Oral hypoglycemic drugs: Glipizide ER 5 mg, Actos 30 mg, and Janumet 50/1000 twice a day   He has had long-standing diabetes which has been well-controlled with the same 4 drug regimen which has been unchanged for several years  A1c is usually upper normal and is now slightly higher at 6.8 compared to 6.5  He is blaming has higher A1c on getting some desserts regularly and also attending a couple of weddings in the last couple of months However his weight is about the same He says he still walking 5 days a week Still has relatively higher fasting readings However not doing any postprandial readings but a few readings sporadically at bedtime Lab glucose late morning was 102 Has been taking his medications without side effects   GLUCOSE monitoring:  Glucometer:  Accu-Chek Aviva  Blood sugars from monitor reviewed as follows   PRE-MEAL Fasting Lunch Dinner Bedtime Overall  Glucose range: 112-141   100-189   Mean/median:     136   POST-MEAL PC Breakfast PC Lunch PC Dinner  Glucose range:  155   Mean/median:      PREVIOUSLY:  FASTING blood sugar average 131 with a range 122-150   Side effects from medications: None       Diet: Generally low fat and small portions        Physical activity: exercise: Walking 20 min, 4-6/7 days a week              Wt Readings from Last 3 Encounters:  01/27/21 172 lb 9.6 oz (78.3 kg)  11/05/20 176 lb (79.8 kg)  07/22/20 172 lb 6.4 oz (78.2 kg)     He has several other active problems which are discussed in review of systems   LABS:  Lab Results  Component Value Date   HGBA1C 6.8 (H) 01/24/2021   HGBA1C 6.5 07/19/2020   HGBA1C 6.5 01/19/2020   Lab Results  Component Value Date   MICROALBUR 1.2 01/24/2021   LDLCALC 80  01/24/2021   CREATININE 1.37 01/24/2021     Lab on 01/24/2021  Component Date Value Ref Range Status   Ferritin 01/24/2021 8.8 (L)  22.0 - 322.0 ng/mL Final   VITD 01/24/2021 93.75  30.00 - 100.00 ng/mL Final   WBC 01/24/2021 6.3  4.0 - 10.5 K/uL Final   RBC 01/24/2021 4.12 (L)  4.22 - 5.81 Mil/uL Final   Platelets 01/24/2021 224.0  150.0 - 400.0 K/uL Final   Hemoglobin 01/24/2021 12.1 (L)  13.0 - 17.0 g/dL Final   HCT 01/24/2021 37.2 (L)  39.0 - 52.0 % Final   MCV 01/24/2021 90.4  78.0 - 100.0 fl Final   MCHC 01/24/2021 32.4  30.0 - 36.0 g/dL Final   RDW 01/24/2021 14.3  11.5 - 15.5 % Final   Free T4 01/24/2021 1.23  0.60 - 1.60 ng/dL Final   Comment: Specimens from patients who are undergoing biotin therapy and /or ingesting biotin supplements may contain high levels of biotin.  The higher biotin concentration in these specimens interferes with this Free T4 assay.  Specimens that contain high levels  of biotin may cause false high results for this Free T4 assay.  Please interpret results in light of the total clinical presentation of the patient.  TSH 01/24/2021 1.92  0.35 - 5.50 uIU/mL Final   Cholesterol 01/24/2021 157  0 - 200 mg/dL Final   ATP III Classification       Desirable:  < 200 mg/dL               Borderline High:  200 - 239 mg/dL          High:  > = 240 mg/dL   Triglycerides 01/24/2021 72.0  0.0 - 149.0 mg/dL Final   Normal:  <150 mg/dLBorderline High:  150 - 199 mg/dL   HDL 01/24/2021 62.70  >39.00 mg/dL Final   VLDL 01/24/2021 14.4  0.0 - 40.0 mg/dL Final   LDL Cholesterol 01/24/2021 80  0 - 99 mg/dL Final   Total CHOL/HDL Ratio 01/24/2021 3   Final                  Men          Women1/2 Average Risk     3.4          3.3Average Risk          5.0          4.42X Average Risk          9.6          7.13X Average Risk          15.0          11.0                       NonHDL 01/24/2021 94.79   Final   NOTE:  Non-HDL goal should be 30 mg/dL higher than patient's LDL goal  (i.e. LDL goal of < 70 mg/dL, would have non-HDL goal of < 100 mg/dL)   Microalb, Ur 01/24/2021 1.2  0.0 - 1.9 mg/dL Final   Creatinine,U 01/24/2021 161.0  mg/dL Final   Microalb Creat Ratio 01/24/2021 0.7  0.0 - 30.0 mg/g Final   Sodium 01/24/2021 139  135 - 145 mEq/L Final   Potassium 01/24/2021 4.4  3.5 - 5.1 mEq/L Final   Chloride 01/24/2021 103  96 - 112 mEq/L Final   CO2 01/24/2021 27  19 - 32 mEq/L Final   Glucose, Bld 01/24/2021 102 (H)  70 - 99 mg/dL Final   BUN 01/24/2021 30 (H)  6 - 23 mg/dL Final   Creatinine, Ser 01/24/2021 1.37  0.40 - 1.50 mg/dL Final   Total Bilirubin 01/24/2021 0.6  0.2 - 1.2 mg/dL Final   Alkaline Phosphatase 01/24/2021 57  39 - 117 U/L Final   AST 01/24/2021 24  0 - 37 U/L Final   ALT 01/24/2021 17  0 - 53 U/L Final   Total Protein 01/24/2021 7.2  6.0 - 8.3 g/dL Final   Albumin 01/24/2021 4.5  3.5 - 5.2 g/dL Final   GFR 01/24/2021 48.00 (L)  >60.00 mL/min Final   Calculated using the CKD-EPI Creatinine Equation (2021)   Calcium 01/24/2021 9.4  8.4 - 10.5 mg/dL Final   Hgb A1c MFr Bld 01/24/2021 6.8 (H)  4.6 - 6.5 % Final   Glycemic Control Guidelines for People with Diabetes:Non Diabetic:  <6%Goal of Therapy: <7%Additional Action Suggested:  >8%    Vitamin B-12 01/24/2021 257  211 - 911 pg/mL Final    Allergies as of 01/27/2021   No Known Allergies      Medication List        Accurate as of January 27, 2021  1:34  PM. If you have any questions, ask your nurse or doctor.          Accu-Chek Aviva Plus w/Device Kit Use to check blood sugars 2 times daily, Dx Code E11.65   Accu-Chek FastClix Lancets Misc Use to check blood sugar 2 times per day dx code E11.65   Accu-Chek Guide test strip Generic drug: glucose blood Check blood sugar once daily   acetaminophen 325 MG tablet Commonly known as: TYLENOL Take 325 mg by mouth every 6 (six) hours as needed. For pain   albuterol 108 (90 Base) MCG/ACT inhaler Commonly known as: VENTOLIN  HFA Inhale 2 puffs into the lungs every 6 (six) hours as needed. For shortness of breath   amLODipine 5 MG tablet Commonly known as: NORVASC TAKE 1 TABLET BY MOUTH EVERY DAY   cholecalciferol 25 MCG (1000 UNIT) tablet Commonly known as: VITAMIN D3 Take 1,000 Units by mouth 3 (three) times a week.   clobetasol 0.05 % Gel Commonly known as: TEMOVATE Apply 1 application topically daily as needed. For face.   dorzolamide-timolol 22.3-6.8 MG/ML ophthalmic solution Commonly known as: COSOPT   ferrous sulfate 325 (65 FE) MG EC tablet Take 325 mg by mouth daily with breakfast.   fexofenadine 180 MG tablet Commonly known as: ALLEGRA Take 180 mg by mouth daily as needed. For allergies   glipiZIDE 5 MG 24 hr tablet Commonly known as: GLUCOTROL XL TAKE 1 TABLET BY MOUTH EVERY DAY   hydroxypropyl methylcellulose / hypromellose 2.5 % ophthalmic solution Commonly known as: ISOPTO TEARS / GONIOVISC Place 1 drop into both eyes at bedtime.   Janumet 50-1000 MG tablet Generic drug: sitaGLIPtin-metformin TAKE 1 TABLET BY MOUTH TWICE A DAY WITH MEALS   latanoprost 0.005 % ophthalmic solution Commonly known as: XALATAN SMARTSIG:1 Drop(s) In Eye(s) Every Evening   levothyroxine 75 MCG tablet Commonly known as: SYNTHROID TAKE 1 TABLET BY MOUTH EVERY DAY BEFORE BREAKFAST   multivitamin capsule Take 1 capsule by mouth daily.   pioglitazone 30 MG tablet Commonly known as: ACTOS TAKE 1 TABLET BY MOUTH EVERY DAY   rosuvastatin 10 MG tablet Commonly known as: CRESTOR Take 1 tablet (10 mg total) by mouth daily.   vitamin B-12 100 MCG tablet Commonly known as: CYANOCOBALAMIN Take 100 mcg by mouth 3 (three) times a week.   vitamin C 100 MG tablet Take 100 mg by mouth daily.        Allergies: No Known Allergies  Past Medical History:  Diagnosis Date   Asthma    Diabetes mellitus    Diverticular disease    Hypertension    Subdural hematoma May 2013   bilateral     Past  Surgical History:  Procedure Laterality Date   BURR HOLE  05/22/2011   Procedure: Haskell Flirt;  Surgeon: Elaina Hoops, MD;  Location: [facility];  Service: Neurosurgery;  Laterality: Right;  Right [facility] for evacuation of subdural hematoma   BURR HOLE  05/23/2011   Procedure: Haskell Flirt;  Surgeon: Elaina Hoops, MD;  Location: [place] [facility];  Service: Neurosurgery;  Laterality: Left;  Left [facility] Skeans   CRANIOTOMY  06/14/2011   Procedure: CRANIOTOMY HEMATOMA EVACUATION SUBDURAL;  Surgeon: Elaina Hoops, MD;  Location: [place] [facility];  Service: Neurosurgery;  Laterality: N/A;  Left Craniotomy for subdural hematoma   CRANIOTOMY  06/23/2011   Procedure: CRANIOTOMY HEMATOMA EVACUATION SUBDURAL;  Surgeon: Elaina Hoops, MD;  Location: [place] [facility]UR[facility];  Service: Neurosurgery;  Laterality: Left;  Redo Craniotomy for Subdural Hematoma   HERNIA REPAIR     INGUINAL HERNIA REPAIR Left 09/27/2016   Procedure: LAPAROSCOPIC LEFT INGUINAL HERNIA REPAIR;  Surgeon: Clovis Riley, MD;  Location: [facility];  Service: General;  Laterality: Left;   INSERTION OF MESH Left 09/27/2016   Procedure: INSERTION OF MESH;  Surgeon: Clovis Riley, MD;  Location: [facility] ORS;  Service: General;  Laterality: Left;    Family History  Problem Relation Age of Onset   Diabetes Mother    Diabetes Father    Heart attack Father    Diabetes Sister    Diabetes Brother    Heart attack Brother     Social History:  reports that he quit smoking about 46 years ago. His smoking use included cigarettes. He has never used smokeless tobacco. He reports that he does not drink alcohol and does not use drugs.  Review of Systems:  Vitamin D deficiency: He does not think he is taking any supplements, his levels are usually high normal  Lab Results  Component Value Date   VD25OH 93.75 01/24/2021   VD25OH 89.38 07/19/2020   VD25OH 97.52 01/19/2020   VD25OH 89.04 06/16/2019     HYPERTENSION:  he has had hypertension for several years   He is on  AMLODIPINE 5 mg daily   Previously on 50 mg losartan with good control but this was because of creatinine of 1.6 in 12/18  He does monitor blood pressure at home, has been consistently normal  May tend to be slightly higher in the office  BP Readings from Last 3 Encounters:  01/27/21 132/62  11/05/20 130/72  07/22/20 110/70      RENAL dysfunction: His creatinine is generally about the same  No microalbuminuria   Lab Results  Component Value Date   CREATININE 1.37 01/24/2021   CREATININE 1.32 07/19/2020   CREATININE 1.23 01/19/2020    HYPERLIPIDEMIA: The lipid abnormality consists of elevated LDL, well controlled with 5 mg Crestor for several years LDL is below 100 consistently   Lab Results  Component Value Date   CHOL 157 01/24/2021   CHOL 174 07/19/2020   CHOL 165 01/19/2020   Lab Results  Component Value Date   HDL 62.70 01/24/2021   HDL 64.70 07/19/2020   HDL 63.10 01/19/2020   Lab Results  Component Value Date   LDLCALC 80 01/24/2021   [place] 95 07/19/2020   LDLCALC 85 01/19/2020   Lab Results  Component Value Date   TRIG 72.0 01/24/2021   TRIG 70.0 07/19/2020   TRIG 88.0 01/19/2020   Lab Results  Component Value Date   CHOLHDL 3 01/24/2021   CHOLHDL 3 07/19/2020   CHOLHDL 3 01/19/2020   No results found for: LDLDIRECT   HYPOTHYROIDISM: he has had long-standing mild hypothyroidism since 1995  His prescription has been 75 mcg dosage consistently He feels fairly good  He has been on 6-1/2 tablets a week of his levothyroxine with stable thyroid levels  Thyroid levels as follows:  Lab Results  Component Value Date   TSH 1.92 01/24/2021   TSH 2.97 07/19/2020   TSH 1.57 01/19/2020   FREET4 1.23 01/24/2021   FREET4 1.02 07/19/2020   FREET4 1.10 06/16/2019    NEUROPATHY: Again is asking about some tightness in his distal feet and toes and mild numbness, no significant sensitivity and tingling in his feet  No difficulty with balance    ANEMIA: Patient had requested evaluation of his labs for anemia  He  has had persistent mild anemia which is multifactorial and has been given  Fusion iron by PCP before but has not taken this for some time He says he gets hives with this and has not discussed replacement with PCP  Ferritin consistently low Last colonoscopy 2017  Is currently not taking his B12 supplement for pernicious anemia and level is normal  His hemoglobin is variable, he tends to have chronic anemia   CBC Latest Ref Rng & Units 01/24/2021 07/19/2020 01/19/2020  WBC 4.0 - 10.5 K/uL 6.3 5.9 5.6  Hemoglobin 13.0 - 17.0 g/dL 12.1(L) 11.9(L) 12.3(L)  Hematocrit 39.0 - 52.0 % 37.2(L) 36.0(L) 37.7(L)  Platelets 150.0 - 400.0 K/uL 224.0 277.0 221.0    Lab Results  Component Value Date   VITAMINB12 257 01/24/2021       Examination:   BP 132/62    Pulse 79    Ht 5' 10"  (1.778 m)    Wt 172 lb 9.6 oz (78.3 kg)    SpO2 93%    BMI 24.77 kg/m   Body mass index is 24.77 kg/m.    Monofilament sensation normal in the toes No edema  ASSESSMENT/ PLAN:   Diabetes type 2 nonobese  See history of present illness for discussion of current diabetes management, blood sugar patterns and problems identified  He is on a stable 4 drug regimen of glipizide, Actos and Janumet A1c is slightly higher at 6.8 He has been walking regularly Weight is stable Discussed that his fasting readings are not much higher but may be having high postprandial readings He will start checking 2 hours after eating He can likely cut back on portions of sweets and desserts  He may benefit from 45 mg of Actos but he is reluctant to change it now  NEUROPATHY: He has mild neuropathy with some mild sensory symptoms but no loss of sensation on exam Since he is concerned about this we will try him on Metanx  HYPERTENSION: Blood pressure is well controlled with amlodipine   Hypothyroidism: TSH is normal again with Synthroid 75, 6-1/2 tablets a  week She will continue the same dose   LIPIDS: Well-controlled, LDL below 100, to continue rosuvastatin 5 mg   Anemia: He will discuss trying a new preparation of iron with PCP, ferritin significantly low again     Patient Instructions  Check blood sugars on waking up 3 days a week  Also check blood sugars about 2 hours after meals and do this after different meals by rotation  Recommended blood sugar levels on waking up are 90-130 and about 2 hours after meal is 130-160  Please bring your blood sugar monitor to each visit, thank you    Elayne Snare 01/27/2021, 1:34 PM

## 2021-02-21 DIAGNOSIS — H40012 Open angle with borderline findings, low risk, left eye: Secondary | ICD-10-CM | POA: Diagnosis not present

## 2021-02-21 DIAGNOSIS — H40032 Anatomical narrow angle, left eye: Secondary | ICD-10-CM | POA: Diagnosis not present

## 2021-02-21 DIAGNOSIS — H401210 Low-tension glaucoma, right eye, stage unspecified: Secondary | ICD-10-CM | POA: Diagnosis not present

## 2021-03-19 ENCOUNTER — Other Ambulatory Visit: Payer: Self-pay | Admitting: Endocrinology

## 2021-03-22 ENCOUNTER — Telehealth: Payer: Self-pay

## 2021-03-22 NOTE — Telephone Encounter (Signed)
Patient called in about needing refill on test strips. Called patient back and let him know that Rx was sent yesterday. ?

## 2021-04-04 ENCOUNTER — Other Ambulatory Visit: Payer: Self-pay | Admitting: Endocrinology

## 2021-04-05 DIAGNOSIS — E039 Hypothyroidism, unspecified: Secondary | ICD-10-CM | POA: Diagnosis not present

## 2021-04-05 DIAGNOSIS — N183 Chronic kidney disease, stage 3 unspecified: Secondary | ICD-10-CM | POA: Diagnosis not present

## 2021-04-05 DIAGNOSIS — E538 Deficiency of other specified B group vitamins: Secondary | ICD-10-CM | POA: Diagnosis not present

## 2021-04-05 DIAGNOSIS — E1122 Type 2 diabetes mellitus with diabetic chronic kidney disease: Secondary | ICD-10-CM | POA: Diagnosis not present

## 2021-04-05 DIAGNOSIS — E7800 Pure hypercholesterolemia, unspecified: Secondary | ICD-10-CM | POA: Diagnosis not present

## 2021-04-06 ENCOUNTER — Ambulatory Visit: Payer: Medicare Other | Admitting: Podiatry

## 2021-04-08 DIAGNOSIS — E1122 Type 2 diabetes mellitus with diabetic chronic kidney disease: Secondary | ICD-10-CM | POA: Diagnosis not present

## 2021-04-08 DIAGNOSIS — E7800 Pure hypercholesterolemia, unspecified: Secondary | ICD-10-CM | POA: Diagnosis not present

## 2021-04-08 DIAGNOSIS — E039 Hypothyroidism, unspecified: Secondary | ICD-10-CM | POA: Diagnosis not present

## 2021-04-08 DIAGNOSIS — D509 Iron deficiency anemia, unspecified: Secondary | ICD-10-CM | POA: Diagnosis not present

## 2021-04-08 DIAGNOSIS — Z0001 Encounter for general adult medical examination with abnormal findings: Secondary | ICD-10-CM | POA: Diagnosis not present

## 2021-04-08 DIAGNOSIS — J4520 Mild intermittent asthma, uncomplicated: Secondary | ICD-10-CM | POA: Diagnosis not present

## 2021-04-08 DIAGNOSIS — I10 Essential (primary) hypertension: Secondary | ICD-10-CM | POA: Diagnosis not present

## 2021-04-08 DIAGNOSIS — N1831 Chronic kidney disease, stage 3a: Secondary | ICD-10-CM | POA: Diagnosis not present

## 2021-04-08 DIAGNOSIS — J387 Other diseases of larynx: Secondary | ICD-10-CM | POA: Diagnosis not present

## 2021-04-10 ENCOUNTER — Other Ambulatory Visit: Payer: Self-pay | Admitting: Endocrinology

## 2021-04-26 ENCOUNTER — Other Ambulatory Visit: Payer: Self-pay | Admitting: Endocrinology

## 2021-04-27 ENCOUNTER — Ambulatory Visit: Payer: Self-pay | Admitting: Podiatry

## 2021-04-28 ENCOUNTER — Other Ambulatory Visit: Payer: Medicare PPO

## 2021-05-02 ENCOUNTER — Ambulatory Visit: Payer: Medicare PPO | Admitting: Endocrinology

## 2021-05-04 DIAGNOSIS — R0781 Pleurodynia: Secondary | ICD-10-CM | POA: Diagnosis not present

## 2021-05-05 ENCOUNTER — Other Ambulatory Visit: Payer: Self-pay | Admitting: Family Medicine

## 2021-05-05 ENCOUNTER — Ambulatory Visit
Admission: RE | Admit: 2021-05-05 | Discharge: 2021-05-05 | Disposition: A | Discharge status: Home / Self Care | Payer: Medicare PPO | Source: Ambulatory Visit | Attending: Family Medicine | Admitting: Family Medicine

## 2021-05-05 DIAGNOSIS — R52 Pain, unspecified: Secondary | ICD-10-CM

## 2021-05-05 DIAGNOSIS — R079 Chest pain, unspecified: Secondary | ICD-10-CM | POA: Diagnosis not present

## 2021-05-30 ENCOUNTER — Other Ambulatory Visit: Payer: Self-pay | Admitting: Endocrinology

## 2021-06-01 ENCOUNTER — Ambulatory Visit: Payer: Self-pay | Admitting: Podiatry

## 2021-06-08 ENCOUNTER — Ambulatory Visit: Payer: Self-pay | Admitting: Podiatry

## 2021-07-13 ENCOUNTER — Ambulatory Visit: Payer: Medicare PPO | Admitting: Podiatry

## 2021-08-03 ENCOUNTER — Other Ambulatory Visit: Payer: Self-pay | Admitting: Endocrinology

## 2021-08-03 DIAGNOSIS — Q383 Other congenital malformations of tongue: Secondary | ICD-10-CM | POA: Diagnosis not present

## 2021-08-10 ENCOUNTER — Encounter: Payer: Self-pay | Admitting: Podiatry

## 2021-08-10 ENCOUNTER — Ambulatory Visit: Payer: Medicare PPO | Admitting: Podiatry

## 2021-08-10 DIAGNOSIS — E1142 Type 2 diabetes mellitus with diabetic polyneuropathy: Secondary | ICD-10-CM | POA: Diagnosis not present

## 2021-08-10 NOTE — Progress Notes (Signed)
  Subjective:  Patient ID: James Juarez, male    DOB: Jun 01, 1940,   MRN: [id number]  No chief complaint on file.   81 y.o. male presents for concern of numbness and heaviness in his feet. Relates this has been going on for several weeks. Relates it is mostly at night and in the morning when it bothers him. He is diabetic and his last A1c was 6.8. He is not on gabapentin or lyrica.   PCP:  Lujean Amel, MD    . Denies any other pedal complaints. Denies n/v/f/c.   Past Medical History:  Diagnosis Date   Asthma    Diabetes mellitus    Diverticular disease    Hypertension    Subdural hematoma May 2013   bilateral     Objective:  Physical Exam: Vascular: DP/PT pulses 2/4 bilateral. CFT <3 seconds. Absent hair growth on digits. Edema noted to bilateral lower extremities. Xerosis noted bilaterally.  Skin. No lacerations or abrasions bilateral feet. Nails 1-5 bilateral  are normal in appearance Musculoskeletal: MMT 5/5 bilateral lower extremities in DF, PF, Inversion and Eversion. Deceased ROM in DF of ankle joint.  Neurological: Sensation intact to light touch. Protective sensation intact bilateral.    Assessment:   1. Type 2 diabetes mellitus with diabetic polyneuropathy, unspecified whether long term insulin use (HCC)   2. Pain due to onychomycosis of toenails of both feet      Plan:  Patient was evaluated and treated and all questions answered. Discussed neuropathy and etiology as well as treatment with patient.  Radiographs reviewed and discussed with patient.  -Discussed and educated patient on diabetic foot care, especially with  regards to the vascular, neurological and musculoskeletal systems.  -Stressed the importance of good glycemic control and the detriment of not  controlling glucose levels in relation to the foot. -Discussed supportive shoes at all times and checking feet regularly.  -Discussed topicals as treatment options.  -Patient to return in 1 year for  diabetic foot check.     Lorenda Peck, DPM

## 2021-08-11 ENCOUNTER — Encounter: Payer: Self-pay | Admitting: Endocrinology

## 2021-08-12 ENCOUNTER — Telehealth: Payer: Self-pay

## 2021-08-12 NOTE — Telephone Encounter (Signed)
Was forwarded to provider on a different encounter

## 2021-08-12 NOTE — Telephone Encounter (Signed)
Patient called in and requesting these labs be placed so they can be drawn when he comes in for his lab work Vit B12,Vit D, Ferritin, Urine, and Thyroid levels.

## 2021-08-15 ENCOUNTER — Other Ambulatory Visit: Payer: Self-pay | Admitting: Endocrinology

## 2021-08-15 DIAGNOSIS — E063 Autoimmune thyroiditis: Secondary | ICD-10-CM

## 2021-08-15 DIAGNOSIS — D513 Other dietary vitamin B12 deficiency anemia: Secondary | ICD-10-CM

## 2021-08-15 DIAGNOSIS — E1140 Type 2 diabetes mellitus with diabetic neuropathy, unspecified: Secondary | ICD-10-CM

## 2021-08-15 DIAGNOSIS — E559 Vitamin D deficiency, unspecified: Secondary | ICD-10-CM

## 2021-08-16 ENCOUNTER — Other Ambulatory Visit: Payer: Self-pay | Admitting: *Deleted

## 2021-08-16 ENCOUNTER — Other Ambulatory Visit (INDEPENDENT_AMBULATORY_CARE_PROVIDER_SITE_OTHER): Payer: Medicare PPO

## 2021-08-16 DIAGNOSIS — E119 Type 2 diabetes mellitus without complications: Secondary | ICD-10-CM | POA: Diagnosis not present

## 2021-08-16 DIAGNOSIS — E063 Autoimmune thyroiditis: Secondary | ICD-10-CM | POA: Diagnosis not present

## 2021-08-16 DIAGNOSIS — D513 Other dietary vitamin B12 deficiency anemia: Secondary | ICD-10-CM

## 2021-08-16 DIAGNOSIS — E1140 Type 2 diabetes mellitus with diabetic neuropathy, unspecified: Secondary | ICD-10-CM | POA: Diagnosis not present

## 2021-08-16 DIAGNOSIS — E559 Vitamin D deficiency, unspecified: Secondary | ICD-10-CM | POA: Diagnosis not present

## 2021-08-16 LAB — COMPREHENSIVE METABOLIC PANEL
ALT: 23 U/L (ref 0–53)
AST: 39 U/L — ABNORMAL HIGH (ref 0–37)
Albumin: 4.3 g/dL (ref 3.5–5.2)
Alkaline Phosphatase: 48 U/L (ref 39–117)
BUN: 32 mg/dL — ABNORMAL HIGH (ref 6–23)
CO2: 25 mEq/L (ref 19–32)
Calcium: 9.2 mg/dL (ref 8.4–10.5)
Chloride: 104 mEq/L (ref 96–112)
Creatinine, Ser: 1.39 mg/dL (ref 0.40–1.50)
GFR: 46.99 mL/min — ABNORMAL LOW (ref >60.00)
Glucose, Bld: 118 mg/dL — ABNORMAL HIGH (ref 70–99)
Potassium: 3.9 mEq/L (ref 3.5–5.1)
Sodium: 138 mEq/L (ref 135–145)
Total Bilirubin: 0.5 mg/dL (ref 0.2–1.2)
Total Protein: 7.1 g/dL (ref 6.0–8.3)

## 2021-08-16 LAB — VITAMIN B12: Vitamin B-12: 671 pg/mL (ref 211–911)

## 2021-08-16 LAB — URINALYSIS, ROUTINE W REFLEX MICROSCOPIC
Bilirubin Urine: NEGATIVE
Hgb urine dipstick: NEGATIVE
Ketones, ur: NEGATIVE
Leukocytes,Ua: NEGATIVE
Nitrite: NEGATIVE
RBC / HPF: NONE SEEN (ref 0–?)
Specific Gravity, Urine: 1.015 (ref 1.000–1.030)
Total Protein, Urine: NEGATIVE
Urine Glucose: NEGATIVE
Urobilinogen, UA: 0.2 (ref 0.0–1.0)
WBC, UA: NONE SEEN (ref 0–?)
pH: 5.5 (ref 5.0–8.0)

## 2021-08-16 LAB — CBC
HCT: 33.7 % — ABNORMAL LOW (ref 39.0–52.0)
Hemoglobin: 11 g/dL — ABNORMAL LOW (ref 13.0–17.0)
MCHC: 32.7 g/dL (ref 30.0–36.0)
MCV: 89.2 fl (ref 78.0–100.0)
Platelets: 223 10*3/uL (ref 150.0–400.0)
RBC: 3.78 Mil/uL — ABNORMAL LOW (ref 4.22–5.81)
RDW: 14.8 % (ref 11.5–15.5)
WBC: 5.6 10*3/uL (ref 4.0–10.5)

## 2021-08-16 LAB — VITAMIN D 25 HYDROXY (VIT D DEFICIENCY, FRACTURES): VITD: 76.98 ng/mL (ref 30.00–100.00)

## 2021-08-16 LAB — HEMOGLOBIN A1C: Hgb A1c MFr Bld: 6.7 % — ABNORMAL HIGH (ref 4.6–6.5)

## 2021-08-16 LAB — FERRITIN: Ferritin: 9.1 ng/mL — ABNORMAL LOW (ref 22.0–322.0)

## 2021-08-16 LAB — TSH: TSH: 1.37 u[IU]/mL (ref 0.35–5.50)

## 2021-08-16 NOTE — Patient Outreach (Signed)
  Care Coordination   08/16/2021 Name: James Juarez MRN: [id number] DOB: August 01, 1940   Care Coordination Outreach Attempts:  An unsuccessful telephone outreach was attempted today to offer the patient information about available care coordination services as a benefit of their health plan.   Follow Up Plan:  Additional outreach attempts will be made to offer the patient care coordination information and services.   Encounter Outcome:  No Answer  Care Coordination Interventions Activated:  No   Care Coordination Interventions:  No, not indicated    Raina Mina, RN Care Management Coordinator [place] Office [phone]

## 2021-08-18 ENCOUNTER — Ambulatory Visit: Payer: Medicare PPO | Admitting: Endocrinology

## 2021-08-18 ENCOUNTER — Encounter: Payer: Self-pay | Admitting: Endocrinology

## 2021-08-18 VITALS — BP 124/64 | HR 71 | Ht 70.0 in | Wt 164.6 lb

## 2021-08-18 DIAGNOSIS — E7800 Pure hypercholesterolemia, unspecified: Secondary | ICD-10-CM | POA: Diagnosis not present

## 2021-08-18 DIAGNOSIS — E119 Type 2 diabetes mellitus without complications: Secondary | ICD-10-CM | POA: Diagnosis not present

## 2021-08-18 DIAGNOSIS — E063 Autoimmune thyroiditis: Secondary | ICD-10-CM | POA: Diagnosis not present

## 2021-08-18 DIAGNOSIS — D508 Other iron deficiency anemias: Secondary | ICD-10-CM

## 2021-08-18 NOTE — Progress Notes (Signed)
Patient ID: James Juarez, male   DOB: [birth year], 81 y.o.   MRN: [id number]   Reason for Appointment: follow-up of various problems  History of Present Illness   Diagnosis: Type 2 DIABETES MELITUS   Oral hypoglycemic drugs: Glipizide ER 5 mg, Actos 30 mg, and Janumet 50/1000 twice a day   He has had long-standing diabetes which has been well-controlled with the same 4 drug regimen which has been unchanged for several years  A1c is 6.7 Lowest reading 5.9 as of 9/18  He is losing weight likely to be from smaller portions He has small amount of carbohydrate and usually eggs at breakfast and lunch both Has not been getting into sweets frequently lately He says he still walking 5 days a week Has not done much monitoring after meals at least recently Fasting readings are similar to the last visit Lab glucose was 118 fasting No hypoglycemic symptoms during the day   GLUCOSE monitoring:  Glucometer:  Accu-Chek Aviva  Blood sugars from monitor reviewed as follows  Recently for readings from monitor range from 106-129, average 119 Previously  PRE-MEAL Fasting Lunch Dinner Bedtime Overall  Glucose range: 112-141   100-189   Mean/median:     136   POST-MEAL PC Breakfast PC Lunch PC Dinner  Glucose range:  155   Mean/median:       Side effects from medications: None       Diet: Generally low fat and small portions        Physical activity: exercise: Walking 20 min, 4-6/7 days a week              Wt Readings from Last 3 Encounters:  08/18/21 164 lb 9.6 oz (74.7 kg)  01/27/21 172 lb 9.6 oz (78.3 kg)  11/05/20 176 lb (79.8 kg)     He has several other active problems which are discussed in review of systems   LABS:  Lab Results  Component Value Date   HGBA1C 6.7 (H) 08/16/2021   HGBA1C 6.8 (H) 01/24/2021   HGBA1C 6.5 07/19/2020   Lab Results  Component Value Date   MICROALBUR 1.2 01/24/2021   LDLCALC 80 01/24/2021   CREATININE 1.39 08/16/2021     Lab  on 08/16/2021  Component Date Value Ref Range Status   TSH 08/16/2021 1.37  0.35 - 5.50 uIU/mL Final   Sodium 08/16/2021 138  135 - 145 mEq/L Final   Potassium 08/16/2021 3.9  3.5 - 5.1 mEq/L Final   Chloride 08/16/2021 104  96 - 112 mEq/L Final   CO2 08/16/2021 25  19 - 32 mEq/L Final   Glucose, Bld 08/16/2021 118 (H)  70 - 99 mg/dL Final   BUN 08/16/2021 32 (H)  6 - 23 mg/dL Final   Creatinine, Ser 08/16/2021 1.39  0.40 - 1.50 mg/dL Final   Total Bilirubin 08/16/2021 0.5  0.2 - 1.2 mg/dL Final   Alkaline Phosphatase 08/16/2021 48  39 - 117 U/L Final   AST 08/16/2021 39 (H)  0 - 37 U/L Final   ALT 08/16/2021 23  0 - 53 U/L Final   Total Protein 08/16/2021 7.1  6.0 - 8.3 g/dL Final   Albumin 08/16/2021 4.3  3.5 - 5.2 g/dL Final   GFR 08/16/2021 46.99 (L)  >60.00 mL/min Final   Calculated using the CKD-EPI Creatinine Equation (2021)   Calcium 08/16/2021 9.2  8.4 - 10.5 mg/dL Final   Ferritin 08/16/2021 9.1 (L)  22.0 - 322.0 ng/mL Final  Color, Urine 08/16/2021 YELLOW  Yellow;Lt. Yellow;Straw;Dark Yellow;Amber;Green;Red;Brown Final   APPearance 08/16/2021 CLEAR  Clear;Turbid;Slightly Cloudy;Cloudy Final   Specific Gravity, Urine 08/16/2021 1.015  1.000 - 1.030 Final   pH 08/16/2021 5.5  5.0 - 8.0 Final   Total Protein, Urine 08/16/2021 NEGATIVE  Negative Final   Urine Glucose 08/16/2021 NEGATIVE  Negative Final   Ketones, ur 08/16/2021 NEGATIVE  Negative Final   Bilirubin Urine 08/16/2021 NEGATIVE  Negative Final   Hgb urine dipstick 08/16/2021 NEGATIVE  Negative Final   Urobilinogen, UA 08/16/2021 0.2  0.0 - 1.0 Final   Leukocytes,Ua 08/16/2021 NEGATIVE  Negative Final   Nitrite 08/16/2021 NEGATIVE  Negative Final   WBC, UA 08/16/2021 none seen  0-2/hpf Final   RBC / HPF 08/16/2021 none seen  0-2/hpf Final   WBC 08/16/2021 5.6  4.0 - 10.5 K/uL Final   RBC 08/16/2021 3.78 (L)  4.22 - 5.81 Mil/uL Final   Platelets 08/16/2021 223.0  150.0 - 400.0 K/uL Final   Hemoglobin 08/16/2021  11.0 (L)  13.0 - 17.0 g/dL Final   HCT 08/16/2021 33.7 (L)  39.0 - 52.0 % Final   MCV 08/16/2021 89.2  78.0 - 100.0 fl Final   MCHC 08/16/2021 32.7  30.0 - 36.0 g/dL Final   RDW 08/16/2021 14.8  11.5 - 15.5 % Final   VITD 08/16/2021 76.98  30.00 - 100.00 ng/mL Final   Vitamin B-12 08/16/2021 671  211 - 911 pg/mL Final   Hgb A1c MFr Bld 08/16/2021 6.7 (H)  4.6 - 6.5 % Final   Glycemic Control Guidelines for People with Diabetes:Non Diabetic:  <6%Goal of Therapy: <7%Additional Action Suggested:  >8%     Allergies as of 08/18/2021   No Known Allergies      Medication List        Accurate as of August 18, 2021  1:09 PM. If you have any questions, ask your nurse or doctor.          Accu-Chek Aviva Plus w/Device Kit Use to check blood sugars 2 times daily, Dx Code E11.65   Accu-Chek FastClix Lancets Misc Use to check blood sugar 2 times per day dx code E11.65   Accu-Chek Guide test strip Generic drug: glucose blood USE TO CHECK BLOOD SUGAR ONCE DAILY   acetaminophen 325 MG tablet Commonly known as: TYLENOL Take 325 mg by mouth every 6 (six) hours as needed. For pain   albuterol 108 (90 Base) MCG/ACT inhaler Commonly known as: VENTOLIN HFA Inhale 2 puffs into the lungs every 6 (six) hours as needed. For shortness of breath   amLODipine 5 MG tablet Commonly known as: NORVASC TAKE 1 TABLET BY MOUTH EVERY DAY   cholecalciferol 25 MCG (1000 UNIT) tablet Commonly known as: VITAMIN D3 Take 1,000 Units by mouth 3 (three) times a week.   clobetasol 0.05 % Gel Commonly known as: TEMOVATE Apply 1 application topically daily as needed. For face.   dorzolamide-timolol 22.3-6.8 MG/ML ophthalmic solution Commonly known as: COSOPT   ferrous sulfate 325 (65 FE) MG EC tablet Take 325 mg by mouth daily with breakfast.   fexofenadine 180 MG tablet Commonly known as: ALLEGRA Take 180 mg by mouth daily as needed. For allergies   glipiZIDE 5 MG 24 hr tablet Commonly known as:  GLUCOTROL XL TAKE 1 TABLET BY MOUTH EVERY DAY   hydroxypropyl methylcellulose / hypromellose 2.5 % ophthalmic solution Commonly known as: ISOPTO TEARS / GONIOVISC Place 1 drop into both eyes at bedtime.   Janumet 50-1000  MG tablet Generic drug: sitaGLIPtin-metformin TAKE 1 TABLET BY MOUTH TWICE A DAY WITH MEALS   l-methylfolate-B6-B12 3-35-2 MG Tabs tablet Commonly known as: METANX Take 1 tablet by mouth 2 (two) times daily.   latanoprost 0.005 % ophthalmic solution Commonly known as: XALATAN SMARTSIG:1 Drop(s) In Eye(s) Every Evening   levothyroxine 75 MCG tablet Commonly known as: SYNTHROID TAKE 1 TABLET BY MOUTH EVERY DAY BEFORE BREAKFAST   multivitamin capsule Take 1 capsule by mouth daily.   pioglitazone 30 MG tablet Commonly known as: ACTOS TAKE 1 TABLET BY MOUTH EVERY DAY   rosuvastatin 10 MG tablet Commonly known as: CRESTOR Take 1 tablet (10 mg total) by mouth daily.   vitamin C 100 MG tablet Take 100 mg by mouth daily.        Allergies: No Known Allergies  Past Medical History:  Diagnosis Date   Asthma    Diabetes mellitus    Diverticular disease    Hypertension    Subdural hematoma [facility]) May 2013   bilateral     Past Surgical History:  Procedure Laterality Date   Georganna Skeans  05/22/2011   Procedure: Haskell Flirt;  Surgeon: Elaina Hoops, MD;  Location: [facility] NEURO ORS;  Service: Neurosurgery;  Laterality: Right;  Right [facility] for evacuation of subdural hematoma   BURR HOLE  05/23/2011   Procedure: Haskell Flirt;  Surgeon: Elaina Hoops, MD;  Location: [place] NEURO ORS;  Service: Neurosurgery;  Laterality: Left;  Left Georganna Skeans   CRANIOTOMY  06/14/2011   Procedure: CRANIOTOMY HEMATOMA EVACUATION SUBDURAL;  Surgeon: Elaina Hoops, MD;  Location: [place] NEURO ORS;  Service: Neurosurgery;  Laterality: N/A;  Left Craniotomy for subdural hematoma   CRANIOTOMY  06/23/2011   Procedure: CRANIOTOMY HEMATOMA EVACUATION SUBDURAL;  Surgeon: Elaina Hoops, MD;  Location: [place] NEURO ORS;   Service: Neurosurgery;  Laterality: Left;  Redo Craniotomy for Subdural Hematoma   HERNIA REPAIR     INGUINAL HERNIA REPAIR Left 09/27/2016   Procedure: LAPAROSCOPIC LEFT INGUINAL HERNIA REPAIR;  Surgeon: Clovis Riley, MD;  Location: [facility] ORS;  Service: General;  Laterality: Left;   INSERTION OF MESH Left 09/27/2016   Procedure: INSERTION OF MESH;  Surgeon: Clovis Riley, MD;  Location: [facility] ORS;  Service: General;  Laterality: Left;    Family History  Problem Relation Age of Onset   Diabetes Mother    Diabetes Father    Heart attack Father    Diabetes Sister    Diabetes Brother    Heart attack Brother     Social History:  reports that he quit smoking about 46 years ago. His smoking use included cigarettes. He has never used smokeless tobacco. He reports that he does not drink alcohol and does not use drugs.  Review of Systems:  Vitamin D deficiency:  he is taking unknown dose of supplements  Lab Results  Component Value Date   VD25OH 76.98 08/16/2021   VD25OH 93.75 01/24/2021   VD25OH 89.38 07/19/2020   VD25OH 97.52 01/19/2020     HYPERTENSION:  he has had hypertension for several years   He is on AMLODIPINE 5 mg daily   Previously on 50 mg losartan with good control but this was stopped because of creatinine of 1.6 in 12/18  He does monitor blood pressure at home, has been consistently normal   BP Readings from Last 3 Encounters:  08/18/21 124/64  01/27/21 132/62  11/05/20 130/72      RENAL dysfunction: His creatinine is  again mildly increased and about the same  No microalbuminuria  Lab Results  Component Value Date   CREATININE 1.39 08/16/2021   CREATININE 1.37 01/24/2021   CREATININE 1.32 07/19/2020    HYPERLIPIDEMIA: The lipid abnormality consists of elevated LDL, well controlled with 5 mg Crestor for several years LDL is below 100 consistently   Lab Results  Component Value Date   CHOL 157 01/24/2021   CHOL 174 07/19/2020   CHOL 165  01/19/2020   Lab Results  Component Value Date   HDL 62.70 01/24/2021   HDL 64.70 07/19/2020   HDL 63.10 01/19/2020   Lab Results  Component Value Date   LDLCALC 80 01/24/2021   [place] 95 07/19/2020   LDLCALC 85 01/19/2020   Lab Results  Component Value Date   TRIG 72.0 01/24/2021   TRIG 70.0 07/19/2020   TRIG 88.0 01/19/2020   Lab Results  Component Value Date   CHOLHDL 3 01/24/2021   CHOLHDL 3 07/19/2020   CHOLHDL 3 01/19/2020   No results found for: "LDLDIRECT"   HYPOTHYROIDISM: he has had long-standing mild hypothyroidism since 1995  His prescription has been 75 mcg dosage consistently He feels fairly good  He has been on 6-1/2 tablets a week of his levothyroxine with stable thyroid levels  Thyroid levels as follows:  Lab Results  Component Value Date   TSH 1.37 08/16/2021   TSH 1.92 01/24/2021   TSH 2.97 07/19/2020   FREET4 1.23 01/24/2021   FREET4 1.02 07/19/2020   FREET4 1.10 06/16/2019    NEUROPATHY: No change in the tightness in his distal feet relieved by exercising the toes Has insignificant numbness, no change in sensitivity/tingling in his feet  Did not try Metanx as recommended No difficulty with balance   ANEMIA: Patient has again requested evaluation of his labs for anemia  He has had persistent mild anemia which is multifactorial and has been given  Fusion iron by PCP before but this apparently caused hives  Ferritin consistently low Last colonoscopy 2017  Back on taking B12 supplement for pernicious anemia and level is normal  His hemoglobin is variable, slightly lower      Latest Ref Rng & Units 08/16/2021    8:43 AM 01/24/2021    9:04 AM 07/19/2020    8:52 AM  CBC  WBC 4.0 - 10.5 K/uL 5.6  6.3  5.9   Hemoglobin 13.0 - 17.0 g/dL 11.0  12.1  11.9   Hematocrit 39.0 - 52.0 % 33.7  37.2  36.0   Platelets 150.0 - 400.0 K/uL 223.0  224.0  277.0     Lab Results  Component Value Date   VITAMINB12 671 08/16/2021        Examination:   BP 124/64   Pulse 71   Ht _0  (1.778 m)   Wt 164 lb 9.6 oz (74.7 kg)   SpO2 99%   BMI 23.62 kg/m   Body mass index is 23.62 kg/m.    No lower leg edema present  ASSESSMENT/ PLAN:   Diabetes type 2 nonobese  See history of present illness for discussion of current diabetes management, blood sugar patterns and problems identified  He is on a consistent 4 drug regimen of glipizide, Actos and Janumet A1c is about the same at 6.7 Although he has not check readings after meals his level of control considering his age and duration of diabetes is still excellent  He has lost weight likely to be from reducing portions and calories especially at  lunch Consider increasing Actos for slightly better A1c and stabilizing weight  NEUROPATHY: He has very mild neuropathy with some mild sensory symptoms but no loss of sensation on exam  HYPERTENSION: Blood pressure is well controlled with amlodipine   Hypothyroidism: TSH is consistently again with Synthroid 75, 6-1/2 tablets a week He will continue the same dose   LIPIDS: Well-controlled, LDL consistently 100, to continue rosuvastatin 5 mg   Iron deficiency anemia: He will discuss trying a new preparation of iron with PCP     There are no Patient Instructions on file for this visit.   Elayne Snare 08/18/2021, 1:09 PM

## 2021-09-21 DIAGNOSIS — H04123 Dry eye syndrome of bilateral lacrimal glands: Secondary | ICD-10-CM | POA: Diagnosis not present

## 2021-09-21 DIAGNOSIS — H401211 Low-tension glaucoma, right eye, mild stage: Secondary | ICD-10-CM | POA: Diagnosis not present

## 2021-09-21 DIAGNOSIS — H40012 Open angle with borderline findings, low risk, left eye: Secondary | ICD-10-CM | POA: Diagnosis not present

## 2021-09-21 DIAGNOSIS — H25812 Combined forms of age-related cataract, left eye: Secondary | ICD-10-CM | POA: Diagnosis not present

## 2021-09-21 DIAGNOSIS — H524 Presbyopia: Secondary | ICD-10-CM | POA: Diagnosis not present

## 2021-09-26 DIAGNOSIS — Z23 Encounter for immunization: Secondary | ICD-10-CM | POA: Diagnosis not present

## 2021-10-13 ENCOUNTER — Encounter: Payer: Self-pay | Admitting: *Deleted

## 2021-10-13 ENCOUNTER — Other Ambulatory Visit: Payer: Self-pay | Admitting: Endocrinology

## 2021-10-13 ENCOUNTER — Telehealth: Payer: Self-pay | Admitting: *Deleted

## 2021-10-13 NOTE — Patient Outreach (Signed)
  Care Coordination   Initial Visit Note   10/13/2021 Name: James Juarez MRN: [id number] DOB: 1940-04-04  James Juarez is a 81 y.o. year old male who sees Koirala, Dibas, MD for primary care. I spoke with  James Juarez by phone today.  What matters to the patients health and wellness today?  Mucus issues and A.D information to MyChart    Goals Addressed               This Visit's Progress     COMPLETED: increase mucus unable to sleep/Requested info on A.D (pt-stated)        Care Coordination Interventions: Provided education to patient and/or caregiver about advanced directives Reviewed medications with patient and discussed Adherence to all prescribed medications with no needed refills. Collaborated with provider regarding increase mucus build up and requested intervention Reviewed scheduled/upcoming provider appointments including pending appointments and pt has indicated he has a pending appointment for Jan 2024 for AWV. Screening for signs and symptoms of depression related to chronic disease state  Assessed social determinant of health barriers Will add the requested information to Mychart on Advance Directive.        SDOH assessments and interventions completed:  Yes  SDOH Interventions Today    Flowsheet Row Most Recent Value  SDOH Interventions   Food Insecurity Interventions Intervention Not Indicated  Housing Interventions Intervention Not Indicated  Transportation Interventions Intervention Not Indicated  Utilities Interventions Intervention Not Indicated        Care Coordination Interventions Activated:  Yes  Care Coordination Interventions:  Yes, provided   Follow up plan: No further intervention required.   Encounter Outcome:  Pt. Visit Completed   Raina Mina, RN Care Management Coordinator [place] Office [phone]

## 2021-10-13 NOTE — Patient Instructions (Signed)
Visit Information  Thank you for taking time to visit with me today. Please don't hesitate to contact me if I can be of assistance to you.   Following are the goals we discussed today:   Goals Addressed               This Visit's Progress     COMPLETED: increase mucus unable to sleep/Requested info on A.D (pt-stated)        Care Coordination Interventions: Provided education to patient and/or caregiver about advanced directives Reviewed medications with patient and discussed Adherence to all prescribed medications with no needed refills. Collaborated with provider regarding increase mucus build up and requested intervention Reviewed scheduled/upcoming provider appointments including pending appointments and pt has indicated he has a pending appointment for Jan 2024 for AWV. Screening for signs and symptoms of depression related to chronic disease state  Assessed social determinant of health barriers Will add the requested information to Mychart on Advance Directive.        Please call the care guide team at [phone] if you need to cancel or reschedule your appointment.   If you are experiencing a Mental Health or [place] or need someone to talk to, please call the Suicide and Crisis Lifeline: 988 call the [place] National Suicide Prevention Lifeline: 8147967415 or TTY: (928)442-8605 TTY 404-329-7589) to talk to a trained counselor call 1-800-273-TALK (toll free, 24 hour hotline)  Patient verbalizes understanding of instructions and care plan provided today and agrees to view in [place]. Active MyChart status and patient understanding of how to access instructions and care plan via MyChart confirmed with patient.     No further follow up required: No further needs  Raina Mina, RN Care Management Coordinator [place] Office [phone]

## 2021-10-27 ENCOUNTER — Ambulatory Visit: Payer: Medicare PPO | Admitting: Cardiology

## 2021-10-27 ENCOUNTER — Encounter: Payer: Self-pay | Admitting: Cardiology

## 2021-10-27 VITALS — BP 128/71 | HR 72 | Temp 97.3°F | Resp 16 | Ht 67.0 in | Wt 166.4 lb

## 2021-10-27 DIAGNOSIS — E1122 Type 2 diabetes mellitus with diabetic chronic kidney disease: Secondary | ICD-10-CM | POA: Diagnosis not present

## 2021-10-27 DIAGNOSIS — E7800 Pure hypercholesterolemia, unspecified: Secondary | ICD-10-CM

## 2021-10-27 DIAGNOSIS — Z794 Long term (current) use of insulin: Secondary | ICD-10-CM | POA: Diagnosis not present

## 2021-10-27 DIAGNOSIS — N1831 Chronic kidney disease, stage 3a: Secondary | ICD-10-CM | POA: Diagnosis not present

## 2021-10-27 DIAGNOSIS — I129 Hypertensive chronic kidney disease with stage 1 through stage 4 chronic kidney disease, or unspecified chronic kidney disease: Secondary | ICD-10-CM | POA: Diagnosis not present

## 2021-10-27 DIAGNOSIS — I10 Essential (primary) hypertension: Secondary | ICD-10-CM

## 2021-10-27 NOTE — Progress Notes (Signed)
Primary Physician/Referring:  Lujean Amel, MD  Patient ID: James Juarez, male    DOB: 1940/09/23, 81 y.o.   MRN: [id number]  Chief Complaint  Patient presents with   Hypertension   Hyperlipidemia   Follow-up    1 year   HPI:    James Juarez  is a 81 y.o. Asian [place] male with history of diabetes mellitus, controlled, hypertension and hyperlipidemia.  He was evaluated for subdural hematoma in May 2013, etiology could not be found.   He presents for routine follow-up for hypertension, hyperlipidemia. No new symptoms.  He continues to walk at least 30 minutes on a daily basis.  He remains asymptomatic and likes to see me on an annual basis.  His diabetes is being managed by Dr. Dwyane Dee.  Past Medical History:  Diagnosis Date   Asthma    Diabetes mellitus    Diverticular disease    Hypertension    Subdural hematoma [facility]) May 2013   bilateral    Social History   Tobacco Use   Smoking status: Former    Packs/day: 1.00    Years: 15.00    Total pack years: 15.00    Types: Cigarettes    Quit date: 45    Years since quitting: 46.8   Smokeless tobacco: Never  Substance Use Topics   Alcohol use: No   Marital Status: Married  ROS  Review of Systems  Cardiovascular:  Negative for chest pain, dyspnea on exertion and leg swelling.  Gastrointestinal:  Negative for melena.   Objective  Blood pressure 128/71, pulse 72, temperature (!) 97.3 F (36.3 C), temperature source Temporal, resp. rate 16, height _0  (1.702 m), weight 166 lb 6.4 oz (75.5 kg), SpO2 99 %.     10/27/2021    1:00 PM 08/18/2021    1:06 PM 01/27/2021    1:09 PM  Vitals with BMI  Height _1  _2  _3   Weight 166 lbs 6 oz 164 lbs 10 oz 172 lbs 10 oz  BMI 26.06 53.64 68.03  Systolic 212 248 250  Diastolic 71 64 62  Pulse 72 71 79     Physical Exam Neck:     Vascular: No carotid bruit or JVD.  Cardiovascular:     Rate and Rhythm: Normal rate and regular rhythm.     Pulses: Intact distal pulses.      Heart sounds: Normal heart sounds. No murmur heard.    No gallop.  Pulmonary:     Effort: Pulmonary effort is normal.     Breath sounds: Normal breath sounds.  Abdominal:     General: Bowel sounds are normal.     Palpations: Abdomen is soft.  Musculoskeletal:     Right lower leg: No edema.     Left lower leg: No edema.    Laboratory examination:   Lab Results  Component Value Date   NA 138 08/16/2021   K 3.9 08/16/2021   CO2 25 08/16/2021   GLUCOSE 118 (H) 08/16/2021   BUN 32 (H) 08/16/2021   CREATININE 1.39 08/16/2021   CALCIUM 9.2 08/16/2021   GFRNONAA 44 (L) 09/13/2016    Lab Results  Component Value Date   ALT 23 08/16/2021   AST 39 (H) 08/16/2021   ALKPHOS 48 08/16/2021   BILITOT 0.5 08/16/2021       Latest Ref Rng & Units 08/16/2021    8:43 AM 01/24/2021    9:04 AM 07/19/2020    8:52 AM  CBC  WBC 4.0 - 10.5 K/uL 5.6  6.3  5.9   Hemoglobin 13.0 - 17.0 g/dL 11.0  12.1  11.9   Hematocrit 39.0 - 52.0 % 33.7  37.2  36.0   Platelets 150.0 - 400.0 K/uL 223.0  224.0  277.0    Lipid Panel Recent Labs    01/24/21 0904  CHOL 157  TRIG 72.0  LDLCALC 80  VLDL 14.4  HDL 62.70  CHOLHDL 3   HEMOGLOBIN A1C Lab Results  Component Value Date   HGBA1C 6.7 (H) 08/16/2021   MPG 122.63 09/13/2016   TSH Recent Labs    01/24/21 0904 08/16/21 0843  TSH 1.92 1.37    Medications and allergies  No Known Allergies   Current Outpatient Medications:    ACCU-CHEK FASTCLIX LANCETS MISC, Use to check blood sugar 2 times per day dx code E11.65, Disp: 102 each, Rfl: 3   acetaminophen (TYLENOL) 325 MG tablet, Take 325 mg by mouth every 6 (six) hours as needed. For pain, Disp: , Rfl:    albuterol (PROVENTIL HFA;VENTOLIN HFA) 108 (90 BASE) MCG/ACT inhaler, Inhale 2 puffs into the lungs every 6 (six) hours as needed. For shortness of breath, Disp: , Rfl:    amLODipine (NORVASC) 5 MG tablet, TAKE 1 TABLET BY MOUTH EVERY DAY, Disp: 90 tablet, Rfl: 1   Ascorbic Acid (VITAMIN  C) 100 MG tablet, Take 100 mg by mouth daily., Disp: , Rfl:    Blood Glucose Monitoring Suppl (ACCU-CHEK AVIVA PLUS) w/Device KIT, Use to check blood sugars 2 times daily, Dx Code E11.65, Disp: 1 kit, Rfl: 1   cholecalciferol (VITAMIN D3) 25 MCG (1000 UNIT) tablet, Take 1,000 Units by mouth 3 (three) times a week., Disp: , Rfl:    clobetasol (TEMOVATE) 0.05 % GEL, Apply 1 application topically daily as needed. For face., Disp: , Rfl:    dorzolamide-timolol (COSOPT) 22.3-6.8 MG/ML ophthalmic solution, , Disp: , Rfl:    fexofenadine (ALLEGRA) 180 MG tablet, Take 180 mg by mouth daily as needed. For allergies, Disp: , Rfl:    glipiZIDE (GLUCOTROL XL) 5 MG 24 hr tablet, TAKE 1 TABLET BY MOUTH EVERY DAY, Disp: 90 tablet, Rfl: 1   glucose blood (ACCU-CHEK GUIDE) test strip, USE TO CHECK BLOOD SUGAR ONCE DAILY, Disp: 100 strip, Rfl: 12   hydroxypropyl methylcellulose (ISOPTO TEARS) 2.5 % ophthalmic solution, Place 1 drop into both eyes at bedtime. , Disp: , Rfl:    JANUMET 50-1000 MG tablet, TAKE 1 TABLET BY MOUTH TWICE A DAY WITH MEALS, Disp: 180 tablet, Rfl: 1   l-methylfolate-B6-B12 (METANX) 3-35-2 MG TABS tablet, Take 1 tablet by mouth 2 (two) times daily., Disp: 180 tablet, Rfl: 5   latanoprost (XALATAN) 0.005 % ophthalmic solution, SMARTSIG:1 Drop(s) In Eye(s) Every Evening, Disp: , Rfl:    levothyroxine (SYNTHROID) 75 MCG tablet, TAKE 1 TABLET BY MOUTH EVERY DAY BEFORE BREAKFAST, Disp: 90 tablet, Rfl: 1   Multiple Vitamin (MULTIVITAMIN) capsule, Take 1 capsule by mouth daily., Disp: , Rfl:    pioglitazone (ACTOS) 30 MG tablet, TAKE 1 TABLET BY MOUTH EVERY DAY, Disp: 90 tablet, Rfl: 0   rosuvastatin (CRESTOR) 10 MG tablet, Take 1 tablet (10 mg total) by mouth daily., Disp: 90 tablet, Rfl: 4   Radiology:   No results found.  Cardiac Studies:   AAA screening: 02/07/10: No AAA. normal aorta. Mild plaque iliac arteries.  Treadmill Exercise Stress 10/15/2019: Normal treadmill exercise stress  test. Resting ECG demonstrated normal sinus rhythm. Peak ECG demonstrated no ST-T  wave abnormalities. The patient exercised for 6 minutes and 1 seconds on Bruce protocol; achieved 7.05 METs at 108% of maximum predicted heart rate. Chest pain is not present. The heart rate response was accelerated. The blood pressure response was normal. Continue primary/secondary prevention  Echocardiogram 10/09/2019: Normal LV systolic function with visual EF 55-60%. Left ventricle cavity is normal in size. Normal global wall motion. Indeterminate diastolic filling pattern, normal LAP. Mild left ventricular hypertrophy. Calculated EF 56%. Left atrial cavity is mildly dilated. No significant valvular abnormalities. IVC is dilated with a respiratory response of >50%. No prior study for comparison.  Carotid artery duplex  10/09/2019: The bifurcation, internal, external and common carotid arteries reveal no evidence of significant stenosis, bilaterally. Antegrade right vertebral artery flow. Antegrade left vertebral artery flow.  EKG  EKG 10/27/2021: Normal sinus rhythm at rate of 68 bpm.  Normal ECG.  Assessment     ICD-10-CM   1. Primary hypertension  I10 EKG 12-Lead    2. Hypercholesteremia  E78.00     3. Pure hypercholesterolemia  E78.00        Medications Discontinued During This Encounter  Medication Reason   ferrous sulfate 325 (65 FE) MG EC tablet Allergic reaction    No orders of the defined types were placed in this encounter.  Recommendations:   James Juarez is a 81 y.o. [place] male with history of diabetes mellitus, controlled, hypertension and hyperlipidemia.  He was evaluated for subdural hematoma in May 2013, etiology could not be found.  He presents for annual visit, essentially remains asymptomatic and likes to see me on an annual basis.  I reviewed his external labs, all his labs are within stable range, LDL is >70 and in view of diabetes mellitus, although he does  not have any vascular disease, could consider addition of Zetia or increasing the dose of Crestor to 20 mg daily.  However this is an option only.  Similarly his blood pressure is very well controlled with amlodipine 5 mg daily.  We could consider reducing the dose to 2.5 mg and add losartan 25 mg in the evening for cardiovascular protection and renal protection in view of stage IIIa chronic kidney disease and diabetes mellitus.  I have discussed with him regarding his renal failure, and this is a suggestion only again according to the guidelines but overall over the past 2 to 3 years he has remained very stable and hence could potentially continue present medical management as well.  He will discuss his options with his PCP Dr. Dorthy Cooler and with Dr. Elayne Snare before making any discharge.  He wants to come back and see me again in a year.    Adrian Prows, MD, [facility] 10/27/2021, 1:53 PM Office: [phone] Fax: [phone] Pager: [phone]

## 2021-11-04 ENCOUNTER — Ambulatory Visit: Payer: Medicare PPO | Admitting: Cardiology

## 2021-11-23 DIAGNOSIS — E039 Hypothyroidism, unspecified: Secondary | ICD-10-CM | POA: Diagnosis not present

## 2021-11-23 DIAGNOSIS — I10 Essential (primary) hypertension: Secondary | ICD-10-CM | POA: Diagnosis not present

## 2021-11-23 DIAGNOSIS — E1122 Type 2 diabetes mellitus with diabetic chronic kidney disease: Secondary | ICD-10-CM | POA: Diagnosis not present

## 2021-12-05 ENCOUNTER — Other Ambulatory Visit: Payer: Self-pay | Admitting: Endocrinology

## 2021-12-05 ENCOUNTER — Other Ambulatory Visit: Payer: Self-pay | Admitting: Cardiology

## 2021-12-05 DIAGNOSIS — E7800 Pure hypercholesterolemia, unspecified: Secondary | ICD-10-CM

## 2021-12-07 DIAGNOSIS — E039 Hypothyroidism, unspecified: Secondary | ICD-10-CM | POA: Diagnosis not present

## 2021-12-19 IMAGING — CR DG CHEST 2V
2 series · 2 of 2 positions shown · non-contrast
Comparison: July 19, 2016.

CLINICAL DATA: Dyspnea.

EXAM:
CHEST - 2 VIEW

[w chest pa]
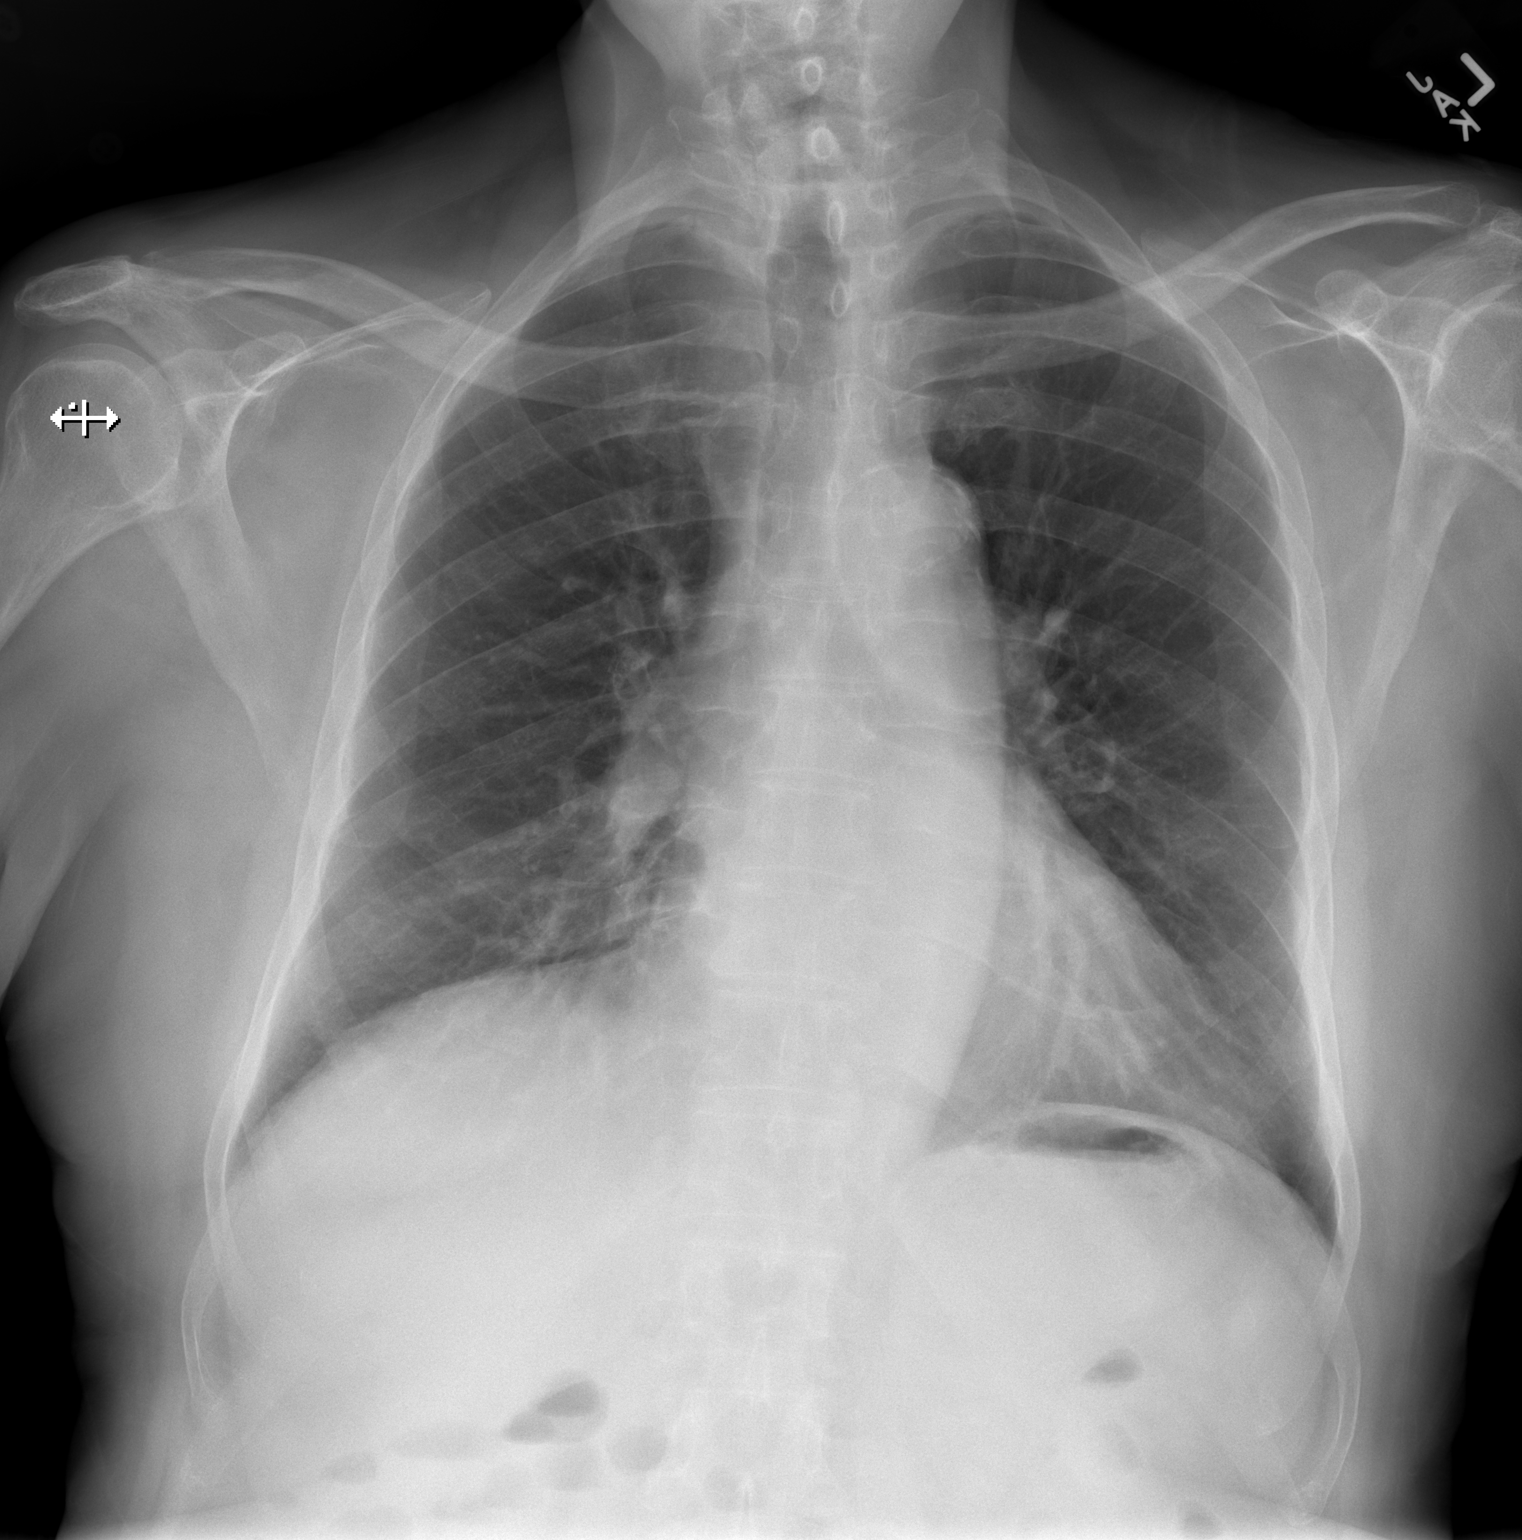

[w chest lat]
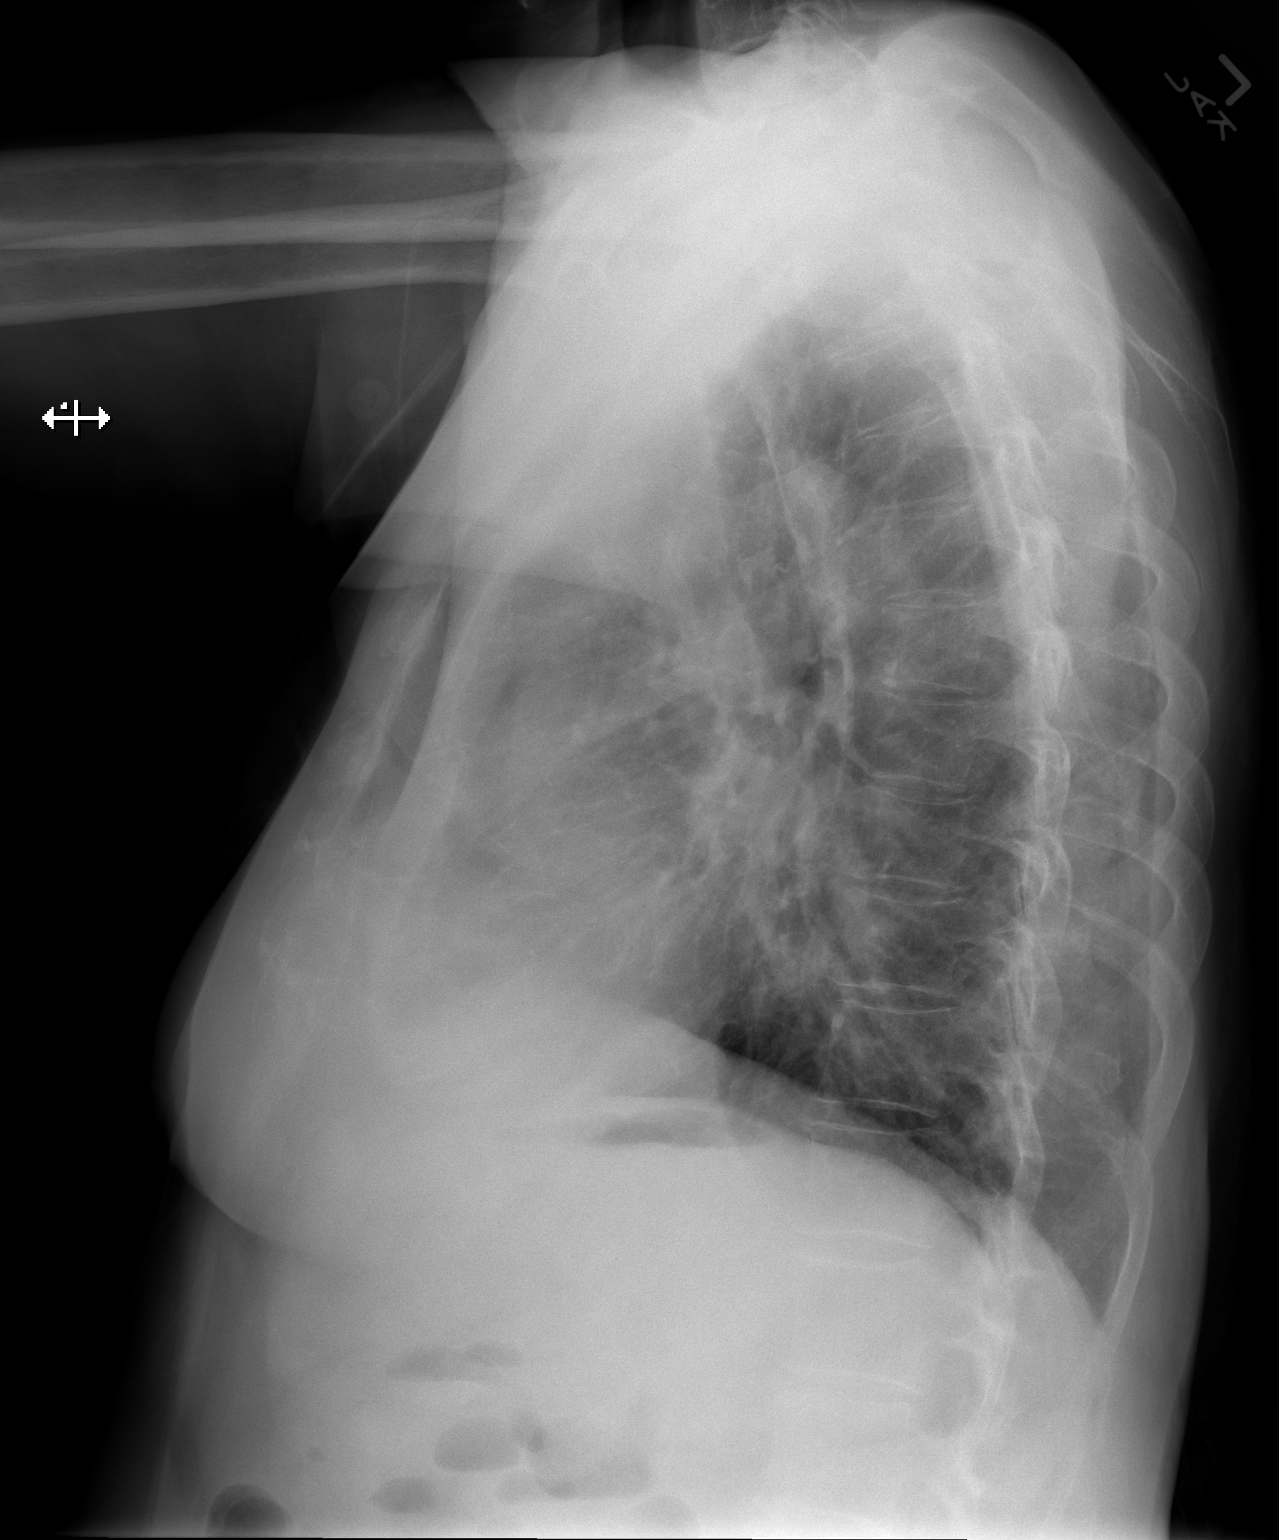

[2 of 2 positions shown; findings below may reference images not displayed]

FINDINGS: The heart size and mediastinal contours are within normal limits.
Both lungs are clear. No pneumothorax or pleural effusion is noted.
The visualized skeletal structures are unremarkable.
IMPRESSION: No active cardiopulmonary disease.

## 2021-12-29 DIAGNOSIS — R0989 Other specified symptoms and signs involving the circulatory and respiratory systems: Secondary | ICD-10-CM | POA: Diagnosis not present

## 2022-03-05 ENCOUNTER — Other Ambulatory Visit: Payer: Self-pay | Admitting: Endocrinology

## 2022-03-24 ENCOUNTER — Other Ambulatory Visit: Payer: Self-pay | Admitting: Endocrinology

## 2022-03-24 ENCOUNTER — Other Ambulatory Visit (INDEPENDENT_AMBULATORY_CARE_PROVIDER_SITE_OTHER): Payer: Medicare PPO

## 2022-03-24 DIAGNOSIS — E7800 Pure hypercholesterolemia, unspecified: Secondary | ICD-10-CM | POA: Diagnosis not present

## 2022-03-24 DIAGNOSIS — D508 Other iron deficiency anemias: Secondary | ICD-10-CM | POA: Diagnosis not present

## 2022-03-24 DIAGNOSIS — D513 Other dietary vitamin B12 deficiency anemia: Secondary | ICD-10-CM

## 2022-03-24 DIAGNOSIS — E063 Autoimmune thyroiditis: Secondary | ICD-10-CM

## 2022-03-24 DIAGNOSIS — E119 Type 2 diabetes mellitus without complications: Secondary | ICD-10-CM | POA: Diagnosis not present

## 2022-03-24 DIAGNOSIS — E559 Vitamin D deficiency, unspecified: Secondary | ICD-10-CM

## 2022-03-24 LAB — CBC
HCT: 32.8 % — ABNORMAL LOW (ref 39.0–52.0)
Hemoglobin: 10.8 g/dL — ABNORMAL LOW (ref 13.0–17.0)
MCHC: 32.9 g/dL (ref 30.0–36.0)
MCV: 91.7 fl (ref 78.0–100.0)
Platelets: 248 10*3/uL (ref 150.0–400.0)
RBC: 3.58 Mil/uL — ABNORMAL LOW (ref 4.22–5.81)
RDW: 14 % (ref 11.5–15.5)
WBC: 5.7 10*3/uL (ref 4.0–10.5)

## 2022-03-24 LAB — LIPID PANEL
Cholesterol: 147 mg/dL (ref 0–200)
HDL: 63.1 mg/dL (ref >39.00)
LDL Cholesterol: 70 mg/dL (ref 0–99)
NonHDL: 83.44
Total CHOL/HDL Ratio: 2
Triglycerides: 65 mg/dL (ref 0.0–149.0)
VLDL: 13 mg/dL (ref 0.0–40.0)

## 2022-03-24 LAB — MICROALBUMIN / CREATININE URINE RATIO
Creatinine,U: 119 mg/dL
Microalb Creat Ratio: 0.6 mg/g (ref 0.0–30.0)
Microalb, Ur: <0.7 mg/dL (ref 0.0–1.9)

## 2022-03-24 LAB — COMPREHENSIVE METABOLIC PANEL
ALT: 19 U/L (ref 0–53)
AST: 25 U/L (ref 0–37)
Albumin: 4 g/dL (ref 3.5–5.2)
Alkaline Phosphatase: 46 U/L (ref 39–117)
BUN: 28 mg/dL — ABNORMAL HIGH (ref 6–23)
CO2: 25 mEq/L (ref 19–32)
Calcium: 9.3 mg/dL (ref 8.4–10.5)
Chloride: 105 mEq/L (ref 96–112)
Creatinine, Ser: 1.32 mg/dL (ref 0.40–1.50)
GFR: 49.78 mL/min — ABNORMAL LOW (ref >60.00)
Glucose, Bld: 121 mg/dL — ABNORMAL HIGH (ref 70–99)
Potassium: 4.5 mEq/L (ref 3.5–5.1)
Sodium: 137 mEq/L (ref 135–145)
Total Bilirubin: 0.4 mg/dL (ref 0.2–1.2)
Total Protein: 7 g/dL (ref 6.0–8.3)

## 2022-03-24 LAB — VITAMIN D 25 HYDROXY (VIT D DEFICIENCY, FRACTURES): VITD: 73.62 ng/mL (ref 30.00–100.00)

## 2022-03-24 LAB — FERRITIN: Ferritin: 7.2 ng/mL — ABNORMAL LOW (ref 22.0–322.0)

## 2022-03-24 LAB — TSH: TSH: 1.11 u[IU]/mL (ref 0.35–5.50)

## 2022-03-24 LAB — VITAMIN B12: Vitamin B-12: 538 pg/mL (ref 211–911)

## 2022-03-24 LAB — HEMOGLOBIN A1C: Hgb A1c MFr Bld: 6.6 % — ABNORMAL HIGH (ref 4.6–6.5)

## 2022-03-27 ENCOUNTER — Ambulatory Visit: Payer: Medicare PPO | Admitting: Endocrinology

## 2022-03-27 ENCOUNTER — Encounter: Payer: Self-pay | Admitting: Endocrinology

## 2022-03-27 VITALS — BP 130/82 | HR 66 | Ht 67.0 in | Wt 167.6 lb

## 2022-03-27 DIAGNOSIS — E119 Type 2 diabetes mellitus without complications: Secondary | ICD-10-CM | POA: Diagnosis not present

## 2022-03-27 DIAGNOSIS — D513 Other dietary vitamin B12 deficiency anemia: Secondary | ICD-10-CM

## 2022-03-27 DIAGNOSIS — E7800 Pure hypercholesterolemia, unspecified: Secondary | ICD-10-CM

## 2022-03-27 DIAGNOSIS — D508 Other iron deficiency anemias: Secondary | ICD-10-CM

## 2022-03-27 DIAGNOSIS — I10 Essential (primary) hypertension: Secondary | ICD-10-CM | POA: Diagnosis not present

## 2022-03-27 NOTE — Progress Notes (Signed)
Patient ID: James Juarez, male   DOB: 1940-11-01, 82 y.o.   MRN: [id number]   Reason for Appointment: follow-up of various problems  History of Present Illness   Diagnosis: Type 2 DIABETES MELITUS   Oral hypoglycemic drugs: Glipizide ER 5 mg, Actos 30 mg, and Janumet 50/1000 twice a day   He has had long-standing diabetes which has been well-controlled with the same 4 drug regimen which has been unchanged for several years  A1c is 6.6 and stable Lowest reading 5.9 as of 9/18  He is continuing the same regimen of medications that he has had for years Overall continuing to maintain a relatively lower weight, BMI about 26 Although has not checked readings after meals very much his fasting readings are fairly consistent with highest about 140  No hypoglycemic symptoms during the daytime either  He continues to work even during winter months when he walks indoors Generally trying to watch his diet very well Weight likely to be from smaller portions On his previous visits blood sugars were relatively similar Lab glucose was 121   GLUCOSE monitoring:  Glucometer:  Accu-Chek Aviva  Blood sugars from monitor reviewed as follows Fasting 109-140 After meals 113, 139  Previously readings from monitor range from 106-129, average 119  Side effects from medications: None       Diet: Generally low fat and small portions        Physical activity: exercise: Walking up to 60 min, 4-6/7 days a week              Wt Readings from Last 3 Encounters:  03/27/22 167 lb 9.6 oz (76 kg)  10/27/21 166 lb 6.4 oz (75.5 kg)  08/18/21 164 lb 9.6 oz (74.7 kg)     He has several other active problems which are discussed in review of systems   LABS:  Lab Results  Component Value Date   HGBA1C 6.6 (H) 03/24/2022   HGBA1C 6.7 (H) 08/16/2021   HGBA1C 6.8 (H) 01/24/2021   Lab Results  Component Value Date   MICROALBUR <0.7 03/24/2022   LDLCALC 70 03/24/2022   CREATININE 1.32 03/24/2022      Lab on 03/24/2022  Component Date Value Ref Range Status   Vitamin B-12 03/24/2022 538  211 - 911 pg/mL Final   VITD 03/24/2022 73.62  30.00 - 100.00 ng/mL Final   Ferritin 03/24/2022 7.2 (L)  22.0 - 322.0 ng/mL Final   WBC 03/24/2022 5.7  4.0 - 10.5 K/uL Final   RBC 03/24/2022 3.58 (L)  4.22 - 5.81 Mil/uL Final   Platelets 03/24/2022 248.0  150.0 - 400.0 K/uL Final   Hemoglobin 03/24/2022 10.8 (L)  13.0 - 17.0 g/dL Final   HCT 03/24/2022 32.8 (L)  39.0 - 52.0 % Final   MCV 03/24/2022 91.7  78.0 - 100.0 fl Final   MCHC 03/24/2022 32.9  30.0 - 36.0 g/dL Final   RDW 03/24/2022 14.0  11.5 - 15.5 % Final   TSH 03/24/2022 1.11  0.35 - 5.50 uIU/mL Final   Microalb, Ur 03/24/2022 <0.7  0.0 - 1.9 mg/dL Final   Creatinine,U 03/24/2022 119.0  mg/dL Final   Microalb Creat Ratio 03/24/2022 0.6  0.0 - 30.0 mg/g Final   Cholesterol 03/24/2022 147  0 - 200 mg/dL Final   ATP III Classification       Desirable:  < 200 mg/dL               Borderline High:  200 -  239 mg/dL          High:  > = 240 mg/dL   Triglycerides 03/24/2022 65.0  0.0 - 149.0 mg/dL Final   Normal:  <150 mg/dLBorderline High:  150 - 199 mg/dL   HDL 03/24/2022 63.10  >39.00 mg/dL Final   VLDL 03/24/2022 13.0  0.0 - 40.0 mg/dL Final   LDL Cholesterol 03/24/2022 70  0 - 99 mg/dL Final   Total CHOL/HDL Ratio 03/24/2022 2   Final                  Men          Women1/2 Average Risk     3.4          3.3Average Risk          5.0          4.42X Average Risk          9.6          7.13X Average Risk          15.0          11.0                       NonHDL 03/24/2022 83.44   Final   NOTE:  Non-HDL goal should be 30 mg/dL higher than patient's LDL goal (i.e. LDL goal of < 70 mg/dL, would have non-HDL goal of < 100 mg/dL)   Sodium 03/24/2022 137  135 - 145 mEq/L Final   Potassium 03/24/2022 4.5  3.5 - 5.1 mEq/L Final   Chloride 03/24/2022 105  96 - 112 mEq/L Final   CO2 03/24/2022 25  19 - 32 mEq/L Final   Glucose, Bld 03/24/2022 121 (H)   70 - 99 mg/dL Final   BUN 03/24/2022 28 (H)  6 - 23 mg/dL Final   Creatinine, Ser 03/24/2022 1.32  0.40 - 1.50 mg/dL Final   Total Bilirubin 03/24/2022 0.4  0.2 - 1.2 mg/dL Final   Alkaline Phosphatase 03/24/2022 46  39 - 117 U/L Final   AST 03/24/2022 25  0 - 37 U/L Final   ALT 03/24/2022 19  0 - 53 U/L Final   Total Protein 03/24/2022 7.0  6.0 - 8.3 g/dL Final   Albumin 03/24/2022 4.0  3.5 - 5.2 g/dL Final   GFR 03/24/2022 49.78 (L)  >60.00 mL/min Final   Calculated using the CKD-EPI Creatinine Equation (2021)   Calcium 03/24/2022 9.3  8.4 - 10.5 mg/dL Final   Hgb A1c MFr Bld 03/24/2022 6.6 (H)  4.6 - 6.5 % Final   Glycemic Control Guidelines for People with Diabetes:Non Diabetic:  <6%Goal of Therapy: <7%Additional Action Suggested:  >8%     Allergies as of 03/27/2022   No Known Allergies      Medication List        Accurate as of March 27, 2022  1:32 PM. If you have any questions, ask your nurse or doctor.          Accu-Chek Aviva Plus w/Device Kit Use to check blood sugars 2 times daily, Dx Code E11.65   Accu-Chek FastClix Lancets Misc Use to check blood sugar 2 times per day dx code E11.65   Accu-Chek Guide test strip Generic drug: glucose blood USE TO CHECK BLOOD SUGAR ONCE DAILY   acetaminophen 325 MG tablet Commonly known as: TYLENOL Take 325 mg by mouth every 6 (six) hours as needed. For pain   albuterol 108 (90 Base)  MCG/ACT inhaler Commonly known as: VENTOLIN HFA Inhale 2 puffs into the lungs every 6 (six) hours as needed. For shortness of breath   amLODipine 5 MG tablet Commonly known as: NORVASC TAKE 1 TABLET BY MOUTH EVERY DAY   cholecalciferol 25 MCG (1000 UNIT) tablet Commonly known as: VITAMIN D3 Take 1,000 Units by mouth 3 (three) times a week.   clobetasol 0.05 % Gel Commonly known as: TEMOVATE Apply 1 application topically daily as needed. For face.   dorzolamide-timolol 2-0.5 % ophthalmic solution Commonly known as: COSOPT    fexofenadine 180 MG tablet Commonly known as: ALLEGRA Take 180 mg by mouth daily as needed. For allergies   glipiZIDE 5 MG 24 hr tablet Commonly known as: GLUCOTROL XL TAKE 1 TABLET BY MOUTH EVERY DAY   hydroxypropyl methylcellulose / hypromellose 2.5 % ophthalmic solution Commonly known as: ISOPTO TEARS / GONIOVISC Place 1 drop into both eyes at bedtime.   Janumet 50-1000 MG tablet Generic drug: sitaGLIPtin-metformin TAKE 1 TABLET BY MOUTH TWICE A DAY WITH MEALS   l-methylfolate-B6-B12 3-35-2 MG Tabs tablet Commonly known as: METANX Take 1 tablet by mouth 2 (two) times daily.   latanoprost 0.005 % ophthalmic solution Commonly known as: XALATAN SMARTSIG:1 Drop(s) In Eye(s) Every Evening   levothyroxine 75 MCG tablet Commonly known as: SYNTHROID TAKE 1 TABLET BY MOUTH EVERY DAY BEFORE BREAKFAST   losartan 25 MG tablet Commonly known as: COZAAR Take 25 mg by mouth daily.   multivitamin capsule Take 1 capsule by mouth daily.   pioglitazone 30 MG tablet Commonly known as: ACTOS TAKE 1 TABLET BY MOUTH EVERY DAY   rosuvastatin 10 MG tablet Commonly known as: CRESTOR TAKE 1 TABLET BY MOUTH EVERY DAY   vitamin C 100 MG tablet Take 100 mg by mouth daily.        Allergies: No Known Allergies  Past Medical History:  Diagnosis Date   Asthma    Diabetes mellitus    Diverticular disease    Hypertension    Subdural hematoma [facility]) May 2013   bilateral     Past Surgical History:  Procedure Laterality Date   Georganna Skeans  05/22/2011   Procedure: Haskell Flirt;  Surgeon: Elaina Hoops, MD;  Location: [facility] NEURO ORS;  Service: Neurosurgery;  Laterality: Right;  Right [facility] for evacuation of subdural hematoma   BURR HOLE  05/23/2011   Procedure: Haskell Flirt;  Surgeon: Elaina Hoops, MD;  Location: [place] NEURO ORS;  Service: Neurosurgery;  Laterality: Left;  Left Georganna Skeans   CRANIOTOMY  06/14/2011   Procedure: CRANIOTOMY HEMATOMA EVACUATION SUBDURAL;  Surgeon: Elaina Hoops, MD;   Location: [place] NEURO ORS;  Service: Neurosurgery;  Laterality: N/A;  Left Craniotomy for subdural hematoma   CRANIOTOMY  06/23/2011   Procedure: CRANIOTOMY HEMATOMA EVACUATION SUBDURAL;  Surgeon: Elaina Hoops, MD;  Location: [place] NEURO ORS;  Service: Neurosurgery;  Laterality: Left;  Redo Craniotomy for Subdural Hematoma   HERNIA REPAIR     INGUINAL HERNIA REPAIR Left 09/27/2016   Procedure: LAPAROSCOPIC LEFT INGUINAL HERNIA REPAIR;  Surgeon: Clovis Riley, MD;  Location: [facility] ORS;  Service: General;  Laterality: Left;   INSERTION OF MESH Left 09/27/2016   Procedure: INSERTION OF MESH;  Surgeon: Clovis Riley, MD;  Location: [facility] ORS;  Service: General;  Laterality: Left;    Family History  Problem Relation Age of Onset   Diabetes Mother    Diabetes Father    Heart attack Father    Diabetes  Sister    Diabetes Brother    Heart attack Brother     Social History:  reports that he quit smoking about 47 years ago. His smoking use included cigarettes. He has a 15.00 pack-year smoking history. He has never used smokeless tobacco. He reports that he does not drink alcohol and does not use drugs.  Review of Systems:  Vitamin D deficiency: Continues to be on vitamin D supplements with adequate levels  Lab Results  Component Value Date   VD25OH 73.62 03/24/2022   VD25OH 76.98 08/16/2021   VD25OH 93.75 01/24/2021   VD25OH 89.38 07/19/2020     HYPERTENSION:  he has had hypertension for several years   He is on AMLODIPINE 5 mg daily, his cardiologist has also added 25 mg losartan which she started about a month ago  Previously the dose of 50 mg losartan was stopped because of higher creatinine of 1.6 in 12/18 Creatinine appears to be similar now  He does monitor blood pressure at home also  BP Readings from Last 3 Encounters:  03/27/22 130/82  10/27/21 128/71  08/18/21 124/64      RENAL dysfunction: His creatinine is again mildly increased but generally in the same range  No  microalbuminuria  Lab Results  Component Value Date   CREATININE 1.32 03/24/2022   CREATININE 1.39 08/16/2021   CREATININE 1.37 01/24/2021    HYPERLIPIDEMIA: The lipid abnormality consists of elevated LDL, well controlled with 10 mg Crestor  The dose was increased by the cardiologist LDL is below 100 consistently   Lab Results  Component Value Date   CHOL 147 03/24/2022   CHOL 157 01/24/2021   CHOL 174 07/19/2020   Lab Results  Component Value Date   HDL 63.10 03/24/2022   HDL 62.70 01/24/2021   HDL 64.70 07/19/2020   Lab Results  Component Value Date   LDLCALC 70 03/24/2022   [place] 80 01/24/2021   [place] 95 07/19/2020   Lab Results  Component Value Date   TRIG 65.0 03/24/2022   TRIG 72.0 01/24/2021   TRIG 70.0 07/19/2020   Lab Results  Component Value Date   CHOLHDL 2 03/24/2022   CHOLHDL 3 01/24/2021   CHOLHDL 3 07/19/2020   No results found for: "LDLDIRECT"   HYPOTHYROIDISM: he has had long-standing mild hypothyroidism since 1995  His prescription has been 75 mcg dosage consistently He feels fairly good  He has been on 6-1/2 tablets a week of his levothyroxine with stable TSH levels  Thyroid levels as follows:  Lab Results  Component Value Date   TSH 1.11 03/24/2022   TSH 1.37 08/16/2021   TSH 1.92 01/24/2021   FREET4 1.23 01/24/2021   FREET4 1.02 07/19/2020   FREET4 1.10 06/16/2019    NEUROPATHY: Sometimes feels a tightness in his distal feet relieved by exercising the toes No definite numbness and objectively sensation is normal No difficulty with balance   ANEMIA: Patient has again requested evaluation of his labs for anemia  He has had persistent mild anemia which is multifactorial and has been given  Fusion iron by PCP before but this apparently caused hives, currently not on any supplement  Ferritin consistently low Last colonoscopy 2017  He is on taking B12 supplement for pernicious anemia and level is normal  His hemoglobin  is variable, slightly lower again      Latest Ref Rng & Units 03/24/2022    8:57 AM 08/16/2021    8:43 AM 01/24/2021    9:04 AM  CBC  WBC 4.0 - 10.5 K/uL 5.7  5.6  6.3   Hemoglobin 13.0 - 17.0 g/dL 10.8  11.0  12.1   Hematocrit 39.0 - 52.0 % 32.8  33.7  37.2   Platelets 150.0 - 400.0 K/uL 248.0  223.0  224.0     Lab Results  Component Value Date   VITAMINB12 538 03/24/2022       Examination:   BP 130/82 (BP Location: Left Arm, Patient Position: Sitting, Cuff Size: Normal)   Pulse 66   Ht 5\' 7"  (1.702 m)   Wt 167 lb 9.6 oz (76 kg)   SpO2 99%   BMI 26.25 kg/m   Body mass index is 26.25 kg/m.    Diabetic Foot Exam - Simple   Simple Foot Form Diabetic Foot exam was performed with the following findings: Yes   Visual Inspection No deformities, no ulcerations, no other skin breakdown bilaterally: Yes Sensation Testing Intact to touch and monofilament testing bilaterally: Yes Pulse Check Posterior Tibialis and Dorsalis pulse intact bilaterally: Yes Comments      ASSESSMENT/ PLAN:   Diabetes type 2 nonobese  See history of present illness for discussion of current diabetes management, blood sugar patterns and problems identified  He is on a consistent 4 drug regimen of glipizide, Actos and Janumet A1c is about the same at 6.6 He has maintained his weight and is generally very active with walking and maintaining his diet He will continue the same regimen but try to check more readings after meals  NEUROPATHY: He has very mild neuropathy with some mild sensory symptoms, foot exam normal  HYPERTENSION: Blood pressure is well controlled with amlodipine Also no change in creatinine with adding 25 mg losartan   Hypothyroidism: TSH is consistently again with Synthroid 75, 6-1/2 tablets a week He will continue the same dose   LIPIDS: Well-controlled, LDL closer to 70 with 10 mg dose of rosuvastatin, to continue rosuvastatin 10 mg   Iron deficiency anemia: He will  discuss trying a new preparation of iron with PCP or consider iron infusion, likely needs hematology consultation  Adequate levels of B12 and vitamin D     There are no Patient Instructions on file for this visit.   Elayne Snare 03/27/2022, 1:32 PM

## 2022-03-30 DIAGNOSIS — E1122 Type 2 diabetes mellitus with diabetic chronic kidney disease: Secondary | ICD-10-CM | POA: Diagnosis not present

## 2022-03-30 DIAGNOSIS — D509 Iron deficiency anemia, unspecified: Secondary | ICD-10-CM | POA: Diagnosis not present

## 2022-03-30 DIAGNOSIS — N1831 Chronic kidney disease, stage 3a: Secondary | ICD-10-CM | POA: Diagnosis not present

## 2022-03-30 DIAGNOSIS — I10 Essential (primary) hypertension: Secondary | ICD-10-CM | POA: Diagnosis not present

## 2022-04-01 ENCOUNTER — Other Ambulatory Visit: Payer: Self-pay | Admitting: Endocrinology

## 2022-04-02 ENCOUNTER — Other Ambulatory Visit: Payer: Self-pay | Admitting: Endocrinology

## 2022-04-09 ENCOUNTER — Other Ambulatory Visit: Payer: Self-pay | Admitting: Endocrinology

## 2022-04-11 DIAGNOSIS — H40012 Open angle with borderline findings, low risk, left eye: Secondary | ICD-10-CM | POA: Diagnosis not present

## 2022-04-11 DIAGNOSIS — H401211 Low-tension glaucoma, right eye, mild stage: Secondary | ICD-10-CM | POA: Diagnosis not present

## 2022-05-15 ENCOUNTER — Other Ambulatory Visit: Payer: Self-pay | Admitting: Endocrinology

## 2022-05-23 DIAGNOSIS — M25511 Pain in right shoulder: Secondary | ICD-10-CM | POA: Diagnosis not present

## 2022-05-23 DIAGNOSIS — M791 Myalgia, unspecified site: Secondary | ICD-10-CM | POA: Diagnosis not present

## 2022-05-23 DIAGNOSIS — M542 Cervicalgia: Secondary | ICD-10-CM | POA: Diagnosis not present

## 2022-06-12 DIAGNOSIS — N183 Chronic kidney disease, stage 3 unspecified: Secondary | ICD-10-CM | POA: Diagnosis not present

## 2022-06-12 DIAGNOSIS — E7800 Pure hypercholesterolemia, unspecified: Secondary | ICD-10-CM | POA: Diagnosis not present

## 2022-06-12 DIAGNOSIS — E1122 Type 2 diabetes mellitus with diabetic chronic kidney disease: Secondary | ICD-10-CM | POA: Diagnosis not present

## 2022-06-12 DIAGNOSIS — Z79899 Other long term (current) drug therapy: Secondary | ICD-10-CM | POA: Diagnosis not present

## 2022-06-12 DIAGNOSIS — Z0189 Encounter for other specified special examinations: Secondary | ICD-10-CM | POA: Diagnosis not present

## 2022-06-12 DIAGNOSIS — E039 Hypothyroidism, unspecified: Secondary | ICD-10-CM | POA: Diagnosis not present

## 2022-06-12 DIAGNOSIS — I10 Essential (primary) hypertension: Secondary | ICD-10-CM | POA: Diagnosis not present

## 2022-06-12 DIAGNOSIS — N1831 Chronic kidney disease, stage 3a: Secondary | ICD-10-CM | POA: Diagnosis not present

## 2022-06-12 DIAGNOSIS — D509 Iron deficiency anemia, unspecified: Secondary | ICD-10-CM | POA: Diagnosis not present

## 2022-06-15 DIAGNOSIS — Z0001 Encounter for general adult medical examination with abnormal findings: Secondary | ICD-10-CM | POA: Diagnosis not present

## 2022-06-15 DIAGNOSIS — E039 Hypothyroidism, unspecified: Secondary | ICD-10-CM | POA: Diagnosis not present

## 2022-06-15 DIAGNOSIS — E7800 Pure hypercholesterolemia, unspecified: Secondary | ICD-10-CM | POA: Diagnosis not present

## 2022-06-15 DIAGNOSIS — Z79899 Other long term (current) drug therapy: Secondary | ICD-10-CM | POA: Diagnosis not present

## 2022-06-15 DIAGNOSIS — N1831 Chronic kidney disease, stage 3a: Secondary | ICD-10-CM | POA: Diagnosis not present

## 2022-06-15 DIAGNOSIS — I10 Essential (primary) hypertension: Secondary | ICD-10-CM | POA: Diagnosis not present

## 2022-06-15 DIAGNOSIS — D509 Iron deficiency anemia, unspecified: Secondary | ICD-10-CM | POA: Diagnosis not present

## 2022-06-15 DIAGNOSIS — E1122 Type 2 diabetes mellitus with diabetic chronic kidney disease: Secondary | ICD-10-CM | POA: Diagnosis not present

## 2022-07-10 ENCOUNTER — Ambulatory Visit (INDEPENDENT_AMBULATORY_CARE_PROVIDER_SITE_OTHER): Payer: Medicare PPO

## 2022-07-10 ENCOUNTER — Encounter: Payer: Self-pay | Admitting: Podiatry

## 2022-07-10 ENCOUNTER — Ambulatory Visit: Payer: Medicare PPO | Admitting: Podiatry

## 2022-07-10 DIAGNOSIS — M21611 Bunion of right foot: Secondary | ICD-10-CM

## 2022-07-10 DIAGNOSIS — E1140 Type 2 diabetes mellitus with diabetic neuropathy, unspecified: Secondary | ICD-10-CM

## 2022-07-10 DIAGNOSIS — M7751 Other enthesopathy of right foot: Secondary | ICD-10-CM

## 2022-07-10 DIAGNOSIS — Q828 Other specified congenital malformations of skin: Secondary | ICD-10-CM

## 2022-07-10 DIAGNOSIS — E1149 Type 2 diabetes mellitus with other diabetic neurological complication: Secondary | ICD-10-CM

## 2022-07-10 MED ORDER — TRIAMCINOLONE ACETONIDE 10 MG/ML IJ SUSP
10.0000 mg | Freq: Once | INTRAMUSCULAR | Status: AC
Start: 2022-07-10 — End: 2022-07-10
  Administered 2022-07-10: 10 mg

## 2022-07-11 ENCOUNTER — Other Ambulatory Visit: Payer: Self-pay | Admitting: Podiatry

## 2022-07-11 DIAGNOSIS — M21611 Bunion of right foot: Secondary | ICD-10-CM

## 2022-07-11 DIAGNOSIS — M7751 Other enthesopathy of right foot: Secondary | ICD-10-CM

## 2022-07-11 DIAGNOSIS — Q828 Other specified congenital malformations of skin: Secondary | ICD-10-CM

## 2022-07-11 DIAGNOSIS — E1140 Type 2 diabetes mellitus with diabetic neuropathy, unspecified: Secondary | ICD-10-CM

## 2022-07-11 NOTE — Progress Notes (Signed)
Patient presents subjective:   Patient ID: James Juarez, male   DOB: 82 y.o.   MRN: [id number]   HPI A lot of inflammation around the fifth MPJ right painful when pressed with lesion formation x 2 long-term history of diabetes with neurological manifestations   ROS      Objective:  Physical Exam  Vascular status intact neurologically sharp dull vibratory moderately diminished with patient found to have fluid buildup around the fifth MPJ right foot painful when pressed with lesion x 2 right that are lucent in their nature and painful     Assessment:  In laboratory capsulitis fifth MPJ right porokeratosis with at risk diabetic neuropathic condition     Plan:  8 MP reviewed I did do sterile prep I injected this lateral and plantar capsule 3 mg Dexasone Kenalog 5 mg Xylocaine debrided several lesions instructed on daily inspections no iatrogenic bleeding noted  X-ray dated today did not indicate bony pathology around the area of the fifth MPJ appears to be soft tissue

## 2022-07-19 ENCOUNTER — Other Ambulatory Visit: Payer: Self-pay | Admitting: Endocrinology

## 2022-08-10 DIAGNOSIS — M542 Cervicalgia: Secondary | ICD-10-CM | POA: Diagnosis not present

## 2022-08-10 DIAGNOSIS — M25511 Pain in right shoulder: Secondary | ICD-10-CM | POA: Diagnosis not present

## 2022-08-15 DIAGNOSIS — D509 Iron deficiency anemia, unspecified: Secondary | ICD-10-CM | POA: Diagnosis not present

## 2022-08-23 DIAGNOSIS — M25511 Pain in right shoulder: Secondary | ICD-10-CM | POA: Diagnosis not present

## 2022-08-23 DIAGNOSIS — M542 Cervicalgia: Secondary | ICD-10-CM | POA: Diagnosis not present

## 2022-08-31 ENCOUNTER — Other Ambulatory Visit: Payer: Medicare PPO

## 2022-09-06 ENCOUNTER — Ambulatory Visit: Payer: Medicare PPO | Admitting: Endocrinology

## 2022-09-07 DIAGNOSIS — E039 Hypothyroidism, unspecified: Secondary | ICD-10-CM | POA: Diagnosis not present

## 2022-09-07 DIAGNOSIS — I2510 Atherosclerotic heart disease of native coronary artery without angina pectoris: Secondary | ICD-10-CM | POA: Diagnosis not present

## 2022-09-07 DIAGNOSIS — E785 Hyperlipidemia, unspecified: Secondary | ICD-10-CM | POA: Diagnosis not present

## 2022-09-07 DIAGNOSIS — H409 Unspecified glaucoma: Secondary | ICD-10-CM | POA: Diagnosis not present

## 2022-09-07 DIAGNOSIS — K219 Gastro-esophageal reflux disease without esophagitis: Secondary | ICD-10-CM | POA: Diagnosis not present

## 2022-09-07 DIAGNOSIS — I129 Hypertensive chronic kidney disease with stage 1 through stage 4 chronic kidney disease, or unspecified chronic kidney disease: Secondary | ICD-10-CM | POA: Diagnosis not present

## 2022-09-07 DIAGNOSIS — M1990 Unspecified osteoarthritis, unspecified site: Secondary | ICD-10-CM | POA: Diagnosis not present

## 2022-09-07 DIAGNOSIS — N189 Chronic kidney disease, unspecified: Secondary | ICD-10-CM | POA: Diagnosis not present

## 2022-09-07 DIAGNOSIS — G3184 Mild cognitive impairment, so stated: Secondary | ICD-10-CM | POA: Diagnosis not present

## 2022-09-20 ENCOUNTER — Other Ambulatory Visit: Payer: Medicare PPO

## 2022-09-21 DIAGNOSIS — Z23 Encounter for immunization: Secondary | ICD-10-CM | POA: Diagnosis not present

## 2022-09-21 DIAGNOSIS — N1831 Chronic kidney disease, stage 3a: Secondary | ICD-10-CM | POA: Diagnosis not present

## 2022-09-21 DIAGNOSIS — E1122 Type 2 diabetes mellitus with diabetic chronic kidney disease: Secondary | ICD-10-CM | POA: Diagnosis not present

## 2022-09-21 DIAGNOSIS — E7800 Pure hypercholesterolemia, unspecified: Secondary | ICD-10-CM | POA: Diagnosis not present

## 2022-09-21 DIAGNOSIS — D509 Iron deficiency anemia, unspecified: Secondary | ICD-10-CM | POA: Diagnosis not present

## 2022-09-25 ENCOUNTER — Ambulatory Visit: Payer: Medicare PPO | Admitting: Endocrinology

## 2022-09-26 ENCOUNTER — Encounter: Payer: Self-pay | Admitting: Endocrinology

## 2022-10-02 ENCOUNTER — Encounter: Payer: Self-pay | Admitting: Endocrinology

## 2022-10-06 ENCOUNTER — Other Ambulatory Visit: Payer: Medicare PPO

## 2022-10-09 ENCOUNTER — Other Ambulatory Visit (INDEPENDENT_AMBULATORY_CARE_PROVIDER_SITE_OTHER): Payer: Medicare PPO

## 2022-10-09 DIAGNOSIS — D513 Other dietary vitamin B12 deficiency anemia: Secondary | ICD-10-CM | POA: Diagnosis not present

## 2022-10-09 DIAGNOSIS — E119 Type 2 diabetes mellitus without complications: Secondary | ICD-10-CM | POA: Diagnosis not present

## 2022-10-09 DIAGNOSIS — D508 Other iron deficiency anemias: Secondary | ICD-10-CM

## 2022-10-09 LAB — COMPREHENSIVE METABOLIC PANEL
ALT: 18 U/L (ref 0–53)
AST: 27 U/L (ref 0–37)
Albumin: 4.1 g/dL (ref 3.5–5.2)
Alkaline Phosphatase: 44 U/L (ref 39–117)
BUN: 32 mg/dL — ABNORMAL HIGH (ref 6–23)
CO2: 25 meq/L (ref 19–32)
Calcium: 9.4 mg/dL (ref 8.4–10.5)
Chloride: 104 meq/L (ref 96–112)
Creatinine, Ser: 1.32 mg/dL (ref 0.40–1.50)
GFR: 49.59 mL/min — ABNORMAL LOW (ref >60.00)
Glucose, Bld: 105 mg/dL — ABNORMAL HIGH (ref 70–99)
Potassium: 4.6 meq/L (ref 3.5–5.1)
Sodium: 138 meq/L (ref 135–145)
Total Bilirubin: 0.5 mg/dL (ref 0.2–1.2)
Total Protein: 6.6 g/dL (ref 6.0–8.3)

## 2022-10-09 LAB — LIPID PANEL
Cholesterol: 150 mg/dL (ref 0–200)
HDL: 67.9 mg/dL (ref >39.00)
LDL Cholesterol: 67 mg/dL (ref 0–99)
NonHDL: 81.78
Total CHOL/HDL Ratio: 2
Triglycerides: 73 mg/dL (ref 0.0–149.0)
VLDL: 14.6 mg/dL (ref 0.0–40.0)

## 2022-10-09 LAB — CBC
HCT: 35.8 % — ABNORMAL LOW (ref 39.0–52.0)
Hemoglobin: 11.4 g/dL — ABNORMAL LOW (ref 13.0–17.0)
MCHC: 32 g/dL (ref 30.0–36.0)
MCV: 94.4 fL (ref 78.0–100.0)
Platelets: 197 10*3/uL (ref 150.0–400.0)
RBC: 3.79 Mil/uL — ABNORMAL LOW (ref 4.22–5.81)
RDW: 14.3 % (ref 11.5–15.5)
WBC: 5.9 10*3/uL (ref 4.0–10.5)

## 2022-10-09 LAB — T4, FREE: Free T4: 1.03 ng/dL (ref 0.60–1.60)

## 2022-10-09 LAB — VITAMIN B12: Vitamin B-12: 312 pg/mL (ref 211–911)

## 2022-10-09 LAB — TSH: TSH: 1.44 u[IU]/mL (ref 0.35–5.50)

## 2022-10-09 LAB — HEMOGLOBIN A1C: Hgb A1c MFr Bld: 6.3 % (ref 4.6–6.5)

## 2022-10-11 ENCOUNTER — Encounter: Payer: Self-pay | Admitting: Endocrinology

## 2022-10-11 ENCOUNTER — Other Ambulatory Visit: Payer: Self-pay

## 2022-10-11 ENCOUNTER — Ambulatory Visit: Payer: Medicare PPO | Admitting: Endocrinology

## 2022-10-11 VITALS — BP 117/70 | HR 78 | Ht 67.0 in | Wt 175.8 lb

## 2022-10-11 DIAGNOSIS — D508 Other iron deficiency anemias: Secondary | ICD-10-CM | POA: Diagnosis not present

## 2022-10-11 DIAGNOSIS — E538 Deficiency of other specified B group vitamins: Secondary | ICD-10-CM | POA: Diagnosis not present

## 2022-10-11 DIAGNOSIS — E063 Autoimmune thyroiditis: Secondary | ICD-10-CM | POA: Diagnosis not present

## 2022-10-11 DIAGNOSIS — Z7984 Long term (current) use of oral hypoglycemic drugs: Secondary | ICD-10-CM | POA: Diagnosis not present

## 2022-10-11 DIAGNOSIS — E559 Vitamin D deficiency, unspecified: Secondary | ICD-10-CM

## 2022-10-11 DIAGNOSIS — E119 Type 2 diabetes mellitus without complications: Secondary | ICD-10-CM

## 2022-10-11 NOTE — Progress Notes (Unsigned)
Outpatient Endocrinology Note [place] Dembeck, MD  10/12/22  Patient's Name: James Juarez    DOB: Apr 21, 1940    MRN: [id number]                                                    REASON OF VISIT: Follow-up for type 2 diabetes mellitus  PCP: Darrow Bussing, MD  HISTORY OF PRESENT ILLNESS:   Hancel A Wachter is a 82 y.o. old male with past medical history listed below, is here for follow up for type 2 diabetes mellitus /hypothyroidism.  Patient was previously seen by Dr. Lucianne Muss in this clinic.  Pertinent Diabetes History: Patient has longstanding history of type 2 diabetes mellitus.  He has well-controlled type 2 diabetes mellitus on same 4 drug regimen which has been unchanged for several years.  Patient was previously seen by Dr. Lucianne Muss in this clinic.  He was last seen by Dr. Lucianne Muss in March 2024 however recent office notes by Dr. Lucianne Muss including office notes from March 27, 2022, 08/18/2021, 01/27/2021, are not granted access to me to review.  Most recent office note by Dr. Lucianne Muss, I was able  to review was from July 2022.  Chronic Diabetes Complications : Retinopathy: no. Last ophthalmology exam was done on annually.  Nephropathy: CKD IIIa, on losartan.  No microalbuminuria. Peripheral neuropathy: Not as much sensitivity and tingling in the feet.   Coronary artery disease: no Stroke: no, h/o subdural hematoma 2013.   Relevant comorbidities and cardiovascular risk factors: Obesity: no Body mass index is 27.53 kg/m.  Hypertension: yes Hyperlipidemia. Yes, on a statin.  Current / Home Diabetic regimen includes: Glipizide extended release 5 mg daily. Janumet 50/1000 mg 1 tablet two times a day. Pioglitazone/Actos 30 mg daily.  Prior diabetic medications:  Glycemic data: Glucometer Accu-Chek Aviva Not able to download the glucometer in the clinic today.  Blood sugar are directly reviewed from the meter as follows : 118, 117, 119, 90, 122, 103, 98, 130, 164, 121, 120, 101, 119, 119, 122, 134,  127, 117.  All acceptable blood sugar.  Hypoglycemia: Patient has  hypoglycemic episodes. Patient has hypoglycemia awareness.  Factors modifying glucose control: 1.  Diabetic diet assessment: Well-controlled diet rarely getting more carbohydrates or sweets.  2.  Staying active or exercising: Walking at least 4 -6 days a week.  3.  Medication compliance: compliant all of the time.  # Primary hypothyroidism : -Patient has longstanding history of mild hypothyroidism since 1995.  He has been on levothyroxine stable dose 75 mcg daily with stable thyroid levels.  # Iron deficiency anemia : He is on oral fusion iron supplement, managed by primary care provider.  # He has been taking vitamin B12 supplement for pernicious anemia and level is normal.  # Vitamin D deficiency : He is on vitamin D supplement with normal vitamin D level.  Interval history 10/12/22 Patient had labs recently on 9/30 for CBC, CMP, lipid panel, vitamin B12, thyroid function test, hemoglobin A1c and reviewed in the clinic today.  Normal thyroid function test.  Hemoglobin A1c is improved 6.3%. Stable renal function. Lipid levels normal with LDL 67. B12 : 312. CBC with low Hb slightly improved compared to March.   Patient reports he has been taking iron supplement. He has history of iron deficiency anemia.  He wants to check iron  profile and ferritin.  Discussed about checking at this time versus with primary care provider or in the follow-up visit with me in 6 months.  Patient preferred to check during follow-up visit with me in 6 months.  REVIEW OF SYSTEMS As per history of present illness.   PAST MEDICAL HISTORY: Past Medical History:  Diagnosis Date   Asthma    Diabetes mellitus    Diverticular disease    Hypertension    Subdural hematoma [facility]) May 2013   bilateral     PAST SURGICAL HISTORY: Past Surgical History:  Procedure Laterality Date   BURR HOLE  05/22/2011   Procedure: [name];  Surgeon: Mariam Dollar, MD;  Location: [facility];  Service: Neurosurgery;  Laterality: Right;  Right [facility] for evacuation of subdural hematoma   BURR HOLE  05/23/2011   Procedure: [name];  Surgeon: Mariam Dollar, MD;  Location: [facility];  Service: Neurosurgery;  Laterality: Left;  Left [facility]   CRANIOTOMY  06/14/2011   Procedure: CRANIOTOMY HEMATOMA EVACUATION SUBDURAL;  Surgeon: Mariam Dollar, MD;  Location: [facility];  Service: Neurosurgery;  Laterality: N/A;  Left Craniotomy for subdural hematoma   CRANIOTOMY  06/23/2011   Procedure: CRANIOTOMY HEMATOMA EVACUATION SUBDURAL;  Surgeon: Mariam Dollar, MD;  Location: [facility];  Service: Neurosurgery;  Laterality: Left;  Redo Craniotomy for Subdural Hematoma   HERNIA REPAIR     INGUINAL HERNIA REPAIR Left 09/27/2016   Procedure: LAPAROSCOPIC LEFT INGUINAL HERNIA REPAIR;  Surgeon: Berna Bue, MD;  Location: [facility] ORS;  Service: General;  Laterality: Left;   INSERTION OF MESH Left 09/27/2016   Procedure: INSERTION OF MESH;  Surgeon: Berna Bue, MD;  Location: [facility] ORS;  Service: General;  Laterality: Left;    ALLERGIES: No Known Allergies  FAMILY HISTORY:  Family History  Problem Relation Age of Onset   Diabetes Mother    Diabetes Father    Heart attack Father    Diabetes Sister    Diabetes Brother    Heart attack Brother     SOCIAL HISTORY: Social History   Socioeconomic History   Marital status: Married    Spouse name: Not on file   Number of children: Not on file   Years of education: Not on file   Highest education level: Not on file  Occupational History   Not on file  Tobacco Use   Smoking status: Former    Current packs/day: 0.00    Average packs/day: 1 pack/day for 15.0 years (15.0 ttl pk-yrs)    Types: Cigarettes    Start date: 42    Quit date: 38    Years since quitting: 47.7   Smokeless tobacco: Never  Vaping Use   Vaping status: Never Used  Substance and Sexual Activity   Alcohol use: No   Drug  use: No   Sexual activity: Not Currently  Other Topics Concern   Not on file  Social History Narrative   Not on file   Social Determinants of Health   Financial Resource Strain: Not on file  Food Insecurity: No Food Insecurity (10/13/2021)   Hunger Vital Sign    Worried About Running Out of Food in the Last Year: Never true    Ran Out of Food in the Last Year: Never true  Transportation Needs: No Transportation Needs (10/13/2021)   PRAPARE - Administrator, Civil Service (Medical): No    Lack of Transportation (Non-Medical):  No  Physical Activity: Not on file  Stress: Not on file  Social Connections: Not on file    MEDICATIONS:  Current Outpatient Medications  Medication Sig Dispense Refill   ACCU-CHEK FASTCLIX LANCETS MISC Use to check blood sugar 2 times per day dx code E11.65 102 each 3   ACCU-CHEK GUIDE test strip USE TO CHECK BLOOD SUGAR ONCE DAILY 100 strip 12   acetaminophen (TYLENOL) 325 MG tablet Take 325 mg by mouth every 6 (six) hours as needed. For pain     albuterol (PROVENTIL HFA;VENTOLIN HFA) 108 (90 BASE) MCG/ACT inhaler Inhale 2 puffs into the lungs every 6 (six) hours as needed. For shortness of breath     amLODipine (NORVASC) 5 MG tablet TAKE 1 TABLET BY MOUTH EVERY DAY 90 tablet 1   Ascorbic Acid (VITAMIN C) 100 MG tablet Take 100 mg by mouth daily.     Blood Glucose Monitoring Suppl (ACCU-CHEK AVIVA PLUS) w/Device KIT Use to check blood sugars 2 times daily, Dx Code E11.65 1 kit 1   cholecalciferol (VITAMIN D3) 25 MCG (1000 UNIT) tablet Take 1,000 Units by mouth 3 (three) times a week.     clobetasol (TEMOVATE) 0.05 % GEL Apply 1 application topically daily as needed. For face.     dorzolamide-timolol (COSOPT) 22.3-6.8 MG/ML ophthalmic solution      fexofenadine (ALLEGRA) 180 MG tablet Take 180 mg by mouth daily as needed. For allergies     glipiZIDE (GLUCOTROL XL) 5 MG 24 hr tablet TAKE 1 TABLET BY MOUTH EVERY DAY 90 tablet 1   hydroxypropyl  methylcellulose (ISOPTO TEARS) 2.5 % ophthalmic solution Place 1 drop into both eyes at bedtime.      JANUMET 50-1000 MG tablet TAKE 1 TABLET BY MOUTH TWICE A DAY WITH FOOD 180 tablet 1   l-methylfolate-B6-B12 (METANX) 3-35-2 MG TABS tablet Take 1 tablet by mouth 2 (two) times daily. 180 tablet 5   latanoprost (XALATAN) 0.005 % ophthalmic solution SMARTSIG:1 Drop(s) In Eye(s) Every Evening     levothyroxine (SYNTHROID) 75 MCG tablet TAKE 1 TABLET BY MOUTH EVERY DAY BEFORE BREAKFAST 90 tablet 1   losartan (COZAAR) 25 MG tablet Take 25 mg by mouth daily.     Multiple Vitamin (MULTIVITAMIN) capsule Take 1 capsule by mouth daily.     pioglitazone (ACTOS) 30 MG tablet TAKE 1 TABLET BY MOUTH EVERY DAY 90 tablet 0   rosuvastatin (CRESTOR) 10 MG tablet TAKE 1 TABLET BY MOUTH EVERY DAY 90 tablet 4   No current facility-administered medications for this visit.    PHYSICAL EXAM: Vitals:   10/11/22 1300  BP: 117/70  Pulse: 78  SpO2: 98%  Weight: 175 lb 12.8 oz (79.7 kg)  Height: 5\' 7"  (1.702 m)   Body mass index is 27.53 kg/m.  Wt Readings from Last 3 Encounters:  10/11/22 175 lb 12.8 oz (79.7 kg)  03/27/22 167 lb 9.6 oz (76 kg)  10/27/21 166 lb 6.4 oz (75.5 kg)    General: Well developed, well nourished male in no apparent distress.  HEENT: AT/, no external lesions.  Eyes: Conjunctiva clear and no icterus. Neck: Neck supple  Lungs: Respirations not labored Neurologic: Alert, oriented, normal speech Extremities / Skin: Dry. .  Psychiatric: Does not appear depressed or anxious  Diabetic Foot Exam - Simple   No data filed     LABS Reviewed Lab Results  Component Value Date   HGBA1C 6.3 10/09/2022   HGBA1C 6.6 (H) 03/24/2022   HGBA1C 6.7 (H) 08/16/2021  No results found for: "FRUCTOSAMINE" Lab Results  Component Value Date   CHOL 150 10/09/2022   HDL 67.90 10/09/2022   LDLCALC 67 10/09/2022   TRIG 73.0 10/09/2022   CHOLHDL 2 10/09/2022   Lab Results  Component Value  Date   MICRALBCREAT 0.6 03/24/2022   MICRALBCREAT 0.7 01/24/2021   Lab Results  Component Value Date   CREATININE 1.32 10/09/2022   Lab Results  Component Value Date   GFR 49.59 (L) 10/09/2022    ASSESSMENT / PLAN  1. Diabetes mellitus type 2 in nonobese (HCC)   2. Other iron deficiency anemias   3. Vitamin D deficiency   4. Vitamin B12 deficiency   5. Acquired autoimmune hypothyroidism     Diabetes Mellitus type 2, complicated by CKD.  - Diabetic status / severity: controlled.   Lab Results  Component Value Date   HGBA1C 6.3 10/09/2022    - Hemoglobin A1c goal : <7%  - Medications: no change.  Glipizide extended release 5 mg daily. Janumet 50/1000 mg 1 tablet two times a day. Pioglitazone/Actos 30 mg daily.  - Home glucose testing: check in the morning fasting daily and occasionally at bedtime.  - Discussed/ Gave Hypoglycemia treatment plan.  # Consult : not required at this time.   # Annual urine for microalbuminuria/ creatinine ratio, no microalbuminuria currently, continue ACE/ARB / losartan:, will check urine microalbumin creatinine ratio and CMP in 6 months. Last  Lab Results  Component Value Date   MICRALBCREAT 0.6 03/24/2022    # Foot check nightly / neuropathy.  # Annual dilated diabetic eye exams.   2. Blood pressure  -  BP Readings from Last 1 Encounters:  10/11/22 117/70    - Control is in target.  - No change in current plans.  3. Lipid status / Hyperlipidemia - Last  Lab Results  Component Value Date   LDLCALC 67 10/09/2022   - Continue rosuvastatin 10 mg daily.   # Primary hypothyroidism -Recent thyroid lab normal.  Thyroid function test every 6 months for now. -Continue current dose of levothyroxine 75 mcg daily.  # Iron deficiency anemia. -He had low ferritin in the past.  He has been on he is on iron supplement managed by primary care provider. -He reports she had workup for chronic iron deficiency anemia including normal   colonoscopy in the past. -I have asked patient to discuss with primary care provider for management including iron supplement and required evaluation of iron deficiency anemia. -He requested to check iron panel including ferritin in his clinic as well.  Will check iron studies and CBC in 6 months.  # Vitamin D deficiency: -Normal vitamin D level in March.  Continue current dose of vitamin D supplement.  Annual vitamin D lab. -Recheck lab in 6 months.  # Vitamin B12 supplement /pernicious anemia : Recent vitamin B12 levels normal: Continue current vitamin B12 supplement.  Recheck vitamin B12 in 6 months.   Diagnoses and all orders for this visit:  Diabetes mellitus type 2 in nonobese (HCC) -     Microalbumin / creatinine urine ratio; Future -     Hemoglobin A1c; Future -     Comprehensive metabolic panel; Future -     CBC; Future  Other iron deficiency anemias  Vitamin D deficiency -     Iron, TIBC and Ferritin Panel; Future -     VITAMIN D 25 Hydroxy (Vit-D Deficiency, Fractures); Future  Vitamin B12 deficiency -     Vitamin B12;  Future  Acquired autoimmune hypothyroidism -     TSH; Future -     T4, free; Future    DISPOSITION Follow up in clinic in 6  months suggested.   All questions answered and patient verbalized understanding of the plan.  [place] Jaymes Revels, MD [facility] Endocrinology [facility] [address] [place], [address] [place], [place] [address] Phone # [phone]  At least part of this note was generated using voice recognition software. Inadvertent word errors may have occurred, which were not recognized during the proofreading process.

## 2022-10-12 ENCOUNTER — Encounter: Payer: Self-pay | Admitting: Endocrinology

## 2022-10-12 DIAGNOSIS — R350 Frequency of micturition: Secondary | ICD-10-CM | POA: Diagnosis not present

## 2022-10-12 DIAGNOSIS — N401 Enlarged prostate with lower urinary tract symptoms: Secondary | ICD-10-CM | POA: Diagnosis not present

## 2022-10-12 DIAGNOSIS — R3912 Poor urinary stream: Secondary | ICD-10-CM | POA: Diagnosis not present

## 2022-10-12 DIAGNOSIS — R351 Nocturia: Secondary | ICD-10-CM | POA: Diagnosis not present

## 2022-10-26 ENCOUNTER — Telehealth: Payer: Self-pay

## 2022-10-26 ENCOUNTER — Other Ambulatory Visit: Payer: Self-pay

## 2022-10-26 ENCOUNTER — Ambulatory Visit: Payer: Self-pay | Admitting: Cardiology

## 2022-10-26 DIAGNOSIS — E119 Type 2 diabetes mellitus without complications: Secondary | ICD-10-CM

## 2022-10-26 MED ORDER — GLIPIZIDE ER 5 MG PO TB24: 5.0000 mg | ORAL_TABLET | Freq: Every day | ORAL | 1 refills | Status: DC

## 2022-10-26 MED ORDER — PIOGLITAZONE HCL 30 MG PO TABS: 30.0000 mg | ORAL_TABLET | Freq: Every day | ORAL | 0 refills | Status: DC

## 2022-10-26 MED ORDER — JANUMET 50-1000 MG PO TABS: 1.0000 | ORAL_TABLET | Freq: Two times a day (BID) | ORAL | 1 refills | Status: DC

## 2022-10-26 NOTE — Telephone Encounter (Signed)
VM left for medication request refills, completed, patient made aware via myChart

## 2022-10-27 ENCOUNTER — Other Ambulatory Visit: Payer: Self-pay

## 2022-10-27 DIAGNOSIS — E119 Type 2 diabetes mellitus without complications: Secondary | ICD-10-CM

## 2022-10-27 DIAGNOSIS — I10 Essential (primary) hypertension: Secondary | ICD-10-CM

## 2022-10-27 MED ORDER — GLIPIZIDE ER 5 MG PO TB24: 5.0000 mg | ORAL_TABLET | Freq: Every day | ORAL | 1 refills | Status: DC

## 2022-10-27 MED ORDER — AMLODIPINE BESYLATE 5 MG PO TABS: 5.0000 mg | ORAL_TABLET | Freq: Every day | ORAL | 1 refills | Status: DC

## 2022-10-27 NOTE — Telephone Encounter (Signed)
Medication refill request sent, patient made aware via MyChart

## 2022-11-01 DIAGNOSIS — H40032 Anatomical narrow angle, left eye: Secondary | ICD-10-CM | POA: Diagnosis not present

## 2022-11-01 DIAGNOSIS — H524 Presbyopia: Secondary | ICD-10-CM | POA: Diagnosis not present

## 2022-11-01 DIAGNOSIS — H353131 Nonexudative age-related macular degeneration, bilateral, early dry stage: Secondary | ICD-10-CM | POA: Diagnosis not present

## 2022-11-01 DIAGNOSIS — E119 Type 2 diabetes mellitus without complications: Secondary | ICD-10-CM | POA: Diagnosis not present

## 2022-11-01 DIAGNOSIS — H401231 Low-tension glaucoma, bilateral, mild stage: Secondary | ICD-10-CM | POA: Diagnosis not present

## 2022-11-01 LAB — HM DIABETES EYE EXAM

## 2022-11-07 DIAGNOSIS — D509 Iron deficiency anemia, unspecified: Secondary | ICD-10-CM | POA: Diagnosis not present

## 2022-11-07 DIAGNOSIS — Z23 Encounter for immunization: Secondary | ICD-10-CM | POA: Diagnosis not present

## 2022-12-19 ENCOUNTER — Other Ambulatory Visit: Payer: Self-pay | Admitting: Cardiology

## 2022-12-19 DIAGNOSIS — E7800 Pure hypercholesterolemia, unspecified: Secondary | ICD-10-CM

## 2022-12-25 DIAGNOSIS — R3915 Urgency of urination: Secondary | ICD-10-CM | POA: Diagnosis not present

## 2022-12-25 DIAGNOSIS — N401 Enlarged prostate with lower urinary tract symptoms: Secondary | ICD-10-CM | POA: Diagnosis not present

## 2022-12-25 DIAGNOSIS — R350 Frequency of micturition: Secondary | ICD-10-CM | POA: Diagnosis not present

## 2022-12-25 DIAGNOSIS — R3912 Poor urinary stream: Secondary | ICD-10-CM | POA: Diagnosis not present

## 2022-12-27 ENCOUNTER — Ambulatory Visit: Payer: Medicare PPO | Admitting: Cardiology

## 2023-01-05 DIAGNOSIS — R350 Frequency of micturition: Secondary | ICD-10-CM | POA: Diagnosis not present

## 2023-01-05 DIAGNOSIS — R3912 Poor urinary stream: Secondary | ICD-10-CM | POA: Diagnosis not present

## 2023-01-05 DIAGNOSIS — R3915 Urgency of urination: Secondary | ICD-10-CM | POA: Diagnosis not present

## 2023-01-05 DIAGNOSIS — N401 Enlarged prostate with lower urinary tract symptoms: Secondary | ICD-10-CM | POA: Diagnosis not present

## 2023-01-05 DIAGNOSIS — R351 Nocturia: Secondary | ICD-10-CM | POA: Diagnosis not present

## 2023-01-24 DIAGNOSIS — N1831 Chronic kidney disease, stage 3a: Secondary | ICD-10-CM | POA: Diagnosis not present

## 2023-01-24 DIAGNOSIS — E1122 Type 2 diabetes mellitus with diabetic chronic kidney disease: Secondary | ICD-10-CM | POA: Diagnosis not present

## 2023-01-24 DIAGNOSIS — K602 Anal fissure, unspecified: Secondary | ICD-10-CM | POA: Diagnosis not present

## 2023-01-26 ENCOUNTER — Other Ambulatory Visit: Payer: Self-pay | Admitting: Endocrinology

## 2023-01-26 DIAGNOSIS — E119 Type 2 diabetes mellitus without complications: Secondary | ICD-10-CM

## 2023-02-01 ENCOUNTER — Other Ambulatory Visit: Payer: Self-pay

## 2023-02-01 DIAGNOSIS — E7800 Pure hypercholesterolemia, unspecified: Secondary | ICD-10-CM

## 2023-02-01 MED ORDER — ROSUVASTATIN CALCIUM 10 MG PO TABS: 10.0000 mg | ORAL_TABLET | Freq: Every day | ORAL | 0 refills | Status: DC

## 2023-02-07 ENCOUNTER — Ambulatory Visit: Payer: Medicare PPO | Attending: Cardiology | Admitting: Cardiology

## 2023-02-07 ENCOUNTER — Encounter: Payer: Self-pay | Admitting: Cardiology

## 2023-02-07 VITALS — BP 126/64 | HR 80 | Resp 16 | Ht 67.0 in

## 2023-02-07 DIAGNOSIS — I10 Essential (primary) hypertension: Secondary | ICD-10-CM | POA: Diagnosis not present

## 2023-02-07 DIAGNOSIS — E1122 Type 2 diabetes mellitus with diabetic chronic kidney disease: Secondary | ICD-10-CM | POA: Diagnosis not present

## 2023-02-07 DIAGNOSIS — N1831 Chronic kidney disease, stage 3a: Secondary | ICD-10-CM

## 2023-02-07 DIAGNOSIS — E7800 Pure hypercholesterolemia, unspecified: Secondary | ICD-10-CM | POA: Diagnosis not present

## 2023-02-07 DIAGNOSIS — I6523 Occlusion and stenosis of bilateral carotid arteries: Secondary | ICD-10-CM

## 2023-02-07 DIAGNOSIS — I358 Other nonrheumatic aortic valve disorders: Secondary | ICD-10-CM

## 2023-02-07 DIAGNOSIS — Z794 Long term (current) use of insulin: Secondary | ICD-10-CM

## 2023-02-07 MED ORDER — ASPIRIN 81 MG PO TBEC
81.0000 mg | DELAYED_RELEASE_TABLET | Freq: Every day | ORAL | Status: AC
Start: 2023-02-07 — End: ?

## 2023-02-07 NOTE — Patient Instructions (Addendum)
Medication Instructions:  Your physician has recommended you make the following change in your medication: Start aspirin 81 mg by mouth daily   *If you need a refill on your cardiac medications before your next appointment, please call your pharmacy*   Lab Work: none If you have labs (blood work) drawn today and your tests are completely normal, you will receive your results only by: MyChart Message (if you have MyChart) OR A paper copy in the mail If you have any lab test that is abnormal or we need to change your treatment, we will call you to review the results.   Testing/Procedures: Your physician has requested that you have an echocardiogram. Echocardiography is a painless test that uses sound waves to create images of your heart. It provides your doctor with information about the size and shape of your heart and how well your heart's chambers and valves are working. This procedure takes approximately one hour. There are no restrictions for this procedure. Please do NOT wear cologne, perfume, aftershave, or lotions (deodorant is allowed). Please arrive 15 minutes prior to your appointment time.  Please note: We ask at that you not bring children with you during ultrasound (echo/ vascular) testing. Due to room size and safety concerns, children are not allowed in the ultrasound rooms during exams. Our front office staff cannot provide observation of children in our lobby area while testing is being conducted. An adult accompanying a patient to their appointment will only be allowed in the ultrasound room at the discretion of the ultrasound technician under special circumstances. We apologize for any inconvenience.     Follow-Up: At [facility], you and your health needs are our priority.  As part of our continuing mission to provide you with exceptional heart care, we have created designated Provider Care Teams.  These Care Teams include your primary Cardiologist (physician)  and Advanced Practice Providers (APPs -  Physician Assistants and Nurse Practitioners) who all work together to provide you with the care you need, when you need it.  We recommend signing up for the patient portal called "MyChart".  Sign up information is provided on this After Visit Summary.  MyChart is used to connect with patients for Virtual Visits (Telemedicine).  Patients are able to view lab/test results, encounter notes, upcoming appointments, etc.  Non-urgent messages can be sent to your provider as well.   To learn more about what you can do with MyChart, go to [email or web address].    Your next appointment:   As needed  Provider:   Yates Decamp, MD     Other Instructions
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
## 2023-03-12 DIAGNOSIS — R52 Pain, unspecified: Secondary | ICD-10-CM | POA: Diagnosis not present

## 2023-03-12 DIAGNOSIS — J111 Influenza due to unidentified influenza virus with other respiratory manifestations: Secondary | ICD-10-CM | POA: Diagnosis not present

## 2023-03-12 DIAGNOSIS — R5383 Other fatigue: Secondary | ICD-10-CM | POA: Diagnosis not present

## 2023-03-12 DIAGNOSIS — R6883 Chills (without fever): Secondary | ICD-10-CM | POA: Diagnosis not present

## 2023-03-12 DIAGNOSIS — R051 Acute cough: Secondary | ICD-10-CM | POA: Diagnosis not present

## 2023-03-12 DIAGNOSIS — R062 Wheezing: Secondary | ICD-10-CM | POA: Diagnosis not present

## 2023-03-15 ENCOUNTER — Telehealth: Payer: Self-pay

## 2023-03-15 NOTE — Telephone Encounter (Signed)
 Patients wife called and lvm stating the pt was recently seen for flue symptoms and tested positive for Flu B. She said they prescribed tamiflu and promethazine for him and ever since he started taking the medications, his fasting BS are around 165 and after dinner it's around 180's. She said these aren't his usual readings and wanted to know if the medications can cause this or what you would advise.

## 2023-03-15 NOTE — Telephone Encounter (Signed)
 Getting ill itself can cause high blood sugar, it is probably related to having flu symptoms and flu.  It is okay to have that level of blood sugar for short period of time.  I would not change the diabetes medications.  I would expect to improve after healing from the flu.  James Quinita Kostelecky, MD [facility] Endocrinology [facility] [address] [place], [address] [place], [place] [address] Phone # [phone]

## 2023-03-20 NOTE — Telephone Encounter (Signed)
 He can double the dose of glipizide for coming 5 days.  He is currently taking glipizide extended release 5 mg 1 tablet daily.  He can take 2 tablets for coming 5 days.  And back down to 1 tablet daily.  Iraq Bretton Tandy, MD [facility] Endocrinology [facility] [address] [place], [address] [place], [place] [address] Phone # [phone]

## 2023-03-26 ENCOUNTER — Ambulatory Visit (HOSPITAL_COMMUNITY): Payer: Medicare PPO | Attending: Cardiology

## 2023-03-26 ENCOUNTER — Encounter: Payer: Self-pay | Admitting: Cardiology

## 2023-03-26 DIAGNOSIS — I358 Other nonrheumatic aortic valve disorders: Secondary | ICD-10-CM | POA: Diagnosis not present

## 2023-03-26 LAB — ECHOCARDIOGRAM COMPLETE
Area-P 1/2: 4.49 cm2
S' Lateral: 2.7 cm

## 2023-03-26 NOTE — Progress Notes (Signed)
Normal echocadiogram

## 2023-03-27 NOTE — Telephone Encounter (Signed)
 Called the patient and discussed with him regarding the echo findings, he has mild aortic valve sclerosis but no other significant valve abnormalities and LV function is completely normal as well.  I reassured him.

## 2023-03-29 ENCOUNTER — Other Ambulatory Visit: Payer: Self-pay

## 2023-03-29 ENCOUNTER — Other Ambulatory Visit: Payer: Self-pay | Admitting: Endocrinology

## 2023-03-29 DIAGNOSIS — E119 Type 2 diabetes mellitus without complications: Secondary | ICD-10-CM

## 2023-03-29 DIAGNOSIS — E063 Autoimmune thyroiditis: Secondary | ICD-10-CM

## 2023-03-29 DIAGNOSIS — E559 Vitamin D deficiency, unspecified: Secondary | ICD-10-CM

## 2023-03-29 DIAGNOSIS — E538 Deficiency of other specified B group vitamins: Secondary | ICD-10-CM

## 2023-03-29 DIAGNOSIS — I10 Essential (primary) hypertension: Secondary | ICD-10-CM

## 2023-04-04 ENCOUNTER — Other Ambulatory Visit: Payer: Medicare PPO

## 2023-04-04 DIAGNOSIS — E538 Deficiency of other specified B group vitamins: Secondary | ICD-10-CM | POA: Diagnosis not present

## 2023-04-04 DIAGNOSIS — E063 Autoimmune thyroiditis: Secondary | ICD-10-CM | POA: Diagnosis not present

## 2023-04-04 DIAGNOSIS — E119 Type 2 diabetes mellitus without complications: Secondary | ICD-10-CM | POA: Diagnosis not present

## 2023-04-04 DIAGNOSIS — E559 Vitamin D deficiency, unspecified: Secondary | ICD-10-CM | POA: Diagnosis not present

## 2023-04-05 ENCOUNTER — Encounter: Payer: Self-pay | Admitting: Endocrinology

## 2023-04-05 LAB — MICROALBUMIN / CREATININE URINE RATIO
Creatinine, Urine: 82 mg/dL (ref 20–320)
Microalb, Ur: <0.2 mg/dL

## 2023-04-05 LAB — VITAMIN D 25 HYDROXY (VIT D DEFICIENCY, FRACTURES): Vit D, 25-Hydroxy: 65 ng/mL (ref 30–100)

## 2023-04-05 LAB — CBC
HCT: 33.4 % — ABNORMAL LOW (ref 38.5–50.0)
Hemoglobin: 10.9 g/dL — ABNORMAL LOW (ref 13.2–17.1)
MCH: 30.2 pg (ref 27.0–33.0)
MCHC: 32.6 g/dL (ref 32.0–36.0)
MCV: 92.5 fL (ref 80.0–100.0)
MPV: 9.6 fL (ref 7.5–12.5)
Platelets: 221 10*3/uL (ref 140–400)
RBC: 3.61 10*6/uL — ABNORMAL LOW (ref 4.20–5.80)
RDW: 12.5 % (ref 11.0–15.0)
WBC: 6 10*3/uL (ref 3.8–10.8)

## 2023-04-05 LAB — COMPREHENSIVE METABOLIC PANEL WITH GFR
AG Ratio: 1.7 (calc) (ref 1.0–2.5)
ALT: 20 U/L (ref 9–46)
AST: 22 U/L (ref 10–35)
Albumin: 4.4 g/dL (ref 3.6–5.1)
Alkaline phosphatase (APISO): 52 U/L (ref 35–144)
BUN/Creatinine Ratio: 23 (calc) — ABNORMAL HIGH (ref 6–22)
BUN: 35 mg/dL — ABNORMAL HIGH (ref 7–25)
CO2: 27 mmol/L (ref 20–32)
Calcium: 9.2 mg/dL (ref 8.6–10.3)
Chloride: 103 mmol/L (ref 98–110)
Creat: 1.52 mg/dL — ABNORMAL HIGH (ref 0.70–1.22)
Globulin: 2.6 g/dL (ref 1.9–3.7)
Glucose, Bld: 121 mg/dL — ABNORMAL HIGH (ref 65–99)
Potassium: 4.5 mmol/L (ref 3.5–5.3)
Sodium: 138 mmol/L (ref 135–146)
Total Bilirubin: 0.6 mg/dL (ref 0.2–1.2)
Total Protein: 7 g/dL (ref 6.1–8.1)
eGFR: 45 mL/min/{1.73_m2} — ABNORMAL LOW (ref >=60)

## 2023-04-05 LAB — T4, FREE: Free T4: 1.6 ng/dL (ref 0.8–1.8)

## 2023-04-05 LAB — TSH: TSH: 2.46 m[IU]/L (ref 0.40–4.50)

## 2023-04-05 LAB — IRON,TIBC AND FERRITIN PANEL
%SAT: 24 % (ref 20–48)
Ferritin: 17 ng/mL — ABNORMAL LOW (ref 24–380)
Iron: 91 ug/dL (ref 50–180)
TIBC: 386 ug/dL (ref 250–425)

## 2023-04-05 LAB — HEMOGLOBIN A1C
Hgb A1c MFr Bld: 6.6 %{Hb} — ABNORMAL HIGH (ref <5.7)
Mean Plasma Glucose: 143 mg/dL
eAG (mmol/L): 7.9 mmol/L

## 2023-04-05 LAB — VITAMIN B12: Vitamin B-12: 680 pg/mL (ref 200–1100)

## 2023-04-11 ENCOUNTER — Ambulatory Visit: Payer: Medicare PPO | Admitting: Endocrinology

## 2023-04-11 ENCOUNTER — Encounter: Payer: Self-pay | Admitting: Endocrinology

## 2023-04-11 VITALS — BP 130/72 | HR 80 | Resp 20 | Ht 67.0 in | Wt 181.6 lb

## 2023-04-11 DIAGNOSIS — E538 Deficiency of other specified B group vitamins: Secondary | ICD-10-CM

## 2023-04-11 DIAGNOSIS — E119 Type 2 diabetes mellitus without complications: Secondary | ICD-10-CM | POA: Diagnosis not present

## 2023-04-11 DIAGNOSIS — E559 Vitamin D deficiency, unspecified: Secondary | ICD-10-CM

## 2023-04-11 DIAGNOSIS — E063 Autoimmune thyroiditis: Secondary | ICD-10-CM

## 2023-04-11 DIAGNOSIS — Z7984 Long term (current) use of oral hypoglycemic drugs: Secondary | ICD-10-CM

## 2023-04-11 DIAGNOSIS — D508 Other iron deficiency anemias: Secondary | ICD-10-CM

## 2023-04-11 MED ORDER — GLIPIZIDE ER 5 MG PO TB24
10.0000 mg | ORAL_TABLET | Freq: Every day | ORAL | 3 refills | Status: DC
Start: 2023-04-11 — End: 2023-10-18

## 2023-04-11 MED ORDER — LEVOTHYROXINE SODIUM 75 MCG PO TABS: ORAL_TABLET | ORAL | 3 refills | Status: DC

## 2023-04-11 MED ORDER — JANUMET 50-1000 MG PO TABS: 1.0000 | ORAL_TABLET | Freq: Two times a day (BID) | ORAL | 3 refills | Status: DC

## 2023-04-11 NOTE — Progress Notes (Signed)
 Outpatient Endocrinology Note [place] Mahsa Hanser, MD  04/11/23  Patient's Name: James Juarez    DOB: February 04, 1940    MRN: [id number]                                                    REASON OF VISIT: Follow-up for type 2 diabetes mellitus  PCP: Darrow Bussing, MD  HISTORY OF PRESENT ILLNESS:   Siah A Salehi is a 83 y.o. old male with past medical history listed below, is here for follow up for type 2 diabetes mellitus /hypothyroidism.  Patient was previously seen by Dr. Lucianne Muss in this clinic.  Pertinent Diabetes History: Patient has longstanding history of type 2 diabetes mellitus.  He has well-controlled type 2 diabetes mellitus on same 4 drug regimen which has been unchanged for several years.  Patient was previously seen by Dr. Lucianne Muss in this clinic.  He was last seen by Dr. Lucianne Muss in March 2024 however recent office notes by Dr. Lucianne Muss including office notes from March 27, 2022, 08/18/2021, 01/27/2021, are not granted access to me to review.  Most recent office note by Dr. Lucianne Muss, I was able  to review was from July 2022.  Chronic Diabetes Complications : Retinopathy: no. Last ophthalmology exam was done on annually.  Nephropathy: CKD IIIa, on losartan.  No microalbuminuria. Peripheral neuropathy: Not as much sensitivity and tingling in the feet.   Coronary artery disease: no Stroke: no, h/o subdural hematoma 2013.   Relevant comorbidities and cardiovascular risk factors: Obesity: no Body mass index is 28.44 kg/m.  Hypertension: yes Hyperlipidemia. Yes, on a statin.  Current / Home Diabetic regimen includes: Glipizide extended release 5 mg daily. Janumet 50/1000 mg 1 tablet two times a day. Pioglitazone/Actos 30 mg daily.  Prior diabetic medications:  Glycemic data: Glucometer Accu-Chek Aviva : He forgot to bring glucometer in the clinic today.  He reports blood sugar is running acceptable.  Hypoglycemia: Patient has  hypoglycemic episodes. Patient has hypoglycemia awareness.  Factors  modifying glucose control: 1.  Diabetic diet assessment: Well-controlled diet rarely getting more carbohydrates or sweets.  2.  Staying active or exercising: walking at least 4 -6 days a week.  3.  Medication compliance: compliant all of the time.  # Primary hypothyroidism : -Patient has longstanding history of mild hypothyroidism since 1995.  He has been on levothyroxine stable dose 75 mcg daily with stable thyroid levels.  # Iron deficiency anemia : He is on oral fusion iron supplement, managed by primary care provider.  # He has been taking vitamin B12 supplement for pernicious anemia and level is normal.  # Vitamin D deficiency : He is on vitamin D supplement with normal vitamin D level.  Interval history   Recent laboratory results reviewed.  He has mild elevated creatinine and a stable EGFR.  Normal urine microalbumin creatinine ratio.  CBC with low hemoglobin and low ferritin however vitamin level is improving.  Anemia is managed by primary care provider.  Vitamin B12 and vitamin D levels are acceptable.  Hemoglobin A1c 6.6%.  Diabetes regimen as reviewed and noted above.  He has complaints of lower extremities edema for few weeks.  Recently he also had flu and noted elevated blood sugar however really improved.  No other complaints today.   REVIEW OF SYSTEMS As per history of present illness.  PAST MEDICAL HISTORY: Past Medical History:  Diagnosis Date   Asthma    Diabetes mellitus    Diverticular disease    Hypertension    Subdural hematoma [facility]) May 2013   bilateral     PAST SURGICAL HISTORY: Past Surgical History:  Procedure Laterality Date   BURR HOLE  05/22/2011   Procedure: [name];  Surgeon: Mariam Dollar, MD;  Location: [facility];  Service: Neurosurgery;  Laterality: Right;  Right [facility] for evacuation of subdural hematoma   BURR HOLE  05/23/2011   Procedure: [name];  Surgeon: Mariam Dollar, MD;  Location: [facility];  Service: Neurosurgery;   Laterality: Left;  Left [facility]   CRANIOTOMY  06/14/2011   Procedure: CRANIOTOMY HEMATOMA EVACUATION SUBDURAL;  Surgeon: Mariam Dollar, MD;  Location: [facility];  Service: Neurosurgery;  Laterality: N/A;  Left Craniotomy for subdural hematoma   CRANIOTOMY  06/23/2011   Procedure: CRANIOTOMY HEMATOMA EVACUATION SUBDURAL;  Surgeon: Mariam Dollar, MD;  Location: [facility];  Service: Neurosurgery;  Laterality: Left;  Redo Craniotomy for Subdural Hematoma   HERNIA REPAIR     INGUINAL HERNIA REPAIR Left 09/27/2016   Procedure: LAPAROSCOPIC LEFT INGUINAL HERNIA REPAIR;  Surgeon: Berna Bue, MD;  Location: [facility];  Service: General;  Laterality: Left;   INSERTION OF MESH Left 09/27/2016   Procedure: INSERTION OF MESH;  Surgeon: Berna Bue, MD;  Location: [facility];  Service: General;  Laterality: Left;    ALLERGIES: No Known Allergies  FAMILY HISTORY:  Family History  Problem Relation Age of Onset   Diabetes Mother    Diabetes Father    Heart attack Father    Diabetes Sister    Diabetes Brother    Heart attack Brother     SOCIAL HISTORY: Social History   Socioeconomic History   Marital status: Married    Spouse name: Not on file   Number of children: Not on file   Years of education: Not on file   Highest education level: Not on file  Occupational History   Not on file  Tobacco Use   Smoking status: Former    Current packs/day: 0.00    Average packs/day: 1 pack/day for 15.0 years (15.0 ttl pk-yrs)    Types: Cigarettes    Start date: 59    Quit date: 77    Years since quitting: 48.2   Smokeless tobacco: Never  Vaping Use   Vaping status: Never Used  Substance and Sexual Activity   Alcohol use: No   Drug use: No   Sexual activity: Not Currently  Other Topics Concern   Not on file  Social History Narrative   Not on file   Social Drivers of Health   Financial Resource Strain: Not on file  Food Insecurity: No Food Insecurity (10/13/2021)   Hunger Vital  Sign    Worried About Running Out of Food in the Last Year: Never true    Ran Out of Food in the Last Year: Never true  Transportation Needs: No Transportation Needs (10/13/2021)   PRAPARE - Administrator, Civil Service (Medical): No    Lack of Transportation (Non-Medical): No  Physical Activity: Not on file  Stress: Not on file  Social Connections: Not on file    MEDICATIONS:  Current Outpatient Medications  Medication Sig Dispense Refill   ACCU-CHEK FASTCLIX LANCETS MISC Use to check blood sugar 2 times per day dx code E11.65 102  each 3   ACCU-CHEK GUIDE test strip USE TO CHECK BLOOD SUGAR ONCE DAILY 100 strip 12   acetaminophen (TYLENOL) 325 MG tablet Take 325 mg by mouth every 6 (six) hours as needed. For pain     albuterol (PROVENTIL HFA;VENTOLIN HFA) 108 (90 BASE) MCG/ACT inhaler Inhale 2 puffs into the lungs every 6 (six) hours as needed. For shortness of breath     amLODipine (NORVASC) 5 MG tablet TAKE 1 TABLET (5 MG TOTAL) BY MOUTH DAILY. 90 tablet 3   aspirin EC 81 MG tablet Take 1 tablet (81 mg total) by mouth daily. Swallow whole.     Blood Glucose Monitoring Suppl (ACCU-CHEK AVIVA PLUS) w/Device KIT Use to check blood sugars 2 times daily, Dx Code E11.65 1 kit 1   cholecalciferol (VITAMIN D3) 25 MCG (1000 UNIT) tablet Take 1,000 Units by mouth 3 (three) times a week.     clobetasol (TEMOVATE) 0.05 % GEL Apply 1 application topically daily as needed. For face.     dorzolamide-timolol (COSOPT) 22.3-6.8 MG/ML ophthalmic solution      fexofenadine (ALLEGRA) 180 MG tablet Take 180 mg by mouth daily as needed. For allergies     hydroxypropyl methylcellulose (ISOPTO TEARS) 2.5 % ophthalmic solution Place 1 drop into both eyes at bedtime.      l-methylfolate-B6-B12 (METANX) 3-35-2 MG TABS tablet Take 1 tablet by mouth 2 (two) times daily. 180 tablet 5   latanoprost (XALATAN) 0.005 % ophthalmic solution SMARTSIG:1 Drop(s) In Eye(s) Every Evening     losartan (COZAAR) 25 MG  tablet Take 25 mg by mouth daily.     Multiple Vitamin (MULTIVITAMIN) capsule Take 1 capsule by mouth daily.     pioglitazone (ACTOS) 30 MG tablet TAKE 1 TABLET BY MOUTH EVERY DAY 90 tablet 3   rosuvastatin (CRESTOR) 10 MG tablet Take 1 tablet (10 mg total) by mouth daily. 30 tablet 0   glipiZIDE (GLUCOTROL XL) 5 MG 24 hr tablet Take 2 tablets (10 mg total) by mouth daily. 180 tablet 3   levothyroxine (SYNTHROID) 75 MCG tablet TAKE 1 TABLET BY MOUTH EVERY DAY BEFORE BREAKFAST 90 tablet 3   sitaGLIPtin-metformin (JANUMET) 50-1000 MG tablet Take 1 tablet by mouth 2 (two) times daily with a meal. 180 tablet 3   No current facility-administered medications for this visit.    PHYSICAL EXAM: Vitals:   04/11/23 1309  BP: 130/72  Pulse: 80  Resp: 20  SpO2: 99%  Weight: 181 lb 9.6 oz (82.4 kg)  Height: 5\' 7"  (1.702 m)   Body mass index is 28.44 kg/m.  Wt Readings from Last 3 Encounters:  04/11/23 181 lb 9.6 oz (82.4 kg)  10/11/22 175 lb 12.8 oz (79.7 kg)  03/27/22 167 lb 9.6 oz (76 kg)    General: Well developed, well nourished male in no apparent distress.  HEENT: AT/[place], no external lesions.  Eyes: Conjunctiva clear and no icterus. Neck: Neck supple  Lungs: Respirations not labored Neurologic: Alert, oriented, normal speech Extremities / Skin: Dry. .  Psychiatric: Does not appear depressed or anxious  Diabetic Foot Exam - Simple   Simple Foot Form Diabetic Foot exam was performed with the following findings: Yes 04/11/2023  1:21 PM  Visual Inspection No deformities, no ulcerations, no other skin breakdown bilaterally: Yes Sensation Testing Intact to touch and monofilament testing bilaterally: Yes Pulse Check Posterior Tibialis and Dorsalis pulse intact bilaterally: Yes Comments     LABS Reviewed Lab Results  Component Value Date   HGBA1C 6.6 (  H) 04/04/2023   HGBA1C 6.3 10/09/2022   HGBA1C 6.6 (H) 03/24/2022   No results found for: "FRUCTOSAMINE" Lab Results  Component  Value Date   CHOL 150 10/09/2022   HDL 67.90 10/09/2022   LDLCALC 67 10/09/2022   TRIG 73.0 10/09/2022   CHOLHDL 2 10/09/2022   Lab Results  Component Value Date   MICRALBCREAT NOTE 04/04/2023   MICRALBCREAT 0.6 03/24/2022   Lab Results  Component Value Date   CREATININE 1.52 (H) 04/04/2023   Lab Results  Component Value Date   GFR 49.59 (L) 10/09/2022    ASSESSMENT / PLAN  1. Diabetes mellitus type 2 in nonobese (HCC)   2. Acquired autoimmune hypothyroidism   3. Vitamin B12 deficiency   4. Vitamin D deficiency   5. Other iron deficiency anemia      Diabetes Mellitus type 2, complicated by CKD.  - Diabetic status / severity: Fair controlled.   Lab Results  Component Value Date   HGBA1C 6.6 (H) 04/04/2023    - Hemoglobin A1c goal : <7%  He has pedal edema.  It can be related to using amlodipine or pioglitazone.  Will have trial of holding pioglitazone.  Adjusted diabetes regimen as follows.  - Medications: See below.  Glipizide extended release 5 mg daily.  Increase to 2 tablets daily, while holding pioglitazone. Janumet 50/1000 mg 1 tablet two times a day. Pioglitazone/Actos 30 mg daily, hold pioglitazone for now due to pedal edema.  - Home glucose testing: check in the morning fasting daily and occasionally at bedtime.  Asked to bring glucometer in the follow-up visit. - Discussed/ Gave Hypoglycemia treatment plan.  # Consult : not required at this time.   # Annual urine for microalbuminuria/ creatinine ratio, no microalbuminuria currently, continue ACE/ARB / losartan:. -Recent creatinine is mildly elevated.  I would like to recheck BMP with EGFR in 2 weeks.  Patient is advised for well hydration. Last  Lab Results  Component Value Date   MICRALBCREAT NOTE 04/04/2023    # Foot check nightly / neuropathy.  # Annual dilated diabetic eye exams.   2. Blood pressure  -  BP Readings from Last 1 Encounters:  04/11/23 130/72    - Control is in  target.  - No change in current plans.  Patient is currently on amlodipine.  He has pedal edema.  Holding pioglitazone for now.  If he continues to have pedal edema after holding amlodipine for about a month asked to discuss with primary care provider to adjust amlodipine and for blood pressure management.  3. Lipid status / Hyperlipidemia - Last  Lab Results  Component Value Date   LDLCALC 67 10/09/2022   - Continue rosuvastatin 10 mg daily.   # Primary hypothyroidism -Recent thyroid lab normal.  Thyroid function test every 6 months for now. -Continue current dose of levothyroxine 75 mcg daily.  # Iron deficiency anemia. -He had low ferritin in the past.  He has been on he is on iron supplement managed by primary care provider. -He reports he had workup for chronic iron deficiency anemia including normal  colonoscopy in the past. -I have asked patient to discuss with primary care provider for management including iron supplement and required evaluation of iron deficiency anemia. -He requested to check iron panel including ferritin in his clinic as well.  Will check ferritin and CBC in 6 months.  # Vitamin D deficiency: -Normal vitamin D level in March.  Continue current dose of vitamin D supplement.  Annual vitamin D lab.  # Vitamin B12 supplement /pernicious anemia : vitamin B12 levels normal: Continue current vitamin B12 supplement.    Diagnoses and all orders for this visit:  Diabetes mellitus type 2 in nonobese (HCC) -     glipiZIDE (GLUCOTROL XL) 5 MG 24 hr tablet; Take 2 tablets (10 mg total) by mouth daily. -     sitaGLIPtin-metformin (JANUMET) 50-1000 MG tablet; Take 1 tablet by mouth 2 (two) times daily with a meal. -     Basic Metabolic Panel Without GFR -     Basic Metabolic Panel Without GFR -     Hemoglobin A1c -     Lipid panel  Acquired autoimmune hypothyroidism -     levothyroxine (SYNTHROID) 75 MCG tablet; TAKE 1 TABLET BY MOUTH EVERY DAY BEFORE BREAKFAST -      T4, free -     TSH  Vitamin B12 deficiency  Vitamin D deficiency  Other iron deficiency anemia -     Ferritin     DISPOSITION Follow up in clinic in 6  months suggested.  Labs in 2 weeks.   All questions answered and patient verbalized understanding of the plan.  [place] Danyael Alipio, MD [facility] Endocrinology [facility] [address] [place], [address] [place], [place] [address] Phone # [phone]  At least part of this note was generated using voice recognition software. Inadvertent word errors may have occurred, which were not recognized during the proofreading process.

## 2023-04-11 NOTE — Patient Instructions (Addendum)
 Hold Actos due to leg swelling, at least for a month.  When you are holding Actos increase glipizide to 2 tablets daily.  If your leg swelling does not improve after holding Actos you may have to hold amlodipine also.  In that case please talk with your primary care provider before holding amlodipine for the adjustment of blood pressure medication.  Check BMP with EGFR for kidney function test and creatinine in 4 weeks.  Pioglitazone Tablets What is this medication? PIOGLITAZONE (pye oh GLI ta zone) treats type 2 diabetes. It helps your body use insulin effectively, which decreases your blood sugar (glucose). Changes to diet and exercise are often combined with this medication. This medicine may be used for other purposes; ask your health care provider or pharmacist if you have questions. COMMON BRAND NAME(S): Actos What should I tell my care team before I take this medication? They need to know if you have any of these conditions: Bladder cancer Diabetic ketoacidosis Eye disease called macular edema Heart disease Heart failure Kidney disease Liver disease Polycystic ovary syndrome Premenopausal Swelling of the arms, legs, or feet Type 1 diabetes An unusual or allergic reaction to pioglitazone, other medications, foods, dyes, or preservatives Pregnant or trying to get pregnant Breast-feeding How should I use this medication? Take this medication by mouth with a glass of water. Follow the directions on the prescription label. Take your medication at the same time each day. Do not take more often than directed. A special MedGuide will be given to you by the pharmacist with each prescription and refill. Be sure to read this information carefully each time. Talk to your care team about the use of this medication in children. Special care may be needed. Overdosage: If you think you have taken too much of this medicine contact a poison control center or emergency room at once. NOTE: This  medicine is only for you. Do not share this medicine with others. What if I miss a dose? If you miss a dose, take it as soon as you can. If it is almost time for your next dose, take only that dose. Do not take double or extra doses. What may interact with this medication? Gemfibrozil Rifampin Topiramate Other medications may affect the way this medication works. Talk with your care team about all the medications you take. They may suggest changes to your treatment plan to lower the risk of side effects and to make sure your medications work as intended. Some medications may affect your blood sugar levels or hide the symptoms of low blood sugar (hypoglycemia). Talk with your care team about all the medications you take. They may suggest changes to your dose or checking your blood sugar levels more often. Medications that may affect your blood sugar levels include: Alcohol Certain antibiotics, such as ciprofloxacin, levofloxacin, sulfamethoxazole; trimethoprim Certain medications for blood pressure or heart disease, such as benazepril, enalapril, lisinopril, losartan, valsartan Certain medications for mental health conditions, such as fluoxetine or olanzapine Diuretics, such as hydrochlorothiazide (HCTZ) Estrogen and progestin hormones Other medications for diabetes Steroid medications, such as prednisone or cortisone Testosterone Thyroid hormones Medications that may mask symptoms of low blood sugar include: Beta blockers, such as atenolol, metoprolol, propranolol Clonidine Guanethidine Reserpine This list may not describe all possible interactions. Give your health care provider a list of all the medicines, herbs, non-prescription drugs, or dietary supplements you use. Also tell them if you smoke, drink alcohol, or use illegal drugs. Some items may interact with your medicine.  What should I watch for while using this medication? Visit your care team for regular checks on your  progress. You may need blood work done while you are taking this medication. Your care team will monitor your HbA1C (A1C). This test shows what your average blood sugar (glucose) level was over the past 2 to 3 months. Know the symptoms of low blood sugar and know how to treat it. Always carry a source of quick sugar with you. Examples include hard sugar candy or glucose tablets. Make sure others know that you can choke if you eat or drink if your blood sugar is too low and you are unable to care for yourself. Get medical help at once. Tell your care team if you have high blood sugar. Your medication dose may change if your body is under stress. Some types of stress that may affect your blood sugar include fever, infection, and surgery. Wear a medical ID bracelet or chain. Carry a card that describes your condition. List the medications and doses you take on the card. This medication may cause you to ovulate, which may increase your chances of becoming pregnant. Talk with your care team about contraception while you are taking this medication. Contact your care team if you think you might be pregnant. What side effects may I notice from receiving this medication? Side effects that you should report to your care team as soon as possible: Allergic reactions--skin rash, itching, hives, swelling of the face, lips, tongue, or throat Change in vision such as blurry vision, seeing halos around lights, vision loss Heart failure--shortness of breath, swelling of the ankles, feet, or hands, sudden weight gain, unusual weakness or fatigue Liver injury--right upper belly pain, loss of appetite, nausea, light-colored stool, dark yellow or brown urine, yellowing skin or eyes, unusual weakness or fatigue Red or dark brown urine, pain or trouble when passing urine, passing frequent amounts of urine Side effects that usually do not require medical attention (report to your care team if they continue or are  bothersome): Headache Muscle pain Runny or stuffy nose This list may not describe all possible side effects. Call your doctor for medical advice about side effects. You may report side effects to FDA at 1-800-FDA-1088. Where should I keep my medication? Keep out of the reach of children and pets. Store at room temperature between 15 and 30 degrees C (59 and 86 degrees F). Keep container tightly closed and protect from moisture and humidity. Throw away any unused medication after the expiration date. NOTE: This sheet is a summary. It may not cover all possible information. If you have questions about this medicine, talk to your doctor, pharmacist, or health care provider.  2024 Elsevier/Gold Standard (2022-05-31 00:00:00)   Amlodipine Solution What is this medication? AMLODIPINE (am LOE di peen) treats high blood pressure and prevents chest pain (angina). It works by relaxing blood vessels, which decreases the amount of work the heart has to do. It belongs to a group of medications called calcium channel blockers. This medicine may be used for other purposes; ask your health care provider or pharmacist if you have questions. COMMON BRAND NAME(S): Norliqva What should I tell my care team before I take this medication? They need to know if you have any of these conditions: Heart disease Liver disease An unusual or allergic reaction to amlodipine, other medications, foods, dyes, or preservatives Pregnant or trying to get pregnant Breastfeeding How should I use this medication? Take this medication by mouth. Take it  as directed on the prescription label at the same time every day. Use a specially marked oral syringe, spoon, or dropper to measure each dose. Ask your pharmacist if you do not have one. Household spoons are not accurate. You can take it with or without food. If it upsets your stomach, take it with food. Keep taking it unless your care team tells you to stop. Talk to your care team  about the use of this medication in children. While it may be prescribed to children as young as 6 years for selected conditions, precautions do apply. Overdosage: If you think you have taken too much of this medicine contact a poison control center or emergency room at once. NOTE: This medicine is only for you. Do not share this medicine with others. What if I miss a dose? If you miss a dose, take it as soon as you can. If it is almost time for your next dose, take only that dose. Do not take double or extra doses. What may interact with this medication? Clarithromycin Cyclosporine Diltiazem Itraconazole Simvastatin Tacrolimus This list may not describe all possible interactions. Give your health care provider a list of all the medicines, herbs, non-prescription drugs, or dietary supplements you use. Also tell them if you smoke, drink alcohol, or use illegal drugs. Some items may interact with your medicine. What should I watch for while using this medication? Visit your care team for regular checks on your progress. Check your blood pressure as directed. Ask your care team what your blood pressure should be. Also, find out when you should contact them. Do not treat yourself for coughs, colds, or pain while you are using this medication without asking your care team for advice. Some medications may increase your blood pressure. This medication may affect your coordination, reaction time, or judgment. Do not drive or operate machinery until you know how this medication affects you. Sit up or stand slowly to reduce the risk of dizzy or fainting spells. Drinking alcohol with this medication can increase the risk of these side effects. What side effects may I notice from receiving this medication? Side effects that you should report to your care team as soon as possible: Allergic reactions--skin rash, itching, hives, swelling of the face, lips, tongue, or throat Heart attack--pain or tightness in  the chest, shoulders, arms, or jaw, nausea, shortness of breath, cold or clammy skin, feeling faint or lightheaded Low blood pressure--dizziness, feeling faint or lightheaded, blurry vision Side effects that usually do not require medical attention (report these to your care team if they continue or are bothersome): Facial flushing, redness Heart palpitations--rapid, pounding, or irregular heartbeat Nausea Stomach pain Swelling of the ankles, hands, or feet This list may not describe all possible side effects. Call your doctor for medical advice about side effects. You may report side effects to FDA at 1-800-FDA-1088. Where should I keep my medication? Keep out of the reach of children and pets. Store at room temperature between 20 and 25 degrees C (68 and 77 degrees F). Keep this medication in the original container. Get rid of any unused medication after the expiration date. To get rid of medications that are no longer needed or have expired: Take the medication to a medication take-back program. Check with your pharmacy or law enforcement to find a location. If you cannot return the medication, check the label or package insert to see if the medication should be thrown out in the garbage or flushed down the toilet. If  you are not sure, ask your care team. If it is safe to put it in the trash, pour the medication out of the container. Mix the medication with cat litter, dirt, coffee grounds, or other unwanted substance. Seal the mixture in a bag or container. Put it in the trash. NOTE: This sheet is a summary. It may not cover all possible information. If you have questions about this medicine, talk to your doctor, pharmacist, or health care provider.  2024 Elsevier/Gold Standard (2021-06-02 00:00:00)

## 2023-04-25 ENCOUNTER — Ambulatory Visit (HOSPITAL_COMMUNITY)
Admission: RE | Admit: 2023-04-25 | Discharge: 2023-04-25 | Disposition: A | Discharge status: Home / Self Care | Source: Ambulatory Visit | Attending: Family Medicine | Admitting: Family Medicine

## 2023-04-25 ENCOUNTER — Other Ambulatory Visit (HOSPITAL_COMMUNITY): Payer: Self-pay | Admitting: Family Medicine

## 2023-04-25 DIAGNOSIS — S0990XA Unspecified injury of head, initial encounter: Secondary | ICD-10-CM | POA: Diagnosis not present

## 2023-04-25 DIAGNOSIS — I6782 Cerebral ischemia: Secondary | ICD-10-CM | POA: Diagnosis not present

## 2023-04-30 DIAGNOSIS — S0083XD Contusion of other part of head, subsequent encounter: Secondary | ICD-10-CM | POA: Diagnosis not present

## 2023-04-30 DIAGNOSIS — M7918 Myalgia, other site: Secondary | ICD-10-CM | POA: Diagnosis not present

## 2023-05-09 ENCOUNTER — Other Ambulatory Visit

## 2023-05-09 DIAGNOSIS — E119 Type 2 diabetes mellitus without complications: Secondary | ICD-10-CM | POA: Diagnosis not present

## 2023-05-10 LAB — BASIC METABOLIC PANEL WITHOUT GFR
BUN/Creatinine Ratio: 17 (calc) (ref 6–22)
BUN: 26 mg/dL — ABNORMAL HIGH (ref 7–25)
CO2: 27 mmol/L (ref 20–32)
Calcium: 9.7 mg/dL (ref 8.6–10.3)
Chloride: 103 mmol/L (ref 98–110)
Creat: 1.51 mg/dL — ABNORMAL HIGH (ref 0.70–1.22)
Glucose, Bld: 125 mg/dL — ABNORMAL HIGH (ref 65–99)
Potassium: 5.2 mmol/L (ref 3.5–5.3)
Sodium: 139 mmol/L (ref 135–146)

## 2023-05-11 ENCOUNTER — Encounter: Payer: Self-pay | Admitting: Endocrinology

## 2023-05-11 ENCOUNTER — Telehealth: Payer: Self-pay

## 2023-05-11 ENCOUNTER — Other Ambulatory Visit: Payer: Self-pay | Admitting: Endocrinology

## 2023-05-11 DIAGNOSIS — R7989 Other specified abnormal findings of blood chemistry: Secondary | ICD-10-CM

## 2023-05-11 DIAGNOSIS — E119 Type 2 diabetes mellitus without complications: Secondary | ICD-10-CM

## 2023-05-11 NOTE — Telephone Encounter (Signed)
-----   Message from [place] Thapa sent at 05/11/2023  8:40 AM EDT ----- Please notify patient of labs reviewed kidney function test mainly the creatinine is slightly better, not worsening however is mildly elevated.  I would like to decrease the dose of Janumet  from 1 tablet 2 times a day to 1 tablet daily, due to elevated creatinine.  Advise for well hydration.  Check BMP with EGFR in 1 month.  Arrange for lab visit.  I have placed order.

## 2023-05-11 NOTE — Telephone Encounter (Signed)
 Patient given results and medication changes as directed by MD. No further questions at this time. Patient transferred to front desk to place lab appointment.

## 2023-05-14 DIAGNOSIS — H40032 Anatomical narrow angle, left eye: Secondary | ICD-10-CM | POA: Diagnosis not present

## 2023-05-14 DIAGNOSIS — H401230 Low-tension glaucoma, bilateral, stage unspecified: Secondary | ICD-10-CM | POA: Diagnosis not present

## 2023-05-24 ENCOUNTER — Other Ambulatory Visit: Payer: Self-pay

## 2023-05-24 MED ORDER — ACCU-CHEK GUIDE TEST VI STRP: 1.0000 | ORAL_STRIP | Freq: Every day | 0 refills | Status: DC

## 2023-06-08 ENCOUNTER — Other Ambulatory Visit

## 2023-06-08 DIAGNOSIS — E119 Type 2 diabetes mellitus without complications: Secondary | ICD-10-CM | POA: Diagnosis not present

## 2023-06-08 DIAGNOSIS — E063 Autoimmune thyroiditis: Secondary | ICD-10-CM | POA: Diagnosis not present

## 2023-06-08 DIAGNOSIS — D508 Other iron deficiency anemias: Secondary | ICD-10-CM | POA: Diagnosis not present

## 2023-06-09 LAB — BASIC METABOLIC PANEL WITHOUT GFR
BUN/Creatinine Ratio: 18 (calc) (ref 6–22)
BUN: 23 mg/dL (ref 7–25)
CO2: 25 mmol/L (ref 20–32)
Calcium: 9.4 mg/dL (ref 8.6–10.3)
Chloride: 105 mmol/L (ref 98–110)
Creat: 1.31 mg/dL — ABNORMAL HIGH (ref 0.70–1.22)
Glucose, Bld: 104 mg/dL — ABNORMAL HIGH (ref 65–99)
Potassium: 4.5 mmol/L (ref 3.5–5.3)
Sodium: 139 mmol/L (ref 135–146)

## 2023-06-09 LAB — HEMOGLOBIN A1C
Hgb A1c MFr Bld: 6.6 % — ABNORMAL HIGH (ref <5.7)
Mean Plasma Glucose: 143 mg/dL
eAG (mmol/L): 7.9 mmol/L

## 2023-06-09 LAB — LIPID PANEL
Cholesterol: 142 mg/dL (ref <200)
HDL: 64 mg/dL (ref >=40)
LDL Cholesterol (Calc): 61 mg/dL
Non-HDL Cholesterol (Calc): 78 mg/dL (ref <130)
Total CHOL/HDL Ratio: 2.2 (calc) (ref <5.0)
Triglycerides: 90 mg/dL (ref <150)

## 2023-06-09 LAB — TSH: TSH: 0.94 m[IU]/L (ref 0.40–4.50)

## 2023-06-09 LAB — T4, FREE: Free T4: 1.5 ng/dL (ref 0.8–1.8)

## 2023-06-09 LAB — FERRITIN: Ferritin: 21 ng/mL — ABNORMAL LOW (ref 24–380)

## 2023-06-11 ENCOUNTER — Ambulatory Visit: Payer: Self-pay | Admitting: Endocrinology

## 2023-06-11 ENCOUNTER — Encounter: Payer: Self-pay | Admitting: Endocrinology

## 2023-06-11 DIAGNOSIS — E119 Type 2 diabetes mellitus without complications: Secondary | ICD-10-CM

## 2023-06-11 DIAGNOSIS — E063 Autoimmune thyroiditis: Secondary | ICD-10-CM

## 2023-06-11 DIAGNOSIS — D508 Other iron deficiency anemias: Secondary | ICD-10-CM

## 2023-06-11 DIAGNOSIS — E559 Vitamin D deficiency, unspecified: Secondary | ICD-10-CM

## 2023-06-11 DIAGNOSIS — E538 Deficiency of other specified B group vitamins: Secondary | ICD-10-CM

## 2023-06-11 NOTE — Telephone Encounter (Signed)
 Please refer to my comment on the result notes.  Improvement on creatinine/renal function to the baseline of 1.31.  All other results are reassuring and stable and in acceptable range.  No change in the plan of the medications.

## 2023-06-18 DIAGNOSIS — D509 Iron deficiency anemia, unspecified: Secondary | ICD-10-CM | POA: Diagnosis not present

## 2023-06-18 DIAGNOSIS — E039 Hypothyroidism, unspecified: Secondary | ICD-10-CM | POA: Diagnosis not present

## 2023-06-18 DIAGNOSIS — E559 Vitamin D deficiency, unspecified: Secondary | ICD-10-CM | POA: Diagnosis not present

## 2023-06-18 DIAGNOSIS — E538 Deficiency of other specified B group vitamins: Secondary | ICD-10-CM | POA: Diagnosis not present

## 2023-06-18 DIAGNOSIS — E1122 Type 2 diabetes mellitus with diabetic chronic kidney disease: Secondary | ICD-10-CM | POA: Diagnosis not present

## 2023-06-18 DIAGNOSIS — E7800 Pure hypercholesterolemia, unspecified: Secondary | ICD-10-CM | POA: Diagnosis not present

## 2023-06-19 ENCOUNTER — Telehealth: Payer: Self-pay | Admitting: Pharmacist

## 2023-06-19 NOTE — Progress Notes (Signed)
   06/19/2023  Patient ID: James Juarez, male   DOB: 10/26/1940, 83 y.o.   MRN: [id number]  Patient appeared on insurance report for at-risk for failing 2025 metric: Medication Adherence for Cholesterol (MAC)    Medication: Rosuvastatin  10mg  Last fill date: 01/19/23 for 90DS  Recently filled 06/15/23 for 30DS. Will send message to Dr. Berry Bristol on 7/3 about sending refills.    Delvin File, PharmD [facility]  Phone Number: [phone]

## 2023-06-20 DIAGNOSIS — Z79899 Other long term (current) drug therapy: Secondary | ICD-10-CM | POA: Diagnosis not present

## 2023-06-20 DIAGNOSIS — D509 Iron deficiency anemia, unspecified: Secondary | ICD-10-CM | POA: Diagnosis not present

## 2023-06-20 DIAGNOSIS — N1831 Chronic kidney disease, stage 3a: Secondary | ICD-10-CM | POA: Diagnosis not present

## 2023-06-20 DIAGNOSIS — E7800 Pure hypercholesterolemia, unspecified: Secondary | ICD-10-CM | POA: Diagnosis not present

## 2023-06-20 DIAGNOSIS — I10 Essential (primary) hypertension: Secondary | ICD-10-CM | POA: Diagnosis not present

## 2023-06-20 DIAGNOSIS — E1122 Type 2 diabetes mellitus with diabetic chronic kidney disease: Secondary | ICD-10-CM | POA: Diagnosis not present

## 2023-06-20 DIAGNOSIS — E039 Hypothyroidism, unspecified: Secondary | ICD-10-CM | POA: Diagnosis not present

## 2023-06-20 DIAGNOSIS — Z1331 Encounter for screening for depression: Secondary | ICD-10-CM | POA: Diagnosis not present

## 2023-06-20 DIAGNOSIS — Z0001 Encounter for general adult medical examination with abnormal findings: Secondary | ICD-10-CM | POA: Diagnosis not present

## 2023-06-27 ENCOUNTER — Other Ambulatory Visit: Payer: Self-pay | Admitting: Endocrinology

## 2023-06-27 NOTE — Telephone Encounter (Signed)
 Refill request complete

## 2023-07-05 ENCOUNTER — Other Ambulatory Visit: Payer: Self-pay

## 2023-07-05 DIAGNOSIS — E7800 Pure hypercholesterolemia, unspecified: Secondary | ICD-10-CM

## 2023-07-05 MED ORDER — ROSUVASTATIN CALCIUM 10 MG PO TABS: 10.0000 mg | ORAL_TABLET | Freq: Every day | ORAL | 1 refills | Status: DC

## 2023-10-04 DIAGNOSIS — L989 Disorder of the skin and subcutaneous tissue, unspecified: Secondary | ICD-10-CM | POA: Diagnosis not present

## 2023-10-04 DIAGNOSIS — H6121 Impacted cerumen, right ear: Secondary | ICD-10-CM | POA: Diagnosis not present

## 2023-10-08 ENCOUNTER — Other Ambulatory Visit

## 2023-10-11 ENCOUNTER — Ambulatory Visit: Admitting: Endocrinology

## 2023-10-15 ENCOUNTER — Other Ambulatory Visit

## 2023-10-15 DIAGNOSIS — D508 Other iron deficiency anemias: Secondary | ICD-10-CM | POA: Diagnosis not present

## 2023-10-15 DIAGNOSIS — E538 Deficiency of other specified B group vitamins: Secondary | ICD-10-CM | POA: Diagnosis not present

## 2023-10-16 ENCOUNTER — Ambulatory Visit: Payer: Self-pay | Admitting: Endocrinology

## 2023-10-16 LAB — HEMOGLOBIN A1C
Hgb A1c MFr Bld: 6.7 % — ABNORMAL HIGH (ref <5.7)
Mean Plasma Glucose: 146 mg/dL
eAG (mmol/L): 8.1 mmol/L

## 2023-10-16 LAB — BASIC METABOLIC PANEL WITH GFR
BUN/Creatinine Ratio: 19 (calc) (ref 6–22)
BUN: 26 mg/dL — ABNORMAL HIGH (ref 7–25)
CO2: 26 mmol/L (ref 20–32)
Calcium: 9.3 mg/dL (ref 8.6–10.3)
Chloride: 104 mmol/L (ref 98–110)
Creat: 1.34 mg/dL — ABNORMAL HIGH (ref 0.70–1.22)
Glucose, Bld: 129 mg/dL — ABNORMAL HIGH (ref 65–99)
Potassium: 4.3 mmol/L (ref 3.5–5.3)
Sodium: 137 mmol/L (ref 135–146)
eGFR: 52 mL/min/1.73m2 — ABNORMAL LOW (ref >=60)

## 2023-10-16 LAB — T4, FREE: Free T4: 1.4 ng/dL (ref 0.8–1.8)

## 2023-10-16 LAB — TSH: TSH: 1.85 m[IU]/L (ref 0.40–4.50)

## 2023-10-16 LAB — FERRITIN: Ferritin: 30 ng/mL (ref 24–380)

## 2023-10-16 LAB — VITAMIN B12: Vitamin B-12: 1412 pg/mL — ABNORMAL HIGH (ref 200–1100)

## 2023-10-18 ENCOUNTER — Other Ambulatory Visit

## 2023-10-18 ENCOUNTER — Ambulatory Visit: Admitting: Endocrinology

## 2023-10-18 ENCOUNTER — Encounter: Payer: Self-pay | Admitting: Endocrinology

## 2023-10-18 VITALS — BP 118/62 | HR 70 | Resp 20 | Ht 67.0 in | Wt 169.2 lb

## 2023-10-18 DIAGNOSIS — E063 Autoimmune thyroiditis: Secondary | ICD-10-CM | POA: Diagnosis not present

## 2023-10-18 DIAGNOSIS — E119 Type 2 diabetes mellitus without complications: Secondary | ICD-10-CM

## 2023-10-18 DIAGNOSIS — E7800 Pure hypercholesterolemia, unspecified: Secondary | ICD-10-CM

## 2023-10-18 DIAGNOSIS — D508 Other iron deficiency anemias: Secondary | ICD-10-CM | POA: Diagnosis not present

## 2023-10-18 DIAGNOSIS — E538 Deficiency of other specified B group vitamins: Secondary | ICD-10-CM | POA: Diagnosis not present

## 2023-10-18 DIAGNOSIS — E559 Vitamin D deficiency, unspecified: Secondary | ICD-10-CM | POA: Diagnosis not present

## 2023-10-18 DIAGNOSIS — Z7984 Long term (current) use of oral hypoglycemic drugs: Secondary | ICD-10-CM

## 2023-10-18 LAB — CBC WITH DIFFERENTIAL/PLATELET
Absolute Lymphocytes: 2897 {cells}/uL (ref 850–3900)
Absolute Monocytes: 660 {cells}/uL (ref 200–950)
Basophils Absolute: 28 {cells}/uL (ref 0–200)
Basophils Relative: 0.4 %
Eosinophils Absolute: 249 {cells}/uL (ref 15–500)
Eosinophils Relative: 3.5 %
HCT: 37.1 % — ABNORMAL LOW (ref 38.5–50.0)
Hemoglobin: 12.3 g/dL — ABNORMAL LOW (ref 13.2–17.1)
MCH: 30.2 pg (ref 27.0–33.0)
MCHC: 33.2 g/dL (ref 32.0–36.0)
MCV: 91.2 fL (ref 80.0–100.0)
MPV: 9.9 fL (ref 7.5–12.5)
Monocytes Relative: 9.3 %
Neutro Abs: 3266 {cells}/uL (ref 1500–7800)
Neutrophils Relative %: 46 %
Platelets: 214 Thousand/uL (ref 140–400)
RBC: 4.07 Million/uL — ABNORMAL LOW (ref 4.20–5.80)
RDW: 11.7 % (ref 11.0–15.0)
Total Lymphocyte: 40.8 %
WBC: 7.1 Thousand/uL (ref 3.8–10.8)

## 2023-10-18 MED ORDER — LEVOTHYROXINE SODIUM 75 MCG PO TABS: ORAL_TABLET | ORAL | 3 refills | Status: DC

## 2023-10-18 MED ORDER — GLIPIZIDE ER 5 MG PO TB24: 5.0000 mg | ORAL_TABLET | Freq: Two times a day (BID) | ORAL | 3 refills | Status: AC

## 2023-10-18 MED ORDER — JANUMET 50-1000 MG PO TABS: 1.0000 | ORAL_TABLET | Freq: Every day | ORAL | 3 refills | Status: AC

## 2023-10-18 NOTE — Progress Notes (Unsigned)
 Outpatient Endocrinology Note [place] Geovana Gebel, MD  10/19/23  Patient's Name: James Juarez    DOB: Jul 07, 1940    MRN: [id number]                                                    REASON OF VISIT: Follow-up for type 2 diabetes mellitus  PCP: Regino Slater, MD  HISTORY OF PRESENT ILLNESS:   James Juarez is a 83 y.o. old male with past medical history listed below, is here for follow up for type 2 diabetes mellitus / hypothyroidism.    Pertinent Diabetes History: Patient has longstanding history of type 2 diabetes mellitus.  He has well-controlled type 2 diabetes mellitus.  Chronic Diabetes Complications : Retinopathy: no. Last ophthalmology exam was done on annually.  Nephropathy: CKD IIIa, on losartan .  No microalbuminuria. Peripheral neuropathy: Not as much sensitivity and tingling in the feet.   Coronary artery disease: no Stroke: no, h/o subdural hematoma 2013.   Relevant comorbidities and cardiovascular risk factors: Obesity: no Body mass index is 26.5 kg/m.  Hypertension: yes Hyperlipidemia. Yes, on a statin.  Current / Home Diabetic regimen includes: Glipizide  extended release 5 mg 2 times a day with breakfast and supper. Janumet  50/1000 mg 1 tablet daily with supper. Not taking pioglitazone  anymore.  Prior diabetic medications: Actos /pioglitazone  stopped due to pedal edema.  Glycemic data: Glucometer Accu-Chek Aviva, not able to download and review glucose data and not able to review from the meter directly.  He reports blood sugar mostly in the range of 110-150.  Blood sugar in the early morning usually 120s and at bedtime sometimes up to 150.  He has been checking blood sugar in the morning fasting and at bedtime.  Hypoglycemia: Patient has no hypoglycemic episodes. Patient has hypoglycemia awareness.  Factors modifying glucose control: 1.  Diabetic diet assessment: Well-controlled diet rarely getting more carbohydrates or sweets.  2.  Staying active or exercising:  walking at least 4 -6 days a week.  3.  Medication compliance: compliant all of the time.  # Primary hypothyroidism : -Patient has longstanding history of mild hypothyroidism since 1995.  He has been on levothyroxine  stable dose 75 mcg daily with stable thyroid  levels.  # Iron deficiency anemia : He is on oral fusion iron supplement, managed by primary care provider.  # He has been taking vitamin B12 supplement for pernicious anemia and level is normal.  # Vitamin D  deficiency : He is on vitamin D  supplement with normal vitamin D  level.  Interval history  Diabetes regimen as reviewed above.  He is no longer taking pioglitazone  and his pedal edema has improved.  Recent laboratory results reviewed relatively stable renal function and normal electrolytes.  Hemoglobin A1c 6.7%.  Ferritin normal at 30.  Vitamin B12 elevated.  Normal thyroid  function test.   REVIEW OF SYSTEMS As per history of present illness.   PAST MEDICAL HISTORY: Past Medical History:  Diagnosis Date   Asthma    Diabetes mellitus    Diverticular disease    Hypertension    Subdural hematoma [facility]) May 2013   bilateral     PAST SURGICAL HISTORY: Past Surgical History:  Procedure Laterality Date   BURR HOLE  05/22/2011   Procedure: SOLMON HERRLICH;  Surgeon: Arley SHAUNNA Helling, MD;  Location: [facility];  Service: Neurosurgery;  Laterality: Right;  Right [facility] for evacuation of subdural hematoma   BURR HOLE  05/23/2011   Procedure: SOLMON HERRLICH;  Surgeon: Arley SHAUNNA Helling, MD;  Location: [facility];  Service: Neurosurgery;  Laterality: Left;  Left SOLMON Dakin   CRANIOTOMY  06/14/2011   Procedure: CRANIOTOMY HEMATOMA EVACUATION SUBDURAL;  Surgeon: Arley SHAUNNA Helling, MD;  Location: [facility];  Service: Neurosurgery;  Laterality: N/A;  Left Craniotomy for subdural hematoma   CRANIOTOMY  06/23/2011   Procedure: CRANIOTOMY HEMATOMA EVACUATION SUBDURAL;  Surgeon: Arley SHAUNNA Helling, MD;  Location: [facility];  Service: Neurosurgery;  Laterality:  Left;  Redo Craniotomy for Subdural Hematoma   HERNIA REPAIR     INGUINAL HERNIA REPAIR Left 09/27/2016   Procedure: LAPAROSCOPIC LEFT INGUINAL HERNIA REPAIR;  Surgeon: Signe Mitzie LABOR, MD;  Location: [facility];  Service: General;  Laterality: Left;   INSERTION OF MESH Left 09/27/2016   Procedure: INSERTION OF MESH;  Surgeon: Signe Mitzie LABOR, MD;  Location: [facility];  Service: General;  Laterality: Left;    ALLERGIES: No Known Allergies  FAMILY HISTORY:  Family History  Problem Relation Age of Onset   Diabetes Mother    Diabetes Father    Heart attack Father    Diabetes Sister    Diabetes Brother    Heart attack Brother     SOCIAL HISTORY: Social History   Socioeconomic History   Marital status: Married    Spouse name: Not on file   Number of children: Not on file   Years of education: Not on file   Highest education level: Not on file  Occupational History   Not on file  Tobacco Use   Smoking status: Former    Current packs/day: 0.00    Average packs/day: 1 pack/day for 15.0 years (15.0 ttl pk-yrs)    Types: Cigarettes    Start date: 41    Quit date: 44    Years since quitting: 48.8   Smokeless tobacco: Never  Vaping Use   Vaping status: Never Used  Substance and Sexual Activity   Alcohol use: No   Drug use: No   Sexual activity: Not Currently  Other Topics Concern   Not on file  Social History Narrative   Not on file   Social Drivers of Health   Financial Resource Strain: Not on file  Food Insecurity: No Food Insecurity (10/13/2021)   Hunger Vital Sign    Worried About Running Out of Food in the Last Year: Never true    Ran Out of Food in the Last Year: Never true  Transportation Needs: No Transportation Needs (10/13/2021)   PRAPARE - Administrator, Civil Service (Medical): No    Lack of Transportation (Non-Medical): No  Physical Activity: Not on file  Stress: Not on file  Social Connections: Not on file    MEDICATIONS:  Current  Outpatient Medications  Medication Sig Dispense Refill   ACCU-CHEK FASTCLIX LANCETS MISC Use to check blood sugar 2 times per day dx code E11.65 102 each 3   acetaminophen  (TYLENOL ) 325 MG tablet Take 325 mg by mouth every 6 (six) hours as needed. For pain     albuterol  (PROVENTIL  HFA;VENTOLIN  HFA) 108 (90 BASE) MCG/ACT inhaler Inhale 2 puffs into the lungs every 6 (six) hours as needed. For shortness of breath     amLODipine  (NORVASC ) 5 MG tablet TAKE 1 TABLET (5 MG TOTAL) BY MOUTH DAILY. 90 tablet 3   aspirin  EC 81  MG tablet Take 1 tablet (81 mg total) by mouth daily. Swallow whole.     Blood Glucose Monitoring Suppl (ACCU-CHEK AVIVA PLUS) w/Device KIT Use to check blood sugars 2 times daily, Dx Code E11.65 1 kit 1   cholecalciferol (VITAMIN D3) 25 MCG (1000 UNIT) tablet Take 1,000 Units by mouth 3 (three) times a week.     clobetasol  (TEMOVATE ) 0.05 % GEL Apply 1 application topically daily as needed. For face.     dorzolamide-timolol (COSOPT) 22.3-6.8 MG/ML ophthalmic solution      fexofenadine (ALLEGRA) 180 MG tablet Take 180 mg by mouth daily as needed. For allergies     glucose blood (ACCU-CHEK GUIDE TEST) test strip 1 EACH BY OTHER ROUTE DAILY. USE AS INSTRUCTED 100 strip 3   hydroxypropyl methylcellulose (ISOPTO TEARS) 2.5 % ophthalmic solution Place 1 drop into both eyes at bedtime.      l-methylfolate-B6-B12 (METANX) 3-35-2 MG TABS tablet Take 1 tablet by mouth 2 (two) times daily. 180 tablet 5   latanoprost (XALATAN) 0.005 % ophthalmic solution SMARTSIG:1 Drop(s) In Eye(s) Every Evening     losartan  (COZAAR ) 25 MG tablet Take 25 mg by mouth daily.     Multiple Vitamin (MULTIVITAMIN) capsule Take 1 capsule by mouth daily.     rosuvastatin  (CRESTOR ) 10 MG tablet Take 1 tablet (10 mg total) by mouth daily. 90 tablet 1   glipiZIDE  (GLUCOTROL  XL) 5 MG 24 hr tablet Take 1 tablet (5 mg total) by mouth in the morning and at bedtime. 180 tablet 3   levothyroxine  (SYNTHROID ) 75 MCG tablet TAKE  1 TABLET BY MOUTH EVERY DAY BEFORE BREAKFAST 90 tablet 3   sitaGLIPtin -metformin  (JANUMET ) 50-1000 MG tablet Take 1 tablet by mouth daily. 90 tablet 3   No current facility-administered medications for this visit.    PHYSICAL EXAM: Vitals:   10/18/23 1556  BP: 118/62  Pulse: 70  Resp: 20  SpO2: 99%  Weight: 169 lb 3.2 oz (76.7 kg)  Height: 5' 7 (1.702 m)    Body mass index is 26.5 kg/m.  Wt Readings from Last 3 Encounters:  10/18/23 169 lb 3.2 oz (76.7 kg)  04/11/23 181 lb 9.6 oz (82.4 kg)  10/11/22 175 lb 12.8 oz (79.7 kg)    General: Well developed, well nourished male in no apparent distress.  HEENT: AT/[place], no external lesions.  Eyes: Conjunctiva clear and no icterus. Neck: Neck supple  Lungs: Respirations not labored Neurologic: Alert, oriented, normal speech Extremities / Skin: Dry. .  Psychiatric: Does not appear depressed or anxious  Diabetic Foot Exam - Simple   No data filed     LABS Reviewed Lab Results  Component Value Date   HGBA1C 6.7 (H) 10/15/2023   HGBA1C 6.6 (H) 06/08/2023   HGBA1C 6.6 (H) 04/04/2023   No results found for: FRUCTOSAMINE Lab Results  Component Value Date   CHOL 142 06/08/2023   HDL 64 06/08/2023   LDLCALC 61 06/08/2023   TRIG 90 06/08/2023   CHOLHDL 2.2 06/08/2023   Lab Results  Component Value Date   MICRALBCREAT NOTE 04/04/2023   Lab Results  Component Value Date   CREATININE 1.34 (H) 10/15/2023   Lab Results  Component Value Date   GFR 49.59 (L) 10/09/2022    ASSESSMENT / PLAN  1. Diabetes mellitus type 2 in nonobese (HCC)   2. Other iron deficiency anemia   3. Acquired autoimmune hypothyroidism   4. Vitamin B12 deficiency   5. Vitamin D  deficiency   6. Pure  hypercholesterolemia     Diabetes Mellitus type 2, complicated by CKD.  - Diabetic status / severity: Fair controlled.   Lab Results  Component Value Date   HGBA1C 6.7 (H) 10/15/2023    - Hemoglobin A1c goal : <7%  Controlled type 2  diabetes mellitus.  - Medications: See below.  No change. Continue glipizide  extended release 5 mg 2 times a day with breakfast and supper.   Continue Janumet  50/1000 mg 1 tablet daily.     - Home glucose testing: check in the morning fasting daily and occasionally at bedtime.    - Discussed/ Gave Hypoglycemia treatment plan.  # Consult : not required at this time.   # Annual urine for microalbuminuria/ creatinine ratio, no microalbuminuria currently, continue ACE/ARB / losartan :.  Last  Lab Results  Component Value Date   MICRALBCREAT NOTE 04/04/2023    # Foot check nightly / neuropathy.  # Annual dilated diabetic eye exams.   2. Blood pressure  -  BP Readings from Last 1 Encounters:  10/18/23 118/62    - Control is in target.  - No change in current plans.  He is currently taking amlodipine  2.5 mg daily.  Dose was decreased due to pedal edema.  3. Lipid status / Hyperlipidemia - Last  Lab Results  Component Value Date   LDLCALC 61 06/08/2023   - Continue rosuvastatin  10 mg daily.  Managed by cardiology.  # Primary hypothyroidism -Recent thyroid  lab normal.  Thyroid  function test every 6 months for now. -Continue current dose of levothyroxine  75 mcg daily.  # Iron deficiency anemia. -He had low ferritin in the past.  He has been on he is on iron supplement managed by primary care provider. -He reports he had workup for chronic iron deficiency anemia including normal  colonoscopy in the past. -I have asked patient to discuss with primary care provider for management including iron supplement and required evaluation of iron deficiency anemia. -He like to check ferritin and CBC with the visit in this clinic every 90-month as he reports he has been following with primary care provider alternating with me every 6 months. -Recent ferritin normal.  Will check CBC in the clinic today.  # Vitamin D  deficiency: -Normal vitamin D  level in March.  Continue current dose of  vitamin D  supplement.  Annual vitamin D  lab.  # Vitamin B12 supplement /pernicious anemia : Recent vitamin B12 levels elevated, hold a vitamin B12 supplement for now.  Being managed by primary care provider.  Will check hemoglobin A1c, CBC, CMP, lipid panel, urine microalbumin creatinine ratio, vitamin B12, ferritin, vitamin D  in 6 months prior to follow-up visit.  Diagnoses and all orders for this visit:  Diabetes mellitus type 2 in nonobese (HCC) -     glipiZIDE  (GLUCOTROL  XL) 5 MG 24 hr tablet; Take 1 tablet (5 mg total) by mouth in the morning and at bedtime. -     sitaGLIPtin -metformin  (JANUMET ) 50-1000 MG tablet; Take 1 tablet by mouth daily. -     Hemoglobin A1c -     Microalbumin / creatinine urine ratio -     Lipid panel -     CBC with Differential/Platelet -     Comprehensive metabolic panel with GFR  Other iron deficiency anemia -     CBC with Differential/Platelet -     Ferritin  Acquired autoimmune hypothyroidism -     levothyroxine  (SYNTHROID ) 75 MCG tablet; TAKE 1 TABLET BY MOUTH EVERY DAY BEFORE BREAKFAST  Vitamin  B12 deficiency -     Vitamin B12 -     Vitamin B12  Vitamin D  deficiency -     VITAMIN D  25 Hydroxy (Vit-D Deficiency, Fractures)  Pure hypercholesterolemia -     Lipid panel    DISPOSITION Follow up in clinic in 6 months suggested.  Labs today and prior to follow-up visit..   All questions answered and patient verbalized understanding of the plan.  [place] Ada Woodbury, MD [facility] Endocrinology [facility] [address] [place], [address] [place], [place] [address] Phone # [phone]  At least part of this note was generated using voice recognition software. Inadvertent word errors may have occurred, which were not recognized during the proofreading process.

## 2023-10-19 ENCOUNTER — Ambulatory Visit: Payer: Self-pay | Admitting: Endocrinology

## 2023-10-22 ENCOUNTER — Other Ambulatory Visit: Payer: Self-pay

## 2023-10-22 MED ORDER — ACCU-CHEK GUIDE ME W/DEVICE KIT: PACK | 0 refills | Status: DC

## 2023-11-04 ENCOUNTER — Other Ambulatory Visit: Payer: Self-pay | Admitting: Endocrinology

## 2023-11-05 DIAGNOSIS — E119 Type 2 diabetes mellitus without complications: Secondary | ICD-10-CM | POA: Diagnosis not present

## 2023-11-05 DIAGNOSIS — H524 Presbyopia: Secondary | ICD-10-CM | POA: Diagnosis not present

## 2023-11-05 DIAGNOSIS — H401230 Low-tension glaucoma, bilateral, stage unspecified: Secondary | ICD-10-CM | POA: Diagnosis not present

## 2023-11-05 DIAGNOSIS — H25812 Combined forms of age-related cataract, left eye: Secondary | ICD-10-CM | POA: Diagnosis not present

## 2023-11-05 DIAGNOSIS — H40032 Anatomical narrow angle, left eye: Secondary | ICD-10-CM | POA: Diagnosis not present

## 2023-11-06 DIAGNOSIS — R0789 Other chest pain: Secondary | ICD-10-CM | POA: Diagnosis not present

## 2023-11-18 ENCOUNTER — Other Ambulatory Visit: Payer: Self-pay | Admitting: Endocrinology

## 2023-12-29 ENCOUNTER — Other Ambulatory Visit: Payer: Self-pay | Admitting: Cardiology

## 2023-12-29 DIAGNOSIS — E7800 Pure hypercholesterolemia, unspecified: Secondary | ICD-10-CM

## 2024-01-07 ENCOUNTER — Encounter: Payer: Self-pay | Admitting: Endocrinology

## 2024-01-07 NOTE — Telephone Encounter (Signed)
 Blood sugar in the morning is slightly high and bedtime blood sugar looks normal.  It is preferred to have morning blood sugar less than 150 however mildly high blood sugar is reasonable and okay.  Diabetes control we need to look into the overall context of age and other medical problems.  It is okay to have mildly high blood sugar then low blood sugar problem at your age.  I had stopped pioglitazone  / Actos  in the past due to leg edema.  With the current medications, I do not want to increase the dose of glipizide  which can potentially cause low blood sugar problem.  I would like to reschedule for early visit, okay to schedule follow-up visit with me in January.  I would like to review glucose data in detail and make the additional plan.  For now it is okay to continue current dose of diabetic medications.  Please reschedule follow-up visit with me around mid of January or after.  Saprina Chuong, MD [facility] Endocrinology [facility] [address] [place], [address] [place], [place] [address] Phone # [phone]

## 2024-01-08 ENCOUNTER — Encounter: Payer: Self-pay | Admitting: Endocrinology

## 2024-01-15 ENCOUNTER — Other Ambulatory Visit: Payer: Self-pay | Admitting: Endocrinology

## 2024-01-15 DIAGNOSIS — E063 Autoimmune thyroiditis: Secondary | ICD-10-CM

## 2024-02-06 ENCOUNTER — Ambulatory Visit: Admitting: Endocrinology

## 2024-02-21 ENCOUNTER — Ambulatory Visit: Admitting: Endocrinology

## 2024-04-17 ENCOUNTER — Other Ambulatory Visit

## 2024-04-21 ENCOUNTER — Ambulatory Visit: Admitting: Endocrinology
